# Patient Record
Sex: Male | Born: 1980 | Race: White | Hispanic: No
Health system: Midwestern US, Community
[De-identification: ages and names within clinical notes are randomized; demographics above are authoritative.]

## PROBLEM LIST (undated history)

## (undated) DIAGNOSIS — D6859 Other primary thrombophilia: Secondary | ICD-10-CM

## (undated) DIAGNOSIS — R45851 Suicidal ideations: Secondary | ICD-10-CM

## (undated) DIAGNOSIS — R569 Unspecified convulsions: Secondary | ICD-10-CM

## (undated) HISTORY — PX: INSERTION OF VENA CAVA FILTER: SHX5871

---

## 2005-08-20 ENCOUNTER — Inpatient Hospital Stay: Payer: Self-pay | Admitting: Internal Medicine

## 2005-08-20 ENCOUNTER — Other Ambulatory Visit: Payer: Self-pay

## 2005-08-28 ENCOUNTER — Inpatient Hospital Stay: Payer: Self-pay | Admitting: Psychiatry

## 2005-09-06 ENCOUNTER — Inpatient Hospital Stay: Payer: Self-pay | Admitting: Internal Medicine

## 2012-02-14 ENCOUNTER — Inpatient Hospital Stay: Payer: Self-pay | Admitting: Psychiatry

## 2012-02-15 ENCOUNTER — Inpatient Hospital Stay: Payer: Self-pay | Admitting: Internal Medicine

## 2012-02-15 LAB — COMPREHENSIVE METABOLIC PANEL
Alkaline Phosphatase: 107 U/L (ref 50–136)
BUN: 15 mg/dL (ref 7–18)
Bilirubin,Total: 0.2 mg/dL (ref 0.2–1.0)
Chloride: 109 mmol/L — ABNORMAL HIGH (ref 98–107)
Creatinine: 0.95 mg/dL (ref 0.60–1.30)
Glucose: 109 mg/dL — ABNORMAL HIGH (ref 65–99)
SGOT(AST): 29 U/L (ref 15–37)
SGPT (ALT): 25 U/L (ref 12–78)
Sodium: 142 mmol/L (ref 136–145)
Total Protein: 7.9 g/dL (ref 6.4–8.2)

## 2012-02-15 LAB — CBC WITH DIFFERENTIAL/PLATELET
Basophil #: 0 10*3/uL (ref 0.0–0.1)
Eosinophil #: 0.2 10*3/uL (ref 0.0–0.7)
HCT: 35.7 % — ABNORMAL LOW (ref 40.0–52.0)
Lymphocyte #: 1.3 10*3/uL (ref 1.0–3.6)
Lymphocyte %: 16.6 %
MCHC: 32 g/dL (ref 32.0–36.0)
Monocyte %: 10.5 %
Neutrophil #: 5.5 10*3/uL (ref 1.4–6.5)
Platelet: 284 10*3/uL (ref 150–440)
RDW: 16.5 % — ABNORMAL HIGH (ref 11.5–14.5)

## 2012-02-15 LAB — CK: CK, Total: 193 U/L (ref 35–232)

## 2012-02-15 LAB — TROPONIN I: Troponin-I: <0.02 ng/mL

## 2012-02-16 LAB — URINALYSIS, COMPLETE
Bacteria: NONE SEEN
Blood: NEGATIVE
Leukocyte Esterase: NEGATIVE
Nitrite: NEGATIVE
Ph: 8 (ref 4.5–8.0)
Protein: NEGATIVE
RBC,UR: 9 /HPF (ref 0–5)
Squamous Epithelial: NONE SEEN
WBC UR: 1 /HPF (ref 0–5)

## 2012-02-16 LAB — DRUG SCREEN, URINE
Amphetamines, Ur Screen: NEGATIVE (ref ?–1000)
Benzodiazepine, Ur Scrn: POSITIVE (ref ?–200)
Cocaine Metabolite,Ur ~~LOC~~: POSITIVE (ref ?–300)
MDMA (Ecstasy)Ur Screen: NEGATIVE (ref ?–500)
Tricyclic, Ur Screen: NEGATIVE (ref ?–1000)

## 2012-02-17 ENCOUNTER — Inpatient Hospital Stay: Payer: Self-pay | Admitting: Psychiatry

## 2012-03-07 ENCOUNTER — Inpatient Hospital Stay (HOSPITAL_COMMUNITY)
Admission: EM | Admit: 2012-03-07 | Discharge: 2012-03-18 | DRG: 101 | Disposition: A | Discharge status: Home / Self Care | Payer: MEDICAID | Attending: Family Medicine | Admitting: Family Medicine

## 2012-03-07 ENCOUNTER — Encounter (HOSPITAL_COMMUNITY): Payer: Self-pay | Admitting: *Deleted

## 2012-03-07 ENCOUNTER — Emergency Department (HOSPITAL_COMMUNITY): Payer: Self-pay

## 2012-03-07 DIAGNOSIS — Z765 Malingerer [conscious simulation]: Secondary | ICD-10-CM

## 2012-03-07 DIAGNOSIS — G40901 Epilepsy, unspecified, not intractable, with status epilepticus: Secondary | ICD-10-CM

## 2012-03-07 DIAGNOSIS — R569 Unspecified convulsions: Secondary | ICD-10-CM

## 2012-03-07 DIAGNOSIS — G930 Cerebral cysts: Secondary | ICD-10-CM | POA: Diagnosis present

## 2012-03-07 DIAGNOSIS — G40909 Epilepsy, unspecified, not intractable, without status epilepticus: Secondary | ICD-10-CM

## 2012-03-07 DIAGNOSIS — F1910 Other psychoactive substance abuse, uncomplicated: Secondary | ICD-10-CM | POA: Diagnosis present

## 2012-03-07 DIAGNOSIS — L0390 Cellulitis, unspecified: Secondary | ICD-10-CM

## 2012-03-07 DIAGNOSIS — Z91199 Patient's noncompliance with other medical treatment and regimen due to unspecified reason: Secondary | ICD-10-CM

## 2012-03-07 DIAGNOSIS — IMO0002 Reserved for concepts with insufficient information to code with codable children: Secondary | ICD-10-CM | POA: Diagnosis not present

## 2012-03-07 DIAGNOSIS — Z888 Allergy status to other drugs, medicaments and biological substances status: Secondary | ICD-10-CM

## 2012-03-07 DIAGNOSIS — F1020 Alcohol dependence, uncomplicated: Secondary | ICD-10-CM

## 2012-03-07 DIAGNOSIS — Z885 Allergy status to narcotic agent status: Secondary | ICD-10-CM

## 2012-03-07 DIAGNOSIS — G40401 Other generalized epilepsy and epileptic syndromes, not intractable, with status epilepticus: Principal | ICD-10-CM | POA: Diagnosis present

## 2012-03-07 DIAGNOSIS — D6859 Other primary thrombophilia: Secondary | ICD-10-CM | POA: Diagnosis present

## 2012-03-07 DIAGNOSIS — Z79899 Other long term (current) drug therapy: Secondary | ICD-10-CM

## 2012-03-07 DIAGNOSIS — G894 Chronic pain syndrome: Secondary | ICD-10-CM | POA: Diagnosis present

## 2012-03-07 DIAGNOSIS — I82629 Acute embolism and thrombosis of deep veins of unspecified upper extremity: Secondary | ICD-10-CM | POA: Diagnosis not present

## 2012-03-07 DIAGNOSIS — F17200 Nicotine dependence, unspecified, uncomplicated: Secondary | ICD-10-CM | POA: Diagnosis present

## 2012-03-07 DIAGNOSIS — R52 Pain, unspecified: Secondary | ICD-10-CM

## 2012-03-07 DIAGNOSIS — I82409 Acute embolism and thrombosis of unspecified deep veins of unspecified lower extremity: Secondary | ICD-10-CM

## 2012-03-07 DIAGNOSIS — Z9119 Patient's noncompliance with other medical treatment and regimen: Secondary | ICD-10-CM

## 2012-03-07 HISTORY — DX: Unspecified convulsions: R56.9

## 2012-03-07 HISTORY — DX: Other primary thrombophilia: D68.59

## 2012-03-07 LAB — CBC WITH DIFFERENTIAL/PLATELET
Eosinophils Absolute: 0.1 10*3/uL (ref 0.0–0.7)
Eosinophils Relative: 2 % (ref 0–5)
HCT: 34.6 % — ABNORMAL LOW (ref 39.0–52.0)
Hemoglobin: 10.9 g/dL — ABNORMAL LOW (ref 13.0–17.0)
Lymphs Abs: 0.9 10*3/uL (ref 0.7–4.0)
MCH: 25.2 pg — ABNORMAL LOW (ref 26.0–34.0)
MCV: 80.1 fL (ref 78.0–100.0)
Monocytes Relative: 12 % (ref 3–12)
RBC: 4.32 MIL/uL (ref 4.22–5.81)

## 2012-03-07 LAB — BASIC METABOLIC PANEL
BUN: 16 mg/dL (ref 6–23)
CO2: 23 mEq/L (ref 19–32)
Calcium: 9.1 mg/dL (ref 8.4–10.5)
GFR calc non Af Amer: >90 mL/min (ref >90)
Glucose, Bld: 139 mg/dL — ABNORMAL HIGH (ref 70–99)

## 2012-03-07 LAB — RAPID URINE DRUG SCREEN, HOSP PERFORMED
Amphetamines: NOT DETECTED
Barbiturates: NOT DETECTED
Cocaine: NOT DETECTED
Tetrahydrocannabinol: NOT DETECTED

## 2012-03-07 LAB — GLUCOSE, CAPILLARY: Glucose-Capillary: 144 mg/dL — ABNORMAL HIGH (ref 70–99)

## 2012-03-07 MED ORDER — ACETAMINOPHEN 500 MG PO TABS
1000.0000 mg | ORAL_TABLET | Freq: Once | ORAL | Status: DC
  Filled 2012-03-07: qty 2

## 2012-03-07 MED ORDER — ROCURONIUM BROMIDE 50 MG/5ML IV SOLN: 0.6000 mg/kg | Freq: Once | INTRAVENOUS | Status: DC

## 2012-03-07 MED ORDER — LORAZEPAM 2 MG/ML IJ SOLN
2.0000 mg | Freq: Once | INTRAMUSCULAR | Status: AC
  Administered 2012-03-07 (×2): 2 mg via INTRAVENOUS

## 2012-03-07 MED ORDER — ONDANSETRON HCL 4 MG/2ML IJ SOLN
4.0000 mg | Freq: Once | INTRAMUSCULAR | Status: AC
  Administered 2012-03-07: 4 mg via INTRAVENOUS
  Filled 2012-03-07: qty 2

## 2012-03-07 MED ORDER — LORAZEPAM 2 MG/ML IJ SOLN: 2.0000 mg | INTRAMUSCULAR | Status: DC | PRN

## 2012-03-07 MED ORDER — PHENYTOIN SODIUM EXTENDED 100 MG PO CAPS
200.0000 mg | ORAL_CAPSULE | Freq: Once | ORAL | Status: AC
  Administered 2012-03-07: 200 mg via ORAL
  Filled 2012-03-07: qty 2

## 2012-03-07 MED ORDER — LORAZEPAM 2 MG/ML IJ SOLN
INTRAMUSCULAR | Status: AC
  Administered 2012-03-07: 2 mg via INTRAVENOUS
  Filled 2012-03-07: qty 1

## 2012-03-07 MED ORDER — SODIUM CHLORIDE 0.9 % IV SOLN
1000.0000 mg | INTRAVENOUS | Status: AC
  Administered 2012-03-07: 1000 mg via INTRAVENOUS
  Filled 2012-03-07: qty 10

## 2012-03-07 MED ORDER — HYDROMORPHONE HCL PF 1 MG/ML IJ SOLN
0.5000 mg | Freq: Once | INTRAMUSCULAR | Status: AC
  Administered 2012-03-07: 0.5 mg via INTRAVENOUS
  Filled 2012-03-07: qty 1

## 2012-03-07 MED ORDER — HYDROMORPHONE HCL PF 1 MG/ML IJ SOLN
1.0000 mg | Freq: Once | INTRAMUSCULAR | Status: AC
  Administered 2012-03-07: 1 mg via INTRAVENOUS
  Filled 2012-03-07: qty 1

## 2012-03-07 MED ORDER — ETOMIDATE 2 MG/ML IV SOLN: 20.0000 mg | Freq: Once | INTRAVENOUS | Status: DC

## 2012-03-07 MED ORDER — LORAZEPAM 2 MG/ML IJ SOLN: 2.0000 mg | Freq: Once | INTRAMUSCULAR | Status: DC

## 2012-03-07 NOTE — ED Provider Notes (Signed)
Pt with multiple seizures, not responding to PTN/keppra/benzos He started to seizure again while in CT imaging Will likely need airway management due to status epilepticus   Joya Gaskins, MD 03/07/12 2008

## 2012-03-07 NOTE — ED Provider Notes (Signed)
History     CSN: [id number]  Arrival date & time 03/07/12  1752   First MD Initiated Contact with Patient 03/07/12 1754      Chief Complaint  Patient presents with  . Seizures    (Consider location/radiation/quality/duration/timing/severity/associated sxs/prior treatment) HPI Comments: Patient reported to have hx of etoh abuse,  Released from rehab 1 week ago for narc abuse.  He was out with friends today and reported to have witnessed seizure lasting 1 min.  Patient found to be post ictal.  Patient had seizure, full body clonic/tonic lasting 30 sec with ems. Patient had 3 rd seizure lasting 30 sec,  He received 2.5mg  versed iv.  Patient had 4th seizure, same description.  Patient received additional 2.5mg  versed.  Patient did have incont of urine.  Patient has not had any additional seizures.  He also received 1mg  narcan w/o response.  No s/sx of trauma.  Patient has known hx of seizures.  Patient states he thinks he took seizure med late today.  Cbg 124  Patient is a 31 y.o. male presenting with seizures. The history is provided by the patient and the EMS personnel. No language interpreter was used.  Seizures  This is a recurrent problem. The current episode started less than 1 hour ago. The problem has been resolved. There were 4 to 5 seizures. The most recent episode lasted 30 to 120 seconds. Associated symptoms include patient experiences confusion. Pertinent negatives include no speech difficulty, no visual disturbance, no sore throat, no chest pain, no cough, no nausea, no vomiting and no diarrhea. Characteristics include bladder incontinence, rhythmic jerking and loss of consciousness. The episode was witnessed. The seizures did not continue in the ED. The seizure(s) had no focality. Possible causes include med or dosage change (states he took his meds late). Possible causes do not include change in alcohol use (last drink last night). There has been no fever. Medications administered  prior to arrival include diazepam IV.    History reviewed. No pertinent past medical history.  No past surgical history on file.  No family history on file.  History  Substance Use Topics  . Smoking status: Not on file  . Smokeless tobacco: Not on file  . Alcohol Use: Not on file      Review of Systems  Constitutional: Negative for diaphoresis, activity change and appetite change.  HENT: Negative for sore throat and neck pain.   Eyes: Negative for discharge and visual disturbance.  Respiratory: Negative for cough, choking and shortness of breath.   Cardiovascular: Negative for chest pain and leg swelling.  Gastrointestinal: Negative for nausea, vomiting, abdominal pain, diarrhea and constipation.  Genitourinary: Positive for bladder incontinence. Negative for dysuria and difficulty urinating.  Musculoskeletal: Negative for back pain and arthralgias.  Skin: Negative for color change and pallor.  Neurological: Positive for seizures and loss of consciousness. Negative for dizziness, speech difficulty and light-headedness.  Psychiatric/Behavioral: Positive for confusion. Negative for behavioral problems and agitation.  All other systems reviewed and are negative.    Allergies  Review of patient's allergies indicates not on file.  Home Medications  No current outpatient prescriptions on file.  There were no vitals taken for this visit.  Physical Exam  Constitutional: He is oriented to person, place, and time. He appears well-developed. No distress.  HENT:  Head: Normocephalic and atraumatic.  Mouth/Throat: No oropharyngeal exudate.  Eyes: EOM are normal. Pupils are equal, round, and reactive to light. Right eye exhibits no discharge. Left eye exhibits  no discharge.  Neck: Normal range of motion. Neck supple. No JVD present.  Cardiovascular: Normal rate, regular rhythm and normal heart sounds.   Pulmonary/Chest: Effort normal and breath sounds normal. No stridor. No  respiratory distress. He has no wheezes. He has no rales. He exhibits no tenderness.  Abdominal: Soft. Bowel sounds are normal. There is no tenderness. There is no guarding.  Genitourinary: Penis normal.  Musculoskeletal: Normal range of motion. He exhibits no edema and no tenderness.  Neurological: He is alert and oriented to person, place, and time. No cranial nerve deficit. He exhibits normal muscle tone.       Initially drowsy  Skin: Skin is warm and dry. He is not diaphoretic. No erythema. No pallor.  Psychiatric: He has a normal mood and affect. His behavior is normal. Judgment and thought content normal.    ED Course  Procedures (including critical care time)  Labs Reviewed  CBC WITH DIFFERENTIAL - Abnormal; Notable for the following:    Hemoglobin 10.9 (*)     HCT 34.6 (*)     MCH 25.2 (*)     All other components within normal limits  BASIC METABOLIC PANEL - Abnormal; Notable for the following:    Glucose, Bld 139 (*)     All other components within normal limits  PHENYTOIN LEVEL, TOTAL - Abnormal; Notable for the following:    Phenytoin Lvl 8.4 (*)     All other components within normal limits  GLUCOSE, CAPILLARY - Abnormal; Notable for the following:    Glucose-Capillary 144 (*)     All other components within normal limits  URINE RAPID DRUG SCREEN (HOSP PERFORMED) - Abnormal; Notable for the following:    Opiates POSITIVE (*)     Benzodiazepines POSITIVE (*)     All other components within normal limits  ETHANOL  GLUCOSE, CAPILLARY  HEPATIC FUNCTION PANEL   Ct Head Wo Contrast  03/07/2012  *RADIOLOGY REPORT*  Clinical Data: Seizure  CT HEAD WITHOUT CONTRAST  Technique:  Contiguous axial images were obtained from the base of the skull through the vertex without contrast.  Comparison: None.  Findings:  Ventricles are normal in size and configuration. There is a 3.2 x 1.1 cm arachnoid cyst in the anterior left temporal region.  There is no other mass.  There is no   hemorrhage, extra- axial fluid collection, or midline shift. Gray-white compartments are normal.  Bony calvarium appears intact.  The mastoid air cells are clear.  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  Study otherwise unremarkable.   Original Report Authenticated By: Bretta Bang, M.D.      1. Seizure     Angiocath insertion Performed by: Warrick Parisian  Consent: Verbal consent obtained. Risks and benefits: risks, benefits and alternatives were discussed Time out: Immediately prior to procedure a "time out" was called to verify the correct patient, procedure, equipment, support staff and site/side marked as required.  Preparation: Patient was prepped and draped in the usual sterile fashion.  Vein Location: R AC  Ultrasound Guided  Gauge: 20  Normal blood return and flush without difficulty Patient tolerance: Patient tolerated the procedure well with no immediate complications.     MDM  6:16 PM pt just had another GTC seizure, gaze to R side.O2 100% on NRB. Responded to 2mg  ativan.  7:46 PM patient is alert, complains of severe headache and states that he wants to leave because we are not treating his headache. I  told him that we would treat his pain and the patient agreed to stay. Awaiting CT scan at this time. Phenytoin level low, states he is skipped his past 3 doses. Getting Keppra IV, will also load with oral Dilantin. Stable.  8:32 PM seized again. Will load w/ phenytoin.  Admitted to medicine step down in stable condition. Awaiting albumin to appropriately dose phenytoin load. No further seizures. CTH nml    Warrick Parisian, MD 03/07/12 [phone]

## 2012-03-07 NOTE — ED Notes (Signed)
Patient is alert, states he wants to go home.  Complains of headache.  ERMD aware and medication given per orders.  Keppra bolus has completed.  Patient to have CT scan due to headache

## 2012-03-07 NOTE — ED Notes (Signed)
Patient is upset regarding his headache.  Patient states he wants to sign out ama.  Advised that he should not leave due to unsafe with recent episodes of seizures.   Dr Bebe Shaggy has been informed

## 2012-03-07 NOTE — ED Notes (Addendum)
Patient reported to have hx of etoh abuse,  Released from rehab on yesterday.  He was out with friends today and reported to have witnessed seizure lasting 1 min.  Patient found to be post ictal.  Patient had seizure, full body clonic/tonic lasting 30 sec with ems.  Patient had 3 rd seizure lasting 30 sec,  He received 2.5mg  versed iv.  Patient had 4th seizure, same description.  Patient received additional 2.5mg  versed.  Patient did have incont of urine.  Patient has not had any additional seizures.  He also received 1mg  narcan.   No s/sx of trauma.  Patient has known hx of seizures.  Patient states he thinks he took seizure med late today.  Cbg 124

## 2012-03-07 NOTE — ED Provider Notes (Signed)
I have personally seen and examined the patient.  I have discussed the plan of care with the resident.  I have reviewed the documentation on PMH/FH/Soc. History.  I have reviewed the documentation of the resident and agree.    Date: 03/07/2012  Rate: 93  Rhythm: normal sinus rhythm  QRS Axis: normal  Intervals: normal  ST/T Wave abnormalities: nonspecific ST changes  Conduction Disutrbances:none   CRITICAL CARE Performed by: Joya Gaskins   Total critical care time: 40  Critical care time was exclusive of separately billable procedures and treating other patients.  Critical care was necessary to treat or prevent imminent or life-threatening deterioration.  Critical care was time spent personally by me on the following activities: development of treatment plan with patient and/or surrogate as well as nursing, discussions with consultants, evaluation of patient's response to treatment, examination of patient, obtaining history from patient or surrogate, ordering and performing treatments and interventions, ordering and review of laboratory studies, ordering and review of radiographic studies, pulse oximetry and re-evaluation of patient's condition.  Pt checked on frequently, multiple seizures and was nearly intubated but came back to baseline after multiple doses of ativan.  Given keppra and PTN Admit to stepdown   Joya Gaskins, MD 03/07/12 2339

## 2012-03-07 NOTE — H&P (Signed)
Chief Complaint:  seizure  HPI: 31 yo male with known h/o sz disorder since early childhood normally on dilantin and keppra comes in with mult seizures.  In ED had at least 6 witnessed seizures.  Pt states he missed one dose of his meds last night.  Says he has not had any seizures in over a year.  Denies any fevers/n/v/d.  No abd pain no rashes.  Drinks etoh regularly claims about a 6 pack a day at least 3 times a week.  He has been loaded with keppra and several doses of ativan in ED and has not had a seizure in about an hour.  Can talk in full sentences right now and answering questions appropriately but throughout interview is mainly concerned about getting more dilaudid iv.  He bite the inside of his cheek.  Review of Systems:  O/w neg  Past Medical History: Past Medical History  Diagnosis Date  . Seizure   . Protein C deficiency    Past Surgical History  Procedure Date  . Insertion of vena cava filter     Medications: Prior to Admission medications   Medication Sig Start Date End Date Taking? Authorizing Provider  levETIRAcetam (KEPPRA) 1000 MG tablet Take 1,000 mg by mouth 2 (two) times daily.   Yes Historical Provider, MD  phenytoin (DILANTIN) 100 MG ER capsule Take 200 mg by mouth 2 (two) times daily.   Yes Historical Provider, MD    Allergies:   Allergies  Allergen Reactions  . Depakote (Divalproex Sodium)   . Fish Allergy   . Morphine And Related     Social History:  reports that he has been smoking.  He does not have any smokeless tobacco history on file. He reports that he drinks alcohol. He reports that he uses illicit drugs (Marijuana).  Family History: negavite  Physical Exam: Filed Vitals:   03/07/12 1806 03/07/12 1807 03/07/12 2012 03/07/12 2240  BP:  147/87 118/60 131/81  Pulse:   86 54  Temp:  99.5 F (37.5 C) 98.5 F (36.9 C) 98.1 F (36.7 C)  TempSrc:  Oral Oral Oral  Resp:  16 18 18   Height: 5\' 8"  (1.727 m)     Weight: 72.576 kg (160 lb)      SpO2:  99% 100% 95%   General appearance: alert, cooperative and no distress Lungs: clear to auscultation bilaterally Heart: regular rate and rhythm, S1, S2 normal, no murmur, click, rub or gallop Abdomen: soft, non-tender; bowel sounds normal; no masses,  no organomegaly Extremities: extremities normal, atraumatic, no cyanosis or edema Pulses: 2+ and symmetric Skin: Skin color, texture, turgor normal. No rashes or lesions Neurologic: Grossly normal    Labs on Admission:   [facility] 03/07/12 1809  NA 139  K 3.8  CL 105  CO2 23  GLUCOSE 139*  BUN 16  CREATININE 0.87  CALCIUM 9.1  MG --  PHOS --    Basename 03/07/12 1809  WBC 6.9  NEUTROABS 5.0  HGB 10.9*  HCT 34.6*  MCV 80.1  PLT 250   Radiological Exams on Admission: Ct Head Wo Contrast  03/07/2012  *RADIOLOGY REPORT*  Clinical Data: Seizure  CT HEAD WITHOUT CONTRAST  Technique:  Contiguous axial images were obtained from the base of the skull through the vertex without contrast.  Comparison: None.  Findings:  Ventricles are normal in size and configuration. There is a 3.2 x 1.1 cm arachnoid cyst in the anterior left temporal region.  There is no other mass.  There is no  hemorrhage, extra- axial fluid collection, or midline shift. Gray-white compartments are normal.  Bony calvarium appears intact.  The mastoid air cells are clear.  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  Study otherwise unremarkable.   Original Report Authenticated By: Bretta Bang, M.D.     Assessment/Plan Present on Admission:  31 yo male with uncontrolled seizure likely due to noncompliance and ?etoh use .Status epilepticus, generalized convulsive .Seizure disorder .EtOH dependence .Drug-seeking behavior  Place in stepdown overnight.  Ativan prn.  Neuro called for consultation already.  keppra iv and dilantin po.  Ct head with cyst ?benign await neuro recs.  Limit controlled substances  as to not confuse clinical picture.  ??monitor for any withdrawal from etoh.  Full code.  Currently stable. Evonne Rinks A 03/07/2012, 11:39 PM\

## 2012-03-08 ENCOUNTER — Encounter (HOSPITAL_COMMUNITY): Payer: Self-pay | Admitting: *Deleted

## 2012-03-08 ENCOUNTER — Inpatient Hospital Stay (HOSPITAL_COMMUNITY): Payer: Self-pay

## 2012-03-08 DIAGNOSIS — F1910 Other psychoactive substance abuse, uncomplicated: Secondary | ICD-10-CM

## 2012-03-08 DIAGNOSIS — I6350 Cerebral infarction due to unspecified occlusion or stenosis of unspecified cerebral artery: Secondary | ICD-10-CM

## 2012-03-08 DIAGNOSIS — R569 Unspecified convulsions: Secondary | ICD-10-CM

## 2012-03-08 DIAGNOSIS — F1020 Alcohol dependence, uncomplicated: Secondary | ICD-10-CM

## 2012-03-08 DIAGNOSIS — G40401 Other generalized epilepsy and epileptic syndromes, not intractable, with status epilepticus: Principal | ICD-10-CM

## 2012-03-08 DIAGNOSIS — G40909 Epilepsy, unspecified, not intractable, without status epilepticus: Secondary | ICD-10-CM

## 2012-03-08 LAB — COMPREHENSIVE METABOLIC PANEL
AST: 14 U/L (ref 0–37)
Albumin: 3.3 g/dL — ABNORMAL LOW (ref 3.5–5.2)
BUN: 14 mg/dL (ref 6–23)
Calcium: 8.4 mg/dL (ref 8.4–10.5)
Chloride: 105 mEq/L (ref 96–112)
Creatinine, Ser: 0.82 mg/dL (ref 0.50–1.35)
Total Bilirubin: 0.1 mg/dL — ABNORMAL LOW (ref 0.3–1.2)
Total Protein: 6.2 g/dL (ref 6.0–8.3)

## 2012-03-08 LAB — PROTIME-INR
INR: 1.32 (ref 0.00–1.49)
Prothrombin Time: 16.1 seconds — ABNORMAL HIGH (ref 11.6–15.2)

## 2012-03-08 LAB — HEPATIC FUNCTION PANEL
AST: 20 U/L (ref 0–37)
Albumin: 4 g/dL (ref 3.5–5.2)
Alkaline Phosphatase: 90 U/L (ref 39–117)
Total Bilirubin: 0.2 mg/dL — ABNORMAL LOW (ref 0.3–1.2)
Total Protein: 7.2 g/dL (ref 6.0–8.3)

## 2012-03-08 LAB — GLUCOSE, CAPILLARY: Glucose-Capillary: 122 mg/dL — ABNORMAL HIGH (ref 70–99)

## 2012-03-08 LAB — CBC WITH DIFFERENTIAL/PLATELET
Basophils Absolute: 0 10*3/uL (ref 0.0–0.1)
Basophils Relative: 1 % (ref 0–1)
Eosinophils Absolute: 0.2 10*3/uL (ref 0.0–0.7)
HCT: 34 % — ABNORMAL LOW (ref 39.0–52.0)
MCH: 25.4 pg — ABNORMAL LOW (ref 26.0–34.0)
MCHC: 31.2 g/dL (ref 30.0–36.0)
Monocytes Absolute: 0.8 10*3/uL (ref 0.1–1.0)
Neutro Abs: 3.4 10*3/uL (ref 1.7–7.7)
RDW: 15.4 % (ref 11.5–15.5)

## 2012-03-08 LAB — MRSA PCR SCREENING
MRSA by PCR: NEGATIVE
MRSA by PCR: NEGATIVE

## 2012-03-08 LAB — APTT: aPTT: 30 seconds (ref 24–37)

## 2012-03-08 LAB — LACTIC ACID, PLASMA: Lactic Acid, Venous: 1 mmol/L (ref 0.5–2.2)

## 2012-03-08 MED ORDER — SODIUM CHLORIDE 0.9 % IV SOLN
1000.0000 mg | Freq: Two times a day (BID) | INTRAVENOUS | Status: DC
  Administered 2012-03-08: 1000 mg via INTRAVENOUS
  Filled 2012-03-08 (×3): qty 10

## 2012-03-08 MED ORDER — TRAMADOL HCL 50 MG PO TABS
50.0000 mg | ORAL_TABLET | Freq: Four times a day (QID) | ORAL | Status: DC | PRN
  Administered 2012-03-08 – 2012-03-14 (×4): 50 mg via ORAL
  Filled 2012-03-08 (×5): qty 1

## 2012-03-08 MED ORDER — ONDANSETRON HCL 4 MG/2ML IJ SOLN: 4.0000 mg | Freq: Four times a day (QID) | INTRAMUSCULAR | Status: DC | PRN

## 2012-03-08 MED ORDER — HYDROMORPHONE HCL PF 1 MG/ML IJ SOLN
1.0000 mg | INTRAMUSCULAR | Status: DC | PRN
  Administered 2012-03-08 (×2): 1 mg via INTRAVENOUS
  Filled 2012-03-08 (×2): qty 1

## 2012-03-08 MED ORDER — SODIUM CHLORIDE 0.9 % IV SOLN
INTRAVENOUS | Status: DC
  Administered 2012-03-08 (×2): via INTRAVENOUS

## 2012-03-08 MED ORDER — FOLIC ACID 1 MG PO TABS
1.0000 mg | ORAL_TABLET | Freq: Every day | ORAL | Status: DC
  Administered 2012-03-08 – 2012-03-17 (×9): 1 mg via ORAL
  Filled 2012-03-08 (×11): qty 1

## 2012-03-08 MED ORDER — ACETAMINOPHEN 325 MG PO TABS: 650.0000 mg | ORAL_TABLET | Freq: Four times a day (QID) | ORAL | Status: DC | PRN

## 2012-03-08 MED ORDER — THIAMINE HCL 100 MG/ML IJ SOLN
100.0000 mg | Freq: Every day | INTRAMUSCULAR | Status: DC
  Filled 2012-03-08 (×3): qty 1

## 2012-03-08 MED ORDER — THIAMINE HCL 100 MG/ML IJ SOLN
Freq: Once | INTRAVENOUS | Status: AC
  Administered 2012-03-08: 17:00:00 via INTRAVENOUS
  Filled 2012-03-08: qty 1000

## 2012-03-08 MED ORDER — FENTANYL CITRATE 0.05 MG/ML IJ SOLN
25.0000 ug | INTRAMUSCULAR | Status: DC | PRN
  Administered 2012-03-08 – 2012-03-09 (×14): 25 ug via INTRAVENOUS
  Filled 2012-03-08 (×11): qty 2
  Filled 2012-03-08: qty 4
  Filled 2012-03-08 (×2): qty 2

## 2012-03-08 MED ORDER — ALUM & MAG HYDROXIDE-SIMETH 200-200-20 MG/5ML PO SUSP: 30.0000 mL | Freq: Four times a day (QID) | ORAL | Status: DC | PRN

## 2012-03-08 MED ORDER — PANTOPRAZOLE SODIUM 40 MG IV SOLR
40.0000 mg | INTRAVENOUS | Status: DC
  Filled 2012-03-08 (×2): qty 40

## 2012-03-08 MED ORDER — FENTANYL CITRATE 0.05 MG/ML IJ SOLN
25.0000 ug | Freq: Once | INTRAMUSCULAR | Status: AC
  Administered 2012-03-08: 25 ug via INTRAVENOUS

## 2012-03-08 MED ORDER — SODIUM CHLORIDE 0.9 % IV SOLN: 15.0000 mg/kg | INTRAVENOUS | Status: DC

## 2012-03-08 MED ORDER — ACETAMINOPHEN 650 MG RE SUPP: 650.0000 mg | Freq: Four times a day (QID) | RECTAL | Status: DC | PRN

## 2012-03-08 MED ORDER — PHENYTOIN SODIUM EXTENDED 100 MG PO CAPS
200.0000 mg | ORAL_CAPSULE | Freq: Two times a day (BID) | ORAL | Status: DC
  Administered 2012-03-08 – 2012-03-17 (×20): 200 mg via ORAL
  Filled 2012-03-08 (×22): qty 2

## 2012-03-08 MED ORDER — LORAZEPAM 2 MG/ML IJ SOLN
1.0000 mg | Freq: Four times a day (QID) | INTRAMUSCULAR | Status: DC | PRN
  Administered 2012-03-08 (×2): 2 mg via INTRAVENOUS
  Administered 2012-03-08 (×2): 1 mg via INTRAVENOUS
  Filled 2012-03-08 (×3): qty 1

## 2012-03-08 MED ORDER — DEXTROSE-NACL 5-0.9 % IV SOLN
INTRAVENOUS | Status: DC
  Administered 2012-03-08: 125 mL/h via INTRAVENOUS

## 2012-03-08 MED ORDER — SODIUM CHLORIDE 0.9 % IV SOLN
700.0000 mg | INTRAVENOUS | Status: AC
  Administered 2012-03-08: 700 mg via INTRAVENOUS
  Filled 2012-03-08: qty 14

## 2012-03-08 MED ORDER — HEPARIN SODIUM (PORCINE) 5000 UNIT/ML IJ SOLN
5000.0000 [IU] | Freq: Three times a day (TID) | INTRAMUSCULAR | Status: DC
  Filled 2012-03-08 (×12): qty 1

## 2012-03-08 MED ORDER — LORAZEPAM 1 MG PO TABS: 1.0000 mg | ORAL_TABLET | Freq: Four times a day (QID) | ORAL | Status: DC | PRN

## 2012-03-08 MED ORDER — HYDROMORPHONE HCL PF 1 MG/ML IJ SOLN
0.5000 mg | INTRAMUSCULAR | Status: DC | PRN
  Administered 2012-03-08: 0.5 mg via INTRAVENOUS
  Filled 2012-03-08: qty 1

## 2012-03-08 MED ORDER — LIDOCAINE VISCOUS 2 % MT SOLN
20.0000 mL | Freq: Four times a day (QID) | OROMUCOSAL | Status: DC | PRN
  Filled 2012-03-08 (×2): qty 20

## 2012-03-08 MED ORDER — VITAMIN B-1 100 MG PO TABS
100.0000 mg | ORAL_TABLET | Freq: Every day | ORAL | Status: DC
  Administered 2012-03-08 – 2012-03-17 (×10): 100 mg via ORAL
  Filled 2012-03-08 (×11): qty 1

## 2012-03-08 MED ORDER — INSULIN ASPART 100 UNIT/ML ~~LOC~~ SOLN: 0.0000 [IU] | SUBCUTANEOUS | Status: DC

## 2012-03-08 MED ORDER — ONDANSETRON HCL 4 MG PO TABS: 4.0000 mg | ORAL_TABLET | Freq: Four times a day (QID) | ORAL | Status: DC | PRN

## 2012-03-08 MED ORDER — SODIUM CHLORIDE 0.9 % IV SOLN
1500.0000 mg | Freq: Two times a day (BID) | INTRAVENOUS | Status: DC
  Administered 2012-03-08 – 2012-03-10 (×5): 1500 mg via INTRAVENOUS
  Filled 2012-03-08 (×9): qty 15

## 2012-03-08 MED ORDER — LORAZEPAM 2 MG/ML IJ SOLN
1.0000 mg | Freq: Two times a day (BID) | INTRAMUSCULAR | Status: DC
  Administered 2012-03-08: 1 mg via INTRAVENOUS
  Filled 2012-03-08: qty 1
  Filled 2012-03-08: qty 2
  Filled 2012-03-08: qty 1

## 2012-03-08 MED ORDER — SODIUM CHLORIDE 0.9 % IV SOLN: INTRAVENOUS | Status: DC

## 2012-03-08 MED ORDER — FENTANYL CITRATE 0.05 MG/ML IJ SOLN
INTRAMUSCULAR | Status: AC
  Administered 2012-03-08: 25 ug via INTRAVENOUS
  Filled 2012-03-08: qty 2

## 2012-03-08 MED ORDER — ADULT MULTIVITAMIN W/MINERALS CH
1.0000 | ORAL_TABLET | Freq: Every day | ORAL | Status: DC
  Administered 2012-03-08: 1 via ORAL
  Filled 2012-03-08 (×2): qty 1

## 2012-03-08 NOTE — Progress Notes (Signed)
Called by nurse to report 2 seizures by patient.  Will give ativan.  Paged Neuro, appreciate their assistance.  Marlin Canary DO

## 2012-03-08 NOTE — Progress Notes (Signed)
VASCULAR LAB PRELIMINARY  PRELIMINARY  PRELIMINARY  PRELIMINARY  Bilateral lower extremity venous Dopplers completed.    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  No acute DVT or SVT noted.    [name] [name], 03/08/2012, 5:05 PM

## 2012-03-08 NOTE — Consult Note (Addendum)
Reason for Consult: Status epilepticus Referring Physician: Marlin Canary  CC: Seizures  History is obtained from: Nursing staff, treating physician, later the patient  HPI: [name] [name] is a 31 y.o. male was admitted with breakthrough seizures yesterday and was restarted on his regular medications. On admission his Dilantin level was 8.4, and it since received 2 doses of Dilantin. He typically takes 200 mg twice a day. He skiped several doses of his medication on days prior to admission. Today, he was transferred from the step down unit to the floor bed. He was seen to have seizure activity, and that point I was consulted.   On arrival, the patient had been having intermittent seizure activity for the past 15 minutes. He would have 20-30 seconds of seizure activity, followed by 20-30 seconds of unresponsiveness without active shaking. He had been given 1 mg of Ativan, I gave him an additional 2 mg and he continued to have seizure, so a further 2 mg are given with cessation of seizure activity.   He susbsequently recovered to the point of being able to converse.    ROS: Able to obtain due to 2 seizure  Past Medical History  Diagnosis Date  . Seizure   . Protein C deficiency   . Protein C deficiency     Family History: unAble to obtain due to seizure  Social History: Tob: Unable to obtain due to seizure EtOH was discussed. He reports only a single night of drinkign in the past month(last friday) when he had 4 shots and 6 beers.   Exam: Current vital signs: BP 113/72  Pulse 75  Temp 97.8 F (36.6 C) (Oral)  Resp 15  Ht 5\' 8"  (1.727 m)  Wt 71.3 kg (157 lb 3 oz)  BMI 23.90 kg/m2  SpO2 97% Vital signs in last 24 hours: Temp:  [97.5 F (36.4 C)-99.5 F (37.5 C)] 97.8 F (36.6 C) (11/03 1255) Pulse Rate:  [43-86] 75  (11/03 1700) Resp:  [13-20] 15  (11/03 1700) BP: (106-147)/(56-87) 113/72 mmHg (11/03 1700) SpO2:  [95 %-100 %] 97 % (11/03 1700) Weight:  [71.3 kg (157 lb 3  oz)-72.576 kg (160 lb)] 71.3 kg (157 lb 3 oz) (11/03 0030)  General: NAD Initiallly pateitne was having generalized tonic-clonic activity when I entered the room. This activty ceased for 20 -30 seconds, following which he began once again seizing. He had right eye deviation just prior to this.   Once the seizure was aborted, the patient slowly returned to baseline, initally unable to respond, but subsequently following commands and finally able to converse. At that point, he had no field cut or focal weakness. He endorses symmetric sensation to light touch. Toes are downgoing.   I have reviewed labs in epic and the results pertinent to this consultation are: CMP unremarkable. CBC mild anemia  I have reviewed the images obtained:CT head 11/02 negative.   Impression: 31 yo M with seizure disorder who was non-compliant with his medications and has had subsequent breakthrough seizures. If he is being truthful re: his drinking, then he is not at high risk for withdrawel, but given a history of heavy drinking, continuing CIWA is reasonable.   Recommendations: 1) Load with dilantin 10 PE/kg 2) Increase keppra to 1500mg  BId  This patient was critically ill and at significant risk of neurological worsening, death and care requires constant monitoring of vital signs, hemodynamics,respiratory and cardiac monitoring, neurological assessment, discussion with family, other specialists and medical decision making of high complexity. I  spent 35 minutes of neurocritical care time  in the care of  this patient.  Ritta Slot, MD [facility] [phone]  If 7pm- 7am, please page neurology on call at [phone].  03/08/2012  5:51 PM

## 2012-03-08 NOTE — Consult Note (Signed)
Name: [name] [name] MRN: [id number] DOB: 1981/03/22    LOS: 1  REFERRING PRIVIDER:  Traid CHIEF COMPLAINT:  Seizures, airway protection   Brief patient description: 30 yr old ETOH, seizure d/o, presents seizures on floor concern airway protectyion.  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 In ED had at least 6 witnessed seizures. Pt states he missed one dose of his meds last night. Says he has not had any seizures in over a year. Denies any fevers/n/v/d.Drinks etoh regularly. Loaded keppra, to floor. Continued seizures multiple requiring Ativan 5 mg. Became post ictal and unresponsive, called to asst,    PAST MEDICAL HISTORY :  Past Medical History  Diagnosis Date  . Seizure   . Protein C deficiency   . Protein C deficiency    Past Surgical History  Procedure Date  . Insertion of vena cava filter    Prior to Admission medications   Medication Sig Start Date End Date Taking? Authorizing Provider  levETIRAcetam (KEPPRA) 1000 MG tablet Take 1,000 mg by mouth 2 (two) times daily.   Yes Historical Provider, MD  phenytoin (DILANTIN) 100 MG ER capsule Take 200 mg by mouth 2 (two) times daily.   Yes Historical Provider, MD   Allergies  Allergen Reactions  . Depakote (Divalproex Sodium)   . Fish Allergy   . Morphine And Related     FAMILY HISTORY:  History reviewed. No pertinent family history. SOCIAL HISTORY:  reports that he has been smoking Cigarettes.  He has been smoking about .25 packs per day. He does not have any smokeless tobacco history on file. He reports that he drinks about 3.6 ounces of alcohol per  week. He reports that he uses illicit drugs (Marijuana).  REVIEW OF SYSTEMS:  Unabe to obtain as unresponsive   Interval/ subjective:   Vital Signs: Temp:  [97.5 F (36.4 C)-99.5 F (37.5 C)] 97.8 F (36.6 C) (11/03 1255) Pulse Rate:  [43-86] 66  (11/03 1255) Resp:  [13-20] 17  (11/03 1255) BP: (106-147)/(56-87) 132/71 mmHg (11/03 1255) SpO2:  [95 %-100 %] 99 % (11/03 1255) Weight:  [71.3 kg (157 lb 3 oz)-72.576 kg (160 lb)] 71.3 kg (157 lb 3 oz) (11/03 0030)  Physical Examination: General:  Lethargic, unresponsive Neuro:  Not following commands, moves upper ext to pinch HEENT:  tatoo Cardiovascular:  s1 s2 RRT Lungs:  ronchi mild Abdomen:  Soft, BS wnl no r Musculoskeletal:  tatoo Skin:  tatoo multiple  DIAGNOSES: Principal Problem:  *Status epilepticus, generalized convulsive Active Problems:  Seizure disorder  EtOH dependence  Drug-seeking behavior   ASSESSMENT / PLAN: PULMONARY No results found for this basename: PHART:3,PCO2ART:3,PO2ART:3,HCO3:3,O2SAT:3 in the last 168 hours  Ventilator Settings:   CXR:  pending   A:  R/o type 4 resp failure P:   STAT pcxr abg assessment Frequent evaluation for need ETT Does not require as of now Suction if needed Hope treatment seizure easier as appears sub there   CARDIOVASCULAR No results found for this basename: TROPONIN:3,LATICACIDVEN:3,O2SATVEN:3,PROBNP:3 in the last 168 hours ECG:    A: Tachy P:  Treat seizures Tele Fluid to continue  RENAL  Lab 03/07/12 1809  NA 139  K 3.8  CL 105  CO2 23  BUN 16  CREATININE 0.87  CALCIUM 9.1  MG --  PHOS --   Intake/Output      11/02 0701 - 11/03 0700 11/03 0701 - 11/04 0700   I.V. (mL/kg) 500 (7) 488.3 (6.8)   IV Piggyback 110    Total Intake(mL/kg) 610 (8.6) 488.3 (6.8)   Urine (mL/kg/hr) 300 (0.2) 525 (0.9)   Total Output 300 525   Net +310 -36.7          A:  No recent labs P:   Assess lactic, bicarb STAT NACL 100 cc/hr bannana  bag  GASTROINTESTINAL  Lab 03/07/12 2349  AST 20  ALT 11  ALKPHOS 90  PROT 7.2  ALBUMIN 4.0    A:  At risk ulcers / stress  P:   Npo lft ppi  HEMATOLOGIC  Lab 03/07/12 1809  HGB 10.9*  HCT 34.6*  PLT 250  INR --  APTT --    A:  H/o PROT c def, has a filter however?, accuracy of dx in ? P:  At risk cva, low threshold repeat head CT I am not convinced of the dx prot c Repeat prot c without clot burden or hep now Pt, ptt now Add sub q hep ( he stated got filter as bleed on coumadin) Doppler legs Cbc now  INFECTIOUS  Lab 03/07/12 1809  PROCALCITON --  WBC 6.9  LATICACIDVEN --    A:  No infection noted P:   monitor  ENDOCRINE CBG (last 3)   Lab 03/07/12 2009 03/07/12 1816  GLUCAP 92 144*    A:  normoglycemia P:   cbg now  NEUROLOGIC Ct head -Arachnoid cyst anterior left temporal region impressing on the left  temporal lobe.    A:  Seizures in setting, subther dil, arachnoid cyst, ETOH P:   Dil load PER NEURO Neuro on board Neuro checks, seizure precaution keppra At risk WD thiamine to iv, folic acid, mvi Dc ciwa Consider ativan q12h, MD directed tox screen on order Fluids d5 to fluids, has already got thiamaine \\fent  pain  Summary statement: Load sub there dil, no ett yet needed, hope to avoid.   I have personally obtained a history, examined the patient, evaluated laboratory and imaging results, formulated the assessment and plan and placed orders. CRITICAL CARE: The patient is critically ill with multiple organ systems failure and requires high complexity decision making for assessment and support, frequent evaluation and titration of therapies, application of advanced monitoring technologies and extensive interpretation of multiple databases. Critical Care Time devoted to patient care services described in this note is 30  minutes.   Mcarthur Rossetti. Tyson Alias, MD, FACP Pgr: [phone] [facility]  Pulmonary and  Critical Care Medicine [facility] Pager: [phone]  03/08/2012, 3:01 PM

## 2012-03-08 NOTE — Progress Notes (Signed)
[facility] PROGRESS NOTE  [name] [name] UJW:[id number] DOB: 01/28/1981 DOA: 03/07/2012 PCP: Sheila Oats, MD  Assessment/Plan: 1. Status epileticus/seizures- treated in ER, keppra and dilantin, await neuro recommnedations 2. Alcohol abuse- CIWA protocol 3. Noncompliance- encourage patient to take medications as prescribed  Code Status: full Family Communication: patient at bedside Disposition Plan: home soon  ?transfer out SDU after eval by neuro  Consultants:  nuerology   HPI/Subjective: Patient asking for food and wanting to go home Recently in rehab for alcohol but drinking since release  Objective: Filed Vitals:   03/08/12 0200 03/08/12 0300 03/08/12 0440 03/08/12 0738  BP: 114/71  110/56 106/63  Pulse: 64 57 43 62  Temp:   98.1 F (36.7 C) 98.4 F (36.9 C)  TempSrc:   Oral Oral  Resp:  13 20 14   Height:      Weight:      SpO2:  98% 100% 99%    Intake/Output Summary (Last 24 hours) at 03/08/12 1007 Last data filed at 03/08/12 0700  Gross per 24 hour  Intake    610 ml  Output    300 ml  Net    310 ml   Filed Weights   03/07/12 1806 03/08/12 0030  Weight: 72.576 kg (160 lb) 71.3 kg (157 lb 3 oz)    Exam:   General:  A+Ox3, NAD  Cardiovascular: rrr  Respiratory: clear anterior  Abdomen: +BS, soft, NT.ND  Data Reviewed: Basic Metabolic Panel:  Lab 03/07/12 5621  NA 139  K 3.8  CL 105  CO2 23  GLUCOSE 139*  BUN 16  CREATININE 0.87  CALCIUM 9.1  MG --  PHOS --   Liver Function Tests:  Lab 03/07/12 2349  AST 20  ALT 11  ALKPHOS 90  BILITOT 0.2*  PROT 7.2  ALBUMIN 4.0   No results found for this basename: LIPASE:5,AMYLASE:5 in the last 168 hours No results found for this basename: AMMONIA:5 in the last 168 hours CBC:  Lab 03/07/12 1809  WBC 6.9  NEUTROABS 5.0  HGB 10.9*  HCT 34.6*  MCV 80.1  PLT 250   Cardiac Enzymes: No results found for this basename: CKTOTAL:5,CKMB:5,CKMBINDEX:5,TROPONINI:5 in the last 168  hours BNP (last 3 results) No results found for this basename: PROBNP:3 in the last 8760 hours CBG:  Lab 03/07/12 2009 03/07/12 1816  GLUCAP 92 144*    Recent Results (from the past 240 hour(s))  MRSA PCR SCREENING     Status: Normal   Collection Time   03/08/12  1:36 AM      Component Value Range Status Comment   MRSA by PCR NEGATIVE  NEGATIVE Final      Studies: Ct Head Wo Contrast  03/07/2012  *RADIOLOGY REPORT*  Clinical Data: Seizure  CT HEAD WITHOUT CONTRAST  Technique:  Contiguous axial images were obtained from the base of the skull through the vertex without contrast.  Comparison: None.  Findings:  Ventricles are normal in size and configuration. There is a 3.2 x 1.1 cm arachnoid cyst in the anterior left temporal region.  There is no other mass.  There is no  hemorrhage, extra- axial fluid collection, or midline shift. Gray-white compartments are normal.  Bony calvarium appears intact.  The mastoid air cells are clear.  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  Study otherwise unremarkable.   Original Report Authenticated By: Bretta Bang, M.D.     Scheduled Meds:   . folic acid  1 mg  Oral Daily  . [COMPLETED]  HYDROmorphone (DILAUDID) injection  0.5 mg Intravenous Once  . [COMPLETED]  HYDROmorphone (DILAUDID) injection  1 mg Intravenous Once  . [COMPLETED]  HYDROmorphone (DILAUDID) injection  1 mg Intravenous Once  . [COMPLETED] levetiracetam  1,000 mg Intravenous To ER  . levetiracetam  1,000 mg Intravenous Q12H  . [COMPLETED] LORazepam  2 mg Intravenous Once  . multivitamin with minerals  1 tablet Oral Daily  . [COMPLETED] ondansetron (ZOFRAN) IV  4 mg Intravenous Once  . [COMPLETED] phenytoin  200 mg Oral Once  . phenytoin  200 mg Oral BID  . thiamine  100 mg Oral Daily   Or  . thiamine  100 mg Intravenous Daily  . [DISCONTINUED] sodium chloride   Intravenous STAT  . [DISCONTINUED] acetaminophen  1,000 mg  Oral Once  . [DISCONTINUED] etomidate  20 mg Intravenous Once  . [DISCONTINUED] LORazepam  2 mg Intravenous Once  . [DISCONTINUED] rocuronium  0.6 mg/kg Intravenous Once   Continuous Infusions:   . sodium chloride 100 mL/hr at 03/08/12 0200    Principal Problem:  *Status epilepticus, generalized convulsive Active Problems:  Seizure disorder  EtOH dependence  Drug-seeking behavior    Time spent: 35 min    VANN, JESSICA  [facility] Pager [phone]. If 8PM-8AM, please contact night-coverage at [email or web address], password [facility] 03/08/2012, 10:07 AM  LOS: 1 day

## 2012-03-08 NOTE — Progress Notes (Signed)
Pt shared w/me that his 29 yr old daughter and daughter's mother were killed in a car accident in July. He also shared that his mother committed suicide (I believe less than 5 years ago). He complained of his head hurting and I asked several times if we could talk later. He insisted that he wanted to talk at the time. He said he was very upset and angry about the recent deaths. After listening and acknowledging how he felt, he thanked me and asked me to pray for him. I provided spiritual and emotional support to this pt and recommended grief counseling through hospice.  Marjory Lies Chaplain

## 2012-03-08 NOTE — Progress Notes (Signed)
Called by primary RN  Due to patient having multiple seizures.  Upon arrival to patient's room, Rn at bedside.  Patient is post-itical and had been given 1 mg IV ativan prior to my arrival.  Dr. Benjamine Mola was paged.  Patient had 3 more seizures in my presence generalized full body seizures lasting about 30 secs apiece and 2-3 minutes apart.  Dr. Amada Jupiter at bedside, patient received an additional 4 mg IV ativan.  Dr. Tyson Alias at bedside.  3100 bed requested, patient transported to 3101 via bed with monitor on and oxygen via nasal cannula.  MD's present during transport.  Report given to RN.  Rn to call if assistance needed

## 2012-03-09 ENCOUNTER — Inpatient Hospital Stay (HOSPITAL_COMMUNITY): Payer: Self-pay

## 2012-03-09 DIAGNOSIS — R569 Unspecified convulsions: Secondary | ICD-10-CM

## 2012-03-09 LAB — BASIC METABOLIC PANEL
CO2: 26 mEq/L (ref 19–32)
Chloride: 104 mEq/L (ref 96–112)
Creatinine, Ser: 0.86 mg/dL (ref 0.50–1.35)
GFR calc Af Amer: >90 mL/min (ref >90)
Sodium: 139 mEq/L (ref 135–145)

## 2012-03-09 LAB — CK: Total CK: 73 U/L (ref 7–232)

## 2012-03-09 LAB — POCT I-STAT 3, ART BLOOD GAS (G3+)
Bicarbonate: 26.9 mEq/L — ABNORMAL HIGH (ref 20.0–24.0)
TCO2: 28 mmol/L (ref 0–100)
pCO2 arterial: 32.2 mmHg — ABNORMAL LOW (ref 35.0–45.0)
pH, Arterial: 7.531 — ABNORMAL HIGH (ref 7.350–7.450)
pO2, Arterial: 179 mmHg — ABNORMAL HIGH (ref 80.0–100.0)

## 2012-03-09 LAB — PHENYTOIN LEVEL, TOTAL: Phenytoin Lvl: 18.8 ug/mL (ref 10.0–20.0)

## 2012-03-09 LAB — PROTEIN C ACTIVITY: Protein C Activity: 98 % (ref 75–133)

## 2012-03-09 MED ORDER — PREGABALIN 50 MG PO CAPS
100.0000 mg | ORAL_CAPSULE | Freq: Two times a day (BID) | ORAL | Status: DC
  Administered 2012-03-09: 100 mg via ORAL
  Filled 2012-03-09: qty 1

## 2012-03-09 MED ORDER — PREGABALIN 50 MG PO CAPS
100.0000 mg | ORAL_CAPSULE | Freq: Three times a day (TID) | ORAL | Status: DC
  Administered 2012-03-09 (×2): 100 mg via ORAL
  Filled 2012-03-09 (×2): qty 2

## 2012-03-09 MED ORDER — FENTANYL CITRATE 0.05 MG/ML IJ SOLN
25.0000 ug | Freq: Once | INTRAMUSCULAR | Status: AC
  Administered 2012-03-09: 25 ug via INTRAVENOUS

## 2012-03-09 MED ORDER — LORAZEPAM 2 MG/ML IJ SOLN
2.0000 mg | Freq: Once | INTRAMUSCULAR | Status: AC
  Administered 2012-03-09: 2 mg via INTRAVENOUS

## 2012-03-09 MED ORDER — FENTANYL CITRATE 0.05 MG/ML IJ SOLN
25.0000 ug | INTRAMUSCULAR | Status: DC | PRN
  Administered 2012-03-09 – 2012-03-10 (×13): 75 ug via INTRAVENOUS
  Administered 2012-03-10: 50 ug via INTRAVENOUS
  Administered 2012-03-10 (×5): 75 ug via INTRAVENOUS
  Administered 2012-03-10: 50 ug via INTRAVENOUS
  Administered 2012-03-10: 75 ug via INTRAVENOUS
  Administered 2012-03-10: 50 ug via INTRAVENOUS
  Administered 2012-03-10 (×2): 75 ug via INTRAVENOUS
  Administered 2012-03-10: 50 ug via INTRAVENOUS
  Administered 2012-03-10: 75 ug via INTRAVENOUS
  Administered 2012-03-10 – 2012-03-12 (×19): 50 ug via INTRAVENOUS
  Filled 2012-03-09 (×38): qty 2

## 2012-03-09 MED ORDER — IBUPROFEN 400 MG PO TABS
400.0000 mg | ORAL_TABLET | Freq: Four times a day (QID) | ORAL | Status: DC | PRN
  Administered 2012-03-11 (×2): 400 mg via ORAL
  Filled 2012-03-09 (×3): qty 1

## 2012-03-09 MED ORDER — KCL IN DEXTROSE-NACL 20-5-0.45 MEQ/L-%-% IV SOLN
INTRAVENOUS | Status: DC
  Filled 2012-03-09 (×8): qty 1000

## 2012-03-09 MED ORDER — MIDAZOLAM HCL 2 MG/2ML IJ SOLN
INTRAMUSCULAR | Status: AC
  Filled 2012-03-09: qty 4

## 2012-03-09 MED FILL — Hydromorphone HCl Inj 1 MG/ML: INTRAMUSCULAR | Qty: 1 | Status: AC

## 2012-03-09 MED FILL — Lorazepam Inj 2 MG/ML: INTRAMUSCULAR | Qty: 1 | Status: AC

## 2012-03-09 NOTE — Progress Notes (Signed)
Name: [name] [name] MRN: [id number] DOB: 06/04/1980    LOS: 2  REFERRING PRIVIDER:  Traid CHIEF COMPLAINT:  Seizures, airway protection   Brief patient description: 31 yr old ETOH, seizure d/o adm by [facility] 11/02 with multiple seizures. Transferred to ICU 11/03 with prolonged seizure and concern re: airway protection.  Significant studies procedures and events:  11/3 - seizure floor 11/04 AM:  Prolonged seizure. Abated with 4 mg IV lorazepam  Lines/tubes: None  Cultures: MRSA PCR 11/03 >> NEG  Antibiotics: None  Level of Care:  ICU Primary Service:  PCCM Consultants:  neuro Code Status:  Full Diet:  Reg DVT Px:  scd GI Px:  ppi    Interval/ subjective:  Walked in on patient this AM in midst of generalized tonic-clonic seizure. Required 4 mg IV lorazepam. Neurology evaluating and adjusting AEDs. Asking for opioids   Vital Signs: Temp:  [97.2 F (36.2 C)-97.8 F (36.6 C)] 97.6 F (36.4 C) (11/04 0700) Pulse Rate:  [56-129] 84  (11/04 1100) Resp:  [12-37] 15  (11/04 1100) BP: (96-132)/(48-79) 96/55 mmHg (11/04 1100) SpO2:  [91 %-100 %] 96 % (11/04 1100) Weight:  [75.9 kg (167 lb 5.3 oz)] 75.9 kg (167 lb 5.3 oz) (11/04 0500)   Intake/Output Summary (Last 24 hours) at 03/09/12 1134 Last data filed at 03/09/12 1100  Gross per 24 hour  Intake 3136.08 ml  Output   3045 ml  Net  91.08 ml    Physical Examination: General:  Lethargic, postictal Neuro:  MAEs, + F/C HEENT: WNL Cardiovascular:  RRR s M Lungs:  Clear Abdomen:  Soft, NABS, NT Ext: no edema Skin:  tatoo multiple   BMET    Component Value Date/Time   NA 139 03/09/2012 0930   K 4.0 03/09/2012 0930   CL 104 03/09/2012 0930   CO2 26 03/09/2012 0930   GLUCOSE 113* 03/09/2012 0930   BUN 11 03/09/2012 0930   CREATININE 0.86 03/09/2012 0930   CALCIUM 8.7 03/09/2012 0930   GFRNONAA >90 03/09/2012 0930   GFRAA >90 03/09/2012 0930    CBC    Component Value Date/Time   WBC 5.6 03/08/2012 1641   RBC 4.17* 03/08/2012 1641   HGB 10.6* 03/08/2012 1641   HCT 34.0* 03/08/2012 1641   PLT 226 03/08/2012 1641   MCV 81.5 03/08/2012 1641   MCH 25.4* 03/08/2012 1641   MCHC 31.2 03/08/2012 1641   RDW 15.4 03/08/2012 1641   LYMPHSABS 1.2 03/08/2012 1641   MONOABS 0.8 03/08/2012 1641   EOSABS 0.2 03/08/2012 1641   BASOSABS 0.0 03/08/2012 1641    CXR: NACPD   DIAGNOSES: Principal Problem:  *Status epilepticus, generalized convulsive Active Problems:  Seizure disorder  EtOH dependence  Drug-seeking behavior   ASSESSMENT / PLAN:  PULMONARY  A:  Resp status stable but at risk for intubation due to uncontrolled seizures P:   Monitor in ICU    RENAL  A:  No issues IVFs ordered Monitor chemistries intermittently   GASTROINTESTINAL  A:  No issues  P:    Reg diet  HEMATOLOGIC  Lab 03/08/12 1641 03/07/12 1809  HGB 10.6* 10.9*  HCT 34.0* 34.6*  PLT 226 250  INR 1.32 --  APTT 30 --    A:  H/o Prot C def, reportedly has a filter P:  DVT prophylaxis ordered    NEUROLOGIC Ct head -Arachnoid cyst anterior left temporal region impressing on the left temporal lobe.    A:  Refractory seizures  despite 2 AEDs P:   Neurology to add 3rd agent Discussed possible MRI with Dr Amada Jupiter    Watch in ICU until seizures controlled   35 mins CCM time Discussed with Dr Josie Dixon, MD ; [facility] service Mobile [phone].  After 5:30 PM or weekends, call [phone]

## 2012-03-09 NOTE — Progress Notes (Signed)
EEG completed at bedside as ordered °

## 2012-03-09 NOTE — Progress Notes (Signed)
Subjective: Patient had seizure again this morning. Broke with 4mg  Ativan.  Exam: Filed Vitals:   03/09/12 0900  BP: 122/74  Pulse:   Temp:   Resp:    Gen: Post-ictal  MS: Awakens following cessation of seizure activity.  CN: PERRL, EOMI,  Motor: Withdraws all ext to pain Sensory: as above DTR: 2+ and symmetric   Impression: 31 yo M with seizure disorder and multiple breakthrough seizures. He reported some breakthrough seizures even on his regular preadmission dose of 200mg  BID dilantin and 1000mg  BID of Keppra. He had two doses of increased dose keppra and had another breakthrough today.   1) Start lyrica as third AED 2) EEG today, if generalized epilepsy is found, would change dilantin to depakoet rather than adding third agent.  3) MRI brain to further cahracterize left temporal lesion.  4) Continue keppra 1500mg  BID 5) Continue dilantin 200mg  BID 6) dilantin level  Ritta Slot, MD [facility] [phone]  If 7pm- 7am, please page neurology on call at [phone].

## 2012-03-09 NOTE — Progress Notes (Signed)
Pt. Upset, alternating between crying and cursing/threatening staff. States pain is 10 out of 10 and is unhappy with medications. Pt. States he wants dilaudid 2 mg IV every 2 hours for pain.ccm notified and no new pain medication orders. Pt. States he wants to leave [facility]a and ccm notified.

## 2012-03-09 NOTE — Clinical Social Work Psychosocial (Addendum)
Clinical Social Work Department BRIEF PSYCHOSOCIAL ASSESSMENT 03/09/2012  Patient:  [name] [name],[name]     Account Number:  [id number]     Admit date:  03/07/2012  Clinical Social Worker:  Pearson Forster  Date/Time:  03/09/2012 11:30 AM  Referred by:  RN  Date Referred:  03/09/2012 Referred for  Substance Abuse  Transportation assistance   Other Referral:   Patient is from [place] and would like to return at discharge   Interview type:  Patient Other interview type:    PSYCHOSOCIAL DATA Living Status:  FRIEND(S) Admitted from facility:   Level of care:   Primary support name:  Refused emergency contact information to be given Primary support relationship to patient:   Degree of support available:   Limited    CURRENT CONCERNS Current Concerns  Substance Abuse  Other - See comment   Other Concerns:   Transportation back to [place]    SOCIAL WORK ASSESSMENT / PLAN Clinical Social Worker met with patient at bedside to offer support, discuss patient needs at discharge, and discuss current substance use.  Patient states that he has had a rough life and "I don't need this on my plate too." Patient expressed his current loss of his 29 year old daughter and "baby mama" who were killed by a drunk driver in July of this year.  Patient mother committed suicide in 2001.  Patient states that he has been drinking ever since.    Patient states that he has always been a drinker but since he has had so much loss in his life it has only gotten worse.  Patient now drinks up to a case of a beer day, starting first thing in the morning.  Patient states he plans to stop drinking after this hospitalization.  CSW expressed concerns regarding the risks associated with stopping abrutly from heavy drinking without medical attention patient seemed to understand.  Patient states that he has been to meetings before and will just return if he has to.  Patient refused all other resources for drugs  and alcohol in his hometown area when offered.  SBIRT complete.    Patient shared that earlier this year he took 63 xanax and put a gun to his head but never pulled the trigger. Patient was admitted to [facility] for overdose with suicidal intent per patient report.  Patient states he was transferred to [facility] and then directly home.  Patient with no mental health treatment in the past and not willing to committ at this time either.  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 Patient would like to return to work as soon as possible.    Clinical Social Worker will remain available for support as needed for resources and transportation needs at discharge.   Assessment/plan status:  Psychosocial Support/Ongoing Assessment of Needs Other assessment/ plan:   Information/referral to community resources:   Visual merchandiser offered patient resources in [place] where he lives for current substance use and grief counseling.  Patient states that he is not interested at this time.    PATIENT'S/FAMILY'S RESPONSE TO PLAN OF CARE: Patient alert and oriented x3, however kept falling asleep mid conversation due to medication.  Patient states that he is currently living in [place] near his sister and plans to return at discharge.  Patient expressed concerns regarding transportation home - patient will check with sister about possible arrangements to come pick him up at discharge.  Per RN, patient manipulative with concerns  of aggression to medical staff.  Patient seems to respond favorably depending on approach and interation from staff. Patient expressed his appreciation for CSW concern.

## 2012-03-09 NOTE — Procedures (Signed)
History: 31 yo M with seizure disorder  Background: There is a well defined posterior dominant rhythm of 10 Hz that attenuates with eye opening. There was some mild slow activity associated with drowsiness.   EEG Diagnosis: 1) Normal EEG  Clinical Interpretation: This normal EEG is recorded in the waking and drowsy state. There was no seizure or seizure predisposition recorded on this study.   Ritta Slot, MD [facility] [phone]  If 7pm- 7am, please page neurology on call at [phone].

## 2012-03-10 ENCOUNTER — Inpatient Hospital Stay (HOSPITAL_COMMUNITY): Payer: Self-pay

## 2012-03-10 DIAGNOSIS — R52 Pain, unspecified: Secondary | ICD-10-CM | POA: Diagnosis present

## 2012-03-10 MED ORDER — METHOCARBAMOL 100 MG/ML IJ SOLN
500.0000 mg | Freq: Once | INTRAVENOUS | Status: AC
  Administered 2012-03-10: 500 mg via INTRAVENOUS
  Filled 2012-03-10: qty 5

## 2012-03-10 MED ORDER — LORAZEPAM 2 MG/ML IJ SOLN
INTRAMUSCULAR | Status: AC
  Administered 2012-03-10: 4 mg
  Filled 2012-03-10: qty 2

## 2012-03-10 MED ORDER — HYDROMORPHONE HCL 2 MG PO TABS
2.0000 mg | ORAL_TABLET | ORAL | Status: DC | PRN
  Administered 2012-03-10 – 2012-03-18 (×19): 2 mg via ORAL
  Filled 2012-03-10 (×19): qty 1

## 2012-03-10 MED ORDER — MAGIC MOUTHWASH
5.0000 mL | ORAL | Status: DC | PRN
  Filled 2012-03-10: qty 5

## 2012-03-10 MED ORDER — NICOTINE POLACRILEX 2 MG MT GUM
2.0000 mg | CHEWING_GUM | OROMUCOSAL | Status: DC | PRN
  Administered 2012-03-10 – 2012-03-17 (×44): 2 mg via ORAL
  Filled 2012-03-10 (×32): qty 1

## 2012-03-10 MED ORDER — GADOBENATE DIMEGLUMINE 529 MG/ML IV SOLN
15.0000 mL | Freq: Once | INTRAVENOUS | Status: AC | PRN
  Administered 2012-03-10: 15 mL via INTRAVENOUS

## 2012-03-10 MED ORDER — HYDROMORPHONE HCL 2 MG PO TABS: 2.0000 mg | ORAL_TABLET | ORAL | Status: DC | PRN

## 2012-03-10 MED ORDER — HYDROMORPHONE HCL 1 MG/ML PO LIQD: 2.0000 mg | ORAL | Status: DC | PRN

## 2012-03-10 MED ORDER — HYDROMORPHONE HCL PF 1 MG/ML IJ SOLN
INTRAMUSCULAR | Status: AC
  Administered 2012-03-10: 2 mg
  Filled 2012-03-10: qty 2

## 2012-03-10 MED ORDER — SODIUM CHLORIDE 0.9 % IV SOLN
200.0000 mg | Freq: Once | INTRAVENOUS | Status: AC
  Administered 2012-03-10: 200 mg via INTRAVENOUS
  Filled 2012-03-10: qty 20

## 2012-03-10 MED ORDER — SODIUM CHLORIDE 0.9 % IV SOLN
100.0000 mg | Freq: Two times a day (BID) | INTRAVENOUS | Status: DC
  Administered 2012-03-10 (×2): 100 mg via INTRAVENOUS
  Filled 2012-03-10 (×5): qty 10

## 2012-03-10 MED ORDER — LORAZEPAM 2 MG/ML IJ SOLN
INTRAMUSCULAR | Status: AC
  Filled 2012-03-10: qty 1

## 2012-03-10 NOTE — Progress Notes (Addendum)
eLink Physician-Brief Progress Note Patient Name: [name] [name] DOB: 1980-06-08 MRN: [id number]  Date of Service  03/10/2012   HPI/Events of Note  GTC x 5 minutes. 2mg  IV ativan ordered over phone by Dr Roseanne Reno being given right now 3:31 AM   eICU Interventions  Monitor closely If refractory, intubate   Re-exam via camera 3:42 AM  - patient by now got 4mg  total ativan. At this point mental status is completely normal. He is writing in pain and asking for pain meds. ? Has pseudo seizure   Intervention Category Intermediate Interventions: Other:  Mikeila Burgen 03/10/2012, 3:31 AM

## 2012-03-10 NOTE — Progress Notes (Signed)
       Name: [name] [name] MRN: [id number] DOB: August 10, 1980    LOS: 3  REFERRING PRIVIDER:  Traid CHIEF COMPLAINT:  Seizures, airway protection   Brief patient description: 31 yr old ETOH, seizure d/o adm by [facility] 11/02 with multiple seizures. Transferred to ICU 11/03 with prolonged seizure and concern re: airway protection.  Significant studies procedures and events:  11/02 CT head: Arachnoid cyst anterior left temporal region impressing on the left temporal lobe. Study otherwise unremarkable 11/03 Seizure. Transferred to ICU 11/04 AM:  Prolonged seizure. Abated with 4 mg IV lorazepam 11/05 AM: recurrent seizure 11/05 MRI brain - No acute or focal intracranial abnormality. There is no visible arachnoid cyst on today's study; prior CT appearance was related to partial volume averaging of sylvian fissure   Lines/tubes: None  Cultures: MRSA PCR 11/03 >> NEG  Antibiotics: None  Level of Care:  ICU Primary Service:  PCCM Consultants:  neuro Code Status:  Full Diet:  Reg DVT Px:  scd GI Px:  ppi    Interval/ subjective:  Recurrent seizure this early AM. Presently c/o mental fog and diffuse pain. No distress   Vital Signs: Temp:  [97.7 F (36.5 C)-98.1 F (36.7 C)] 98.1 F (36.7 C) (11/05 0700) Pulse Rate:  [67-100] 79  (11/05 1000) Resp:  [12-20] 17  (11/05 0900) BP: (88-143)/(55-117) 143/96 mmHg (11/05 1000) SpO2:  [93 %-100 %] 97 % (11/05 1000)   Intake/Output Summary (Last 24 hours) at 03/10/12 1235 Last data filed at 03/10/12 1100  Gross per 24 hour  Intake   1700 ml  Output   2775 ml  Net  -1075 ml    Physical Examination: General:  RASS 0. NAD Neuro:  MAEs, + F/C HEENT: WNL Cardiovascular:  RRR s M Lungs:  Clear Abdomen:  Soft, NABS, NT Ext: no edema Skin:  tatoo multiple   BMET    Component Value Date/Time   NA 139 03/09/2012 0930   K 4.0 03/09/2012 0930   CL 104 03/09/2012 0930   CO2 26 03/09/2012 0930   GLUCOSE 113* 03/09/2012 0930   BUN  11 03/09/2012 0930   CREATININE 0.86 03/09/2012 0930   CALCIUM 8.7 03/09/2012 0930   GFRNONAA >90 03/09/2012 0930   GFRAA >90 03/09/2012 0930    CBC    Component Value Date/Time   WBC 5.6 03/08/2012 1641   RBC 4.17* 03/08/2012 1641   HGB 10.6* 03/08/2012 1641   HCT 34.0* 03/08/2012 1641   PLT 226 03/08/2012 1641   MCV 81.5 03/08/2012 1641   MCH 25.4* 03/08/2012 1641   MCHC 31.2 03/08/2012 1641   RDW 15.4 03/08/2012 1641   LYMPHSABS 1.2 03/08/2012 1641   MONOABS 0.8 03/08/2012 1641   EOSABS 0.2 03/08/2012 1641   BASOSABS 0.0 03/08/2012 1641    CXR: NACPD   DIAGNOSES: Principal Problem:  *Status epilepticus, generalized convulsive Active Problems:  Seizure disorder  EtOH dependence  Drug-seeking behavior Pain syndrome - related to repeated seizures  Better controlled with dilaudid  PLAN: AEDs per Neurology Cont current analgesia regimen Cont to watch in ICU until seizures adequately controlled Counseled re: needed lifestyle changes   Billy Fischer, MD ; [facility] service Mobile [phone].  After 5:30 PM or weekends, call [phone]

## 2012-03-10 NOTE — Progress Notes (Signed)
Subjective: Recurrent seizure this morning. Refused MRI brain yesterday.   Exam: Filed Vitals:   03/10/12 0700  BP: 134/91  Pulse: 75  Temp: 98.1 F (36.7 C)  Resp: 16   Gen: In bed, NAD MS: Awake, Alert, oriented to person, place time and situation [id number], EOMI Motor: 5/5 throughout Sensory:symmetric to light touch DTR:2+ and symmetric in patellae and biceps.  Cerebellar: Normal FNF  Impression: 31 yo M with repeated breakthrough seizures. He was loaded with vimpat this morning. I would prefer he not remain on a four drug regimen and therefore will discontinue lyrica today, continue keppra and vimpat. I suspect he may have a viral syndrome causing his symptoms and lowering seizure threshold, however the fact that his seizures consistently have not stopped without medications is very concerning and I suspect that he remains at high risk for recurrent status epilepticus.   I do NOT have any suspicion at the current time for non-epileptic spells. The episode I witnessed two days ago was unequivocally epileptic.   Recommendations: 1) MRI brain to look for seizure focus.  2) Continue keppra, dilantin and vimpat 3) D/C lyrica. 4) Magic mouthwash.     Ritta Slot, MD [facility] [phone]  If 7pm- 7am, please page neurology on call at [phone].

## 2012-03-10 NOTE — Progress Notes (Signed)
Patient experienced a 10 minute seizure at 0345, 4MG  of Ativan given per Dr.Steward. CCM also consulted at this time. Will continue to monitor patient.

## 2012-03-11 MED ORDER — LACOSAMIDE 50 MG PO TABS
100.0000 mg | ORAL_TABLET | Freq: Two times a day (BID) | ORAL | Status: DC
  Administered 2012-03-11 – 2012-03-17 (×14): 100 mg via ORAL
  Filled 2012-03-11 (×12): qty 2
  Filled 2012-03-11: qty 1
  Filled 2012-03-11: qty 2
  Filled 2012-03-11: qty 1

## 2012-03-11 MED ORDER — ACETAMINOPHEN 325 MG PO TABS
650.0000 mg | ORAL_TABLET | Freq: Once | ORAL | Status: AC
  Administered 2012-03-11: 650 mg via ORAL
  Filled 2012-03-11: qty 2

## 2012-03-11 MED ORDER — LEVETIRACETAM 750 MG PO TABS
1500.0000 mg | ORAL_TABLET | Freq: Two times a day (BID) | ORAL | Status: DC
  Administered 2012-03-11 – 2012-03-18 (×14): 1500 mg via ORAL
  Filled 2012-03-11 (×17): qty 2

## 2012-03-11 NOTE — Progress Notes (Signed)
eLink Physician-Brief Progress Note Patient Name: [name] [name] DOB: June 29, 1980 MRN: [id number]  Date of Service  03/11/2012   HPI/Events of Note   Patient feeling warm per RN  eICU Interventions  Tylenol x 1   Intervention Category Intermediate Interventions: Other:  Kazmir Oki 03/11/2012, 4:41 AM

## 2012-03-11 NOTE — Progress Notes (Signed)
       Name: [name] [name] MRN: [id number] DOB: 11-18-1980    LOS: 4  REFERRING PRIVIDER:  Traid CHIEF COMPLAINT:  Seizures, airway protection   Brief patient description: 31 yr old ETOH, seizure d/o adm by [facility] 11/02 with multiple seizures. Transferred to ICU 11/03 with prolonged seizure and concern re: airway protection.  Significant studies procedures and events:  11/02 CT head: Arachnoid cyst anterior left temporal region impressing on the left temporal lobe. Study otherwise unremarkable 11/03 Seizure. Transferred to ICU 11/04 AM:  Prolonged seizure. Abated with 4 mg IV lorazepam 11/05 AM: recurrent seizure 11/05 MRI brain - No acute or focal intracranial abnormality. There is no visible arachnoid cyst on today's study; prior CT appearance was related to partial volume averaging of sylvian fissure   Lines/tubes: None  Cultures: MRSA PCR 11/03 >> NEG  Antibiotics: None  Level of Care:  ICU Primary Service:  PCCM Consultants:  neuro Code Status:  Full Diet:  Reg DVT Px:  scd GI Px:  ppi    Interval/ subjective:  Seizure free > 24 hrs   Vital Signs: Temp:  [98.5 F (36.9 C)-100.3 F (37.9 C)] 98.5 F (36.9 C) (11/06 0700) Pulse Rate:  [85-96] 96  (11/05 2000) Resp:  [12-25] 12  (11/06 1400) BP: (111-140)/(63-90) 112/63 mmHg (11/06 1200) SpO2:  [95 %-100 %] 100 % (11/06 1200)   Intake/Output Summary (Last 24 hours) at 03/11/12 1436 Last data filed at 03/11/12 1400  Gross per 24 hour  Intake    640 ml  Output   2905 ml  Net  -2265 ml    Physical Examination: General:  RASS 0. NAD Neuro:  MAEs, + F/C HEENT: WNL Cardiovascular:  RRR s M Lungs:  Clear Abdomen:  Soft, NABS, NT Ext: no edema Skin:  tatoo multiple   BMET    Component Value Date/Time   NA 139 03/09/2012 0930   K 4.0 03/09/2012 0930   CL 104 03/09/2012 0930   CO2 26 03/09/2012 0930   GLUCOSE 113* 03/09/2012 0930   BUN 11 03/09/2012 0930   CREATININE 0.86 03/09/2012 0930   CALCIUM  8.7 03/09/2012 0930   GFRNONAA >90 03/09/2012 0930   GFRAA >90 03/09/2012 0930    CBC    Component Value Date/Time   WBC 5.6 03/08/2012 1641   RBC 4.17* 03/08/2012 1641   HGB 10.6* 03/08/2012 1641   HCT 34.0* 03/08/2012 1641   PLT 226 03/08/2012 1641   MCV 81.5 03/08/2012 1641   MCH 25.4* 03/08/2012 1641   MCHC 31.2 03/08/2012 1641   RDW 15.4 03/08/2012 1641   LYMPHSABS 1.2 03/08/2012 1641   MONOABS 0.8 03/08/2012 1641   EOSABS 0.2 03/08/2012 1641   BASOSABS 0.0 03/08/2012 1641    CXR: NACPD   DIAGNOSES: Principal Problem:  *Status epilepticus, generalized convulsive Active Problems:  Seizure disorder  EtOH dependence  Drug-seeking behavior  Pain Pain syndrome - related to repeated seizures  Better controlled with dilaudid  PLAN: AEDs per Neurology Cont current analgesia regimen Transfer to 4N Discussed with Dr Sharon Seller - [facility] to assume care in AM 11/07    Billy Fischer, MD ; [facility] service Mobile [phone].  After 5:30 PM or weekends, call [phone]

## 2012-03-11 NOTE — Clinical Social Work Note (Signed)
Clinical Social Worker continuing to follow patient for support and transportation needs at discharge.  Patient states that his sister will be coming to visit and will be able to provide transportation back to [place], [place] however patient will not provide sister's phone number to confirm plans.  CSW to follow up with patient prior to discharge to discuss transportation needs if necessary.  Macario Golds, [place] [phone]

## 2012-03-11 NOTE — Progress Notes (Signed)
Subjective: Patient resting but easily awakened.  Oriented.  No seizure activity since 11/5 at [facility].  Patient reports feeling sore and tired, headaches.    Objective: Current vital signs: BP 111/72  Pulse 96  Temp 100.3 F (37.9 C) (Oral)  Resp 14  Ht 5\' 8"  (1.727 m)  Wt 75.9 kg (167 lb 5.3 oz)  BMI 25.44 kg/m2  SpO2 99% Vital signs in last 24 hours: Temp:  [98.5 F (36.9 C)-100.3 F (37.9 C)] 100.3 F (37.9 C) (11/06 0400) Pulse Rate:  [75-96] 96  (11/05 2000) Resp:  [12-25] 14  (11/06 0600) BP: (111-146)/(68-96) 111/72 mmHg (11/06 0600) SpO2:  [95 %-100 %] 99 % (11/05 2200)  Intake/Output from previous day: 11/05 0701 - 11/06 0700 In: 652 [I.V.:392; IV Piggyback:260] Out: 1825 [Urine:1825] Intake/Output this shift:   Nutritional status: General  Neurologic Exam: Mental Status: Alert, oriented, thought content appropriate.  Speech fluent without evidence of aphasia.  Able to follow 3 step commands without difficulty. Cranial Nerves: II: Discs flat bilaterally; Visual fields grossly normal, pupils equal, round, reactive to light and accommodation III,IV, VI: ptosis not present, extra-ocular motions intact bilaterally V,VII: smile symmetric, facial light touch sensation normal bilaterally VIII: hearing normal bilaterally IX,X: gag reflex present XI: bilateral shoulder shrug XII: midline tongue extension Motor: Right : Upper extremity   5/5    Left:     Upper extremity   5/5  Lower extremity   5/5     Lower extremity   5/5 Tone and bulk:normal tone throughout; no atrophy noted Sensory: Pinprick and light touch intact throughout, bilaterally Deep Tendon Reflexes: 2+ and symmetric throughout Plantars: Right: downgoing   Left: downgoing Cerebellar: normal finger-to-nose and normal heel-to-shin test CV: pulses palpable throughout     Lab Results: Basic Metabolic Panel:  Lab 03/09/12 1610 03/08/12 1641 03/07/12 1809  NA 139 136 139  K 4.0 4.2 3.8  CL 104 105 105   CO2 26 25 23   GLUCOSE 113* 91 139*  BUN 11 14 16   CREATININE 0.86 0.82 0.87  CALCIUM 8.7 8.4 9.1  MG -- -- --  PHOS -- -- --    Liver Function Tests:  Lab 03/08/12 1641 03/07/12 2349  AST 14 20  ALT 9 11  ALKPHOS 86 90  BILITOT 0.1* 0.2*  PROT 6.2 7.2  ALBUMIN 3.3* 4.0   No results found for this basename: LIPASE:5,AMYLASE:5 in the last 168 hours No results found for this basename: AMMONIA:3 in the last 168 hours  CBC:  Lab 03/08/12 1641 03/07/12 1809  WBC 5.6 6.9  NEUTROABS 3.4 5.0  HGB 10.6* 10.9*  HCT 34.0* 34.6*  MCV 81.5 80.1  PLT 226 250    Cardiac Enzymes:  Lab 03/09/12 0930  CKTOTAL 73  CKMB --  CKMBINDEX --  TROPONINI --    Lipid Panel: No results found for this basename: CHOL:5,TRIG:5,HDL:5,CHOLHDL:5,VLDL:5,LDLCALC:5 in the last 168 hours  CBG:  Lab 03/08/12 2316 03/08/12 2015 03/07/12 2009 03/07/12 1816  GLUCAP 122* 131* 92 144*    Microbiology: Results for orders placed during the hospital encounter of 03/07/12  MRSA PCR SCREENING     Status: Normal   Collection Time   03/08/12  1:36 AM      Component Value Range Status Comment   MRSA by PCR NEGATIVE  NEGATIVE Final   MRSA PCR SCREENING     Status: Normal   Collection Time   03/08/12  3:24 PM      Component Value Range Status  Comment   MRSA by PCR NEGATIVE  NEGATIVE Final     Coagulation Studies:  Basename 03/08/12 1641  LABPROT 16.1*  INR 1.32    Imaging: Mr [name] QI Contrast  03/10/2012  *RADIOLOGY REPORT*  Clinical Data: Baseline seizure disorder with repeated breakthrough seizures.  Possible superimposed viral syndrome.  EtOH dependence with drug seeking behavior.  MRI HEAD WITHOUT AND WITH CONTRAST  Technique:  Multiplanar, multiecho pulse sequences of the brain and surrounding structures were obtained according to standard protocol without and with intravenous contrast  Contrast: 15mL MULTIHANCE GADOBENATE DIMEGLUMINE 529 MG/ML IV SOLN  Comparison: CT head 03/15/2012   Findings: The patient had difficulty remaining motionless for the study.  Images are suboptimal.  Small or subtle lesions could be overlooked.  There is no evidence for acute infarction, intracranial hemorrhage, mass lesion, hydrocephalus, or extra-axial fluid. There is no atrophy or white matter disease.  There are no foci of chronic hemorrhage.  Following administration of contrast, no abnormal enhancement of the brain or meninges.  Normal pituitary and cerebellar tonsils. Negative skull base and calvarium.  Clear sinuses and mastoids.  Thin section coronal images through the temporal lobe are degraded by motion.  There is no visible temporal lobe mass or inflammatory process.  There is apparent discrepancy between the CT images and MR.  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  I believe this CT appearance is a fortuitous  confluence of partial volume averaging of the left sylvian fissure simulating an extra-axial CSF collection.   Multiple series in all three orthogonal planes fail to demonstrate an arachnoid cyst on MR.  IMPRESSION: Motion degraded examination as described.  No acute or focal intracranial abnormality.  There is no visible arachnoid cyst on today's study; prior CT appearance was related to partial volume averaging of sylvian fissure.   Original Report Authenticated By: Davonna Belling, M.D.     Medications:  I have reviewed the patient's current medications. Scheduled:   . [COMPLETED] acetaminophen  650 mg Oral Once  . folic acid  1 mg Oral Daily  . heparin subcutaneous  5,000 Units Subcutaneous Q8H  . lacosamide (VIMPAT) IV  100 mg Intravenous Q12H  . levetiracetam  1,500 mg Intravenous Q12H  . phenytoin  200 mg Oral BID  . thiamine  100 mg Oral Daily  . [DISCONTINUED] pregabalin  100 mg Oral TID  . [DISCONTINUED] thiamine  100 mg Intravenous Daily    Assessment/Plan:  Patient Active Hospital Problem List: Status epilepticus, generalized convulsive (03/07/2012)    Assessment: Patient now seizure free for over 24 hours.  On IV Keppra and Vimpat.  Dilantin is po.  Last level 11/4 and 18.8.   Plan: 1.  Change Vimpat and Keppra to PO.  2.  If remains seizure free may be reasonable for discharge tomorrow.    LOS: 4 days   Thana Farr, MD [facility] [phone] 03/11/2012  7:33 AM

## 2012-03-12 DIAGNOSIS — M79609 Pain in unspecified limb: Secondary | ICD-10-CM

## 2012-03-12 DIAGNOSIS — R52 Pain, unspecified: Secondary | ICD-10-CM

## 2012-03-12 MED ORDER — HYDROMORPHONE HCL PF 1 MG/ML IJ SOLN
2.0000 mg | INTRAMUSCULAR | Status: DC | PRN
  Administered 2012-03-12 – 2012-03-17 (×31): 2 mg via INTRAMUSCULAR
  Filled 2012-03-12 (×6): qty 2
  Filled 2012-03-12: qty 1
  Filled 2012-03-12 (×18): qty 2
  Filled 2012-03-12: qty 1
  Filled 2012-03-12 (×7): qty 2

## 2012-03-12 MED ORDER — VANCOMYCIN HCL 1000 MG IV SOLR
1250.0000 mg | Freq: Two times a day (BID) | INTRAVENOUS | Status: DC
  Filled 2012-03-12 (×2): qty 1250

## 2012-03-12 MED ORDER — MINOCYCLINE HCL 100 MG PO CAPS
100.0000 mg | ORAL_CAPSULE | Freq: Two times a day (BID) | ORAL | Status: DC
  Administered 2012-03-12 – 2012-03-13 (×2): 100 mg via ORAL
  Filled 2012-03-12 (×4): qty 1

## 2012-03-12 MED ORDER — ENOXAPARIN SODIUM 120 MG/0.8ML ~~LOC~~ SOLN
1.5000 mg/kg | SUBCUTANEOUS | Status: DC
  Administered 2012-03-12: 110 mg via SUBCUTANEOUS
  Filled 2012-03-12 (×4): qty 0.8

## 2012-03-12 NOTE — Progress Notes (Addendum)
Subjective: Patient seen and examined, complained of left arm redness and pain this morning. Upper extremity duplex ordered which showed right brachial vein DVT. Patient has protein C deficiency.  Objective: Vital signs in last 24 hours: Temp:  [98.8 F (37.1 C)-100.9 F (38.3 C)] 98.8 F (37.1 C) (11/07 0600) Pulse Rate:  [86-97] 93  (11/07 0600) Resp:  [17-19] 18  (11/07 0600) BP: (105-136)/(54-76) 136/76 mmHg (11/07 0600) SpO2:  [98 %-100 %] 98 % (11/07 0600) Weight:  [71.9 kg (158 lb 8.2 oz)] 71.9 kg (158 lb 8.2 oz) (11/06 2000) Weight change:  Last BM Date: 03/10/12  Consults: Neurology Critical care  Procedures: EEG  Intake/Output from previous day: 11/06 0701 - 11/07 0700 In: 600 [P.O.:480; I.V.:120] Out: 1705 [Urine:1705]     Physical Exam: Head: Normocephalic, atraumatic.  Eyes: No signs of jaundice, EOMI Nose: Mucous membranes dry.  Neck: supple,No deformities, masses, or tenderness noted. Lungs: Normal respiratory effort. B/L Clear to auscultation, no crackles or wheezes.  Heart: Regular RR. S1 and S2 normal  Abdomen: BS normoactive. Soft, Nondistended, non-tender.  Extremities: erythema and induration in the medial side of right arm   No results found for this basename: LABPROT:2,INR:2 in the last 72 hours Urine Drug Screen: Drugs of Abuse     Component Value Date/Time   LABOPIA POSITIVE* 03/07/2012 2252   COCAINSCRNUR NONE DETECTED 03/07/2012 2252   LABBENZ POSITIVE* 03/07/2012 2252   AMPHETMU NONE DETECTED 03/07/2012 2252   THCU NONE DETECTED 03/07/2012 2252   LABBARB NONE DETECTED 03/07/2012 2252    Alcohol Level: No results found for this basename: ETH:2 in the last 72 hours Urinalysis: No results found for this basename: COLORURINE:2,APPERANCEUR:2,LABSPEC:2,PHURINE:2,GLUCOSEU:2,HGBUR:2,BILIRUBINUR:2,KETONESUR:2,PROTEINUR:2,UROBILINOGEN:2,NITRITE:2,LEUKOCYTESUR:2 in the last 72 hours Misc. Labs:  Recent Results (from the past 240 hour(s))  MRSA  PCR SCREENING     Status: Normal   Collection Time   03/08/12  1:36 AM      Component Value Range Status Comment   MRSA by PCR NEGATIVE  NEGATIVE Final   MRSA PCR SCREENING     Status: Normal   Collection Time   03/08/12  3:24 PM      Component Value Range Status Comment   MRSA by PCR NEGATIVE  NEGATIVE Final     Studies/Results: No results found.  Medications: Scheduled Meds:   . enoxaparin (LOVENOX) injection  1.5 mg/kg Subcutaneous Q24H  . folic acid  1 mg Oral Daily  . lacosamide  100 mg Oral BID  . levETIRAcetam  1,500 mg Oral BID  . phenytoin  200 mg Oral BID  . thiamine  100 mg Oral Daily  . vancomycin  1,250 mg Intravenous Q12H  . [DISCONTINUED] heparin subcutaneous  5,000 Units Subcutaneous Q8H   Continuous Infusions:   . dextrose 5 % and 0.45 % NaCl with KCl 20 mEq/L 10 mL/hr at 03/11/12 1900   PRN Meds:.HYDROmorphone (DILAUDID) injection, HYDROmorphone, ibuprofen, magic mouthwash, nicotine polacrilex, traMADol, [DISCONTINUED] fentaNYL    Principal Problem:  *Status epilepticus, generalized convulsive Active Problems:  Seizure disorder  EtOH dependence  Drug-seeking behavior  Pain    LOS: 5 days  Assessment/Plan:  RUE DVT Will start Lovenox.  Patient says he had bleeding with coumadin and lovenox in past, but he does not have insurance to cover for newer anticoagulants.  Seizure disorder Continue with Keppra, Vimpat, Dilantin.  Chronic pain Continue dilaudid prn  Cellulitis Will start vancomycin per pharmacy consult  Protein C deficiency Will need to be on Lovenox and coumadin.  [name] [name]  S [facility] Pager: [phone] 03/12/2012, 3:55 PM

## 2012-03-12 NOTE — Progress Notes (Signed)
Late entry . Patient refused RN to assess to restart IV. Patient reports he is a "hard stick" and will require a central line . Dr. Sharl Ma notified . IV RN venous access RN attempted to start IV and patient refused . Next shift RN aware. Continue with plan of care.        [name] [name]

## 2012-03-12 NOTE — Progress Notes (Signed)
ANTIBIOTIC CONSULT NOTE - INITIAL  Pharmacy Consult for vanc/lovenox Indication: Cellulitis/DVT  Allergies  Allergen Reactions  . Depakote (Divalproex Sodium)   . Fish Allergy   . Morphine And Related     Patient Measurements: Height: 5\' 8"  (172.7 cm) Weight: 158 lb 8.2 oz (71.9 kg) IBW/kg (Calculated) : 68.4   Vital Signs: Temp: 98.8 F (37.1 C) (11/07 0600) Temp src: Oral (11/07 0600) BP: 136/76 mmHg (11/07 0600) Pulse Rate: 93  (11/07 0600) Intake/Output from previous day: 11/06 0701 - 11/07 0700 In: 600 [P.O.:480; I.V.:120] Out: 1705 [Urine:1705] Intake/Output from this shift:    Labs: No results found for this basename: WBC:3,HGB:3,PLT:3,LABCREA:3,CREATININE:3 in the last 72 hours Estimated Creatinine Clearance: 122.6 ml/min (by C-G formula based on Cr of 0.86). No results found for this basename: VANCOTROUGH:2,VANCOPEAK:2,VANCORANDOM:2,GENTTROUGH:2,GENTPEAK:2,GENTRANDOM:2,TOBRATROUGH:2,TOBRAPEAK:2,TOBRARND:2,AMIKACINPEAK:2,AMIKACINTROU:2,AMIKACIN:2, in the last 72 hours   Microbiology: Recent Results (from the past 720 hour(s))  MRSA PCR SCREENING     Status: Normal   Collection Time   03/08/12  1:36 AM      Component Value Range Status Comment   MRSA by PCR NEGATIVE  NEGATIVE Final   MRSA PCR SCREENING     Status: Normal   Collection Time   03/08/12  3:24 PM      Component Value Range Status Comment   MRSA by PCR NEGATIVE  NEGATIVE Final     Medical History: Past Medical History  Diagnosis Date  . Seizure   . Protein C deficiency   . Protein C deficiency     Medications:  Scheduled:    . folic acid  1 mg Oral Daily  . lacosamide  100 mg Oral BID  . levETIRAcetam  1,500 mg Oral BID  . phenytoin  200 mg Oral BID  . thiamine  100 mg Oral Daily  . [DISCONTINUED] heparin subcutaneous  5,000 Units Subcutaneous Q8H   Assessment: 31 yo pt who was admitted for recurrent seizures. He is now suspected to have arm cellulitis as well as a brachial vein DVT.  Empiric vanc will be started for the cellulitis and lovenox for DVT.   Goal of Therapy:  Vancomycin trough level 10-15 mcg/ml Anti-Xa = 1-2  Plan:  Vanc 1.25g IV q12 Lovenox 110mg  SQ q24 F/u with trough if needed, CBC  Ulyses Southward [place] 03/12/2012,3:41 PM

## 2012-03-12 NOTE — Progress Notes (Signed)
VASCULAR LAB PRELIMINARY  PRELIMINARY  PRELIMINARY  PRELIMINARY  Right upper extremity venous duplex completed.    Preliminary report:  Right:  DVT noted in the brachial vein.  All other veins patent however walls appear thickened.  Interstitial fluid noted.  Mariachristina Holle, RVT 03/12/2012, 3:04 PM

## 2012-03-12 NOTE — Progress Notes (Signed)
LB PCCM  I was called to the bedside to consider a CVL for Mr. [name] [name] because he has cellulitis in the setting of an infiltrated peripheral IV with an underlying DVT.  He has seizure disorder and protein C def.   He is currently hemodynamically stable, on full dose anticoagulation, and refuses central line placement because "only radiology can get them in".    No indication for central access now.  Would treat cellulitis tonight with an oral antibiotic that covers MRSA: consider Linezolid, Doxycycline, Septra, Minocycline, or clindamycin.  Would discuss with interaction with pharmacy before starting.  Skin and Soft-Tissue Infections Caused by Methicillin-Resistant Staphylococcus aureus  [place]. Terri Skains, M.D., C.M. [place] Mis J Med 2007; 357:380-390July 26, 2007DOI: 10.1056/[id number]  If access needed in AM, then would consult IR for midline placement.  Discussed with [facility] cross cover.  Yolonda Kida PCCM Pager: [phone] Cell: [phone] If no response, call [phone]

## 2012-03-12 NOTE — Progress Notes (Signed)
Called to room patient complaining of severe pain in right arm . Edema noted with increased redness above IV site at antecubital space dated 11-2 -13 . Previous RN reported IV site out dated but patient difficult stick and IV fluids infusion at 10 cc /hour D5 1/2 ns . Right arm edema this am no redness noted  Positive pulse.brachial . MD notified.Alona Bene, Wynema Birch

## 2012-03-12 NOTE — Progress Notes (Signed)
Shift event:  PCCM was asked by daytime doc to put a central line in the pt given the fact that the pt has a DVT/cellulitis in the right arm, where his IV was. He has no other IV access and had refused for the IV team to try to stick him.  This NP spoke to Dr. Kendrick Fries who said the pt refused central line. Therefore, will start po Abx tonight for the cellulitis and attempt IV access (? Midline catheter) in the a.m., so that Vanc can be started. Pharm called to help with abx selection given pt's seizure meds. Jenetta Loges, said Minocycline was best choice. 100mg  po bid started until Vanc can be started IV. Maren Reamer, NP [facility]

## 2012-03-12 NOTE — Progress Notes (Addendum)
Subjective: Patient reports that the he has remained seizure free. Does have a headache that he feels is worse than usual but feels this is related to the number of seizures that he experienced.  Has been given pain medication this morning.    Objective: Current vital signs: BP 136/76  Pulse 93  Temp 98.8 F (37.1 C) (Oral)  Resp 18  Ht 5\' 8"  (1.727 m)  Wt 71.9 kg (158 lb 8.2 oz)  BMI 24.10 kg/m2  SpO2 98% Vital signs in last 24 hours: Temp:  [98.8 F (37.1 C)-100.9 F (38.3 C)] 98.8 F (37.1 C) (11/07 0600) Pulse Rate:  [86-97] 93  (11/07 0600) Resp:  [12-19] 18  (11/07 0600) BP: (105-136)/(54-76) 136/76 mmHg (11/07 0600) SpO2:  [97 %-100 %] 98 % (11/07 0600) Weight:  [71.9 kg (158 lb 8.2 oz)] 71.9 kg (158 lb 8.2 oz) (11/06 2000)  Intake/Output from previous day: 11/06 0701 - 11/07 0700 In: 600 [P.O.:480; I.V.:120] Out: 1705 [Urine:1705] Intake/Output this shift:   Nutritional status: General  Neurologic Exam: Mental Status:  Alert, oriented, thought content appropriate. Speech fluent without evidence of aphasia. Able to follow 3 step commands without difficulty.  Cranial Nerves:  II: Discs flat bilaterally; Visual fields grossly normal, pupils equal, round, reactive to light and accommodation  III,IV, VI: ptosis not present, extra-ocular motions intact bilaterally  V,VII: smile symmetric, facial light touch sensation normal bilaterally  VIII: hearing normal bilaterally  IX,X: gag reflex present  XI: bilateral shoulder shrug  XII: midline tongue extension  Motor:  Right : Upper extremity 5/5 Left: Upper extremity 5/5  Lower extremity 5/5 Lower extremity 5/5  Tone and bulk:normal tone throughout; no atrophy noted  Sensory: Pinprick and light touch intact throughout, bilaterally  Deep Tendon Reflexes: 2+ and symmetric throughout  Plantars:  Right: downgoing Left: downgoing  Cerebellar:  normal finger-to-nose and normal heel-to-shin test  CV: pulses palpable  throughout    Lab Results: Basic Metabolic Panel:  Lab 03/09/12 1610 03/08/12 1641 03/07/12 1809  NA 139 136 139  K 4.0 4.2 3.8  CL 104 105 105  CO2 26 25 23   GLUCOSE 113* 91 139*  BUN 11 14 16   CREATININE 0.86 0.82 0.87  CALCIUM 8.7 8.4 9.1  MG -- -- --  PHOS -- -- --    Liver Function Tests:  Lab 03/08/12 1641 03/07/12 2349  AST 14 20  ALT 9 11  ALKPHOS 86 90  BILITOT 0.1* 0.2*  PROT 6.2 7.2  ALBUMIN 3.3* 4.0   No results found for this basename: LIPASE:5,AMYLASE:5 in the last 168 hours No results found for this basename: AMMONIA:3 in the last 168 hours  CBC:  Lab 03/08/12 1641 03/07/12 1809  WBC 5.6 6.9  NEUTROABS 3.4 5.0  HGB 10.6* 10.9*  HCT 34.0* 34.6*  MCV 81.5 80.1  PLT 226 250    Cardiac Enzymes:  Lab 03/09/12 0930  CKTOTAL 73  CKMB --  CKMBINDEX --  TROPONINI --    Lipid Panel: No results found for this basename: CHOL:5,TRIG:5,HDL:5,CHOLHDL:5,VLDL:5,LDLCALC:5 in the last 168 hours  CBG:  Lab 03/08/12 2316 03/08/12 2015 03/07/12 2009 03/07/12 1816  GLUCAP 122* 131* 92 144*    Microbiology: Results for orders placed during the hospital encounter of 03/07/12  MRSA PCR SCREENING     Status: Normal   Collection Time   03/08/12  1:36 AM      Component Value Range Status Comment   MRSA by PCR NEGATIVE  NEGATIVE Final  MRSA PCR SCREENING     Status: Normal   Collection Time   03/08/12  3:24 PM      Component Value Range Status Comment   MRSA by PCR NEGATIVE  NEGATIVE Final     Coagulation Studies: No results found for this basename: LABPROT:5,INR:5 in the last 72 hours  Imaging: Mr [name] Wo Contrast  03/10/2012  *RADIOLOGY REPORT*  Clinical Data: Baseline seizure disorder with repeated breakthrough seizures.  Possible superimposed viral syndrome.  EtOH dependence with drug seeking behavior.  MRI HEAD WITHOUT AND WITH CONTRAST  Technique:  Multiplanar, multiecho pulse sequences of the brain and surrounding structures were obtained  according to standard protocol without and with intravenous contrast  Contrast: 15mL MULTIHANCE GADOBENATE DIMEGLUMINE 529 MG/ML IV SOLN  Comparison: CT head 03/15/2012  Findings: The patient had difficulty remaining motionless for the study.  Images are suboptimal.  Small or subtle lesions could be overlooked.  There is no evidence for acute infarction, intracranial hemorrhage, mass lesion, hydrocephalus, or extra-axial fluid. There is no atrophy or white matter disease.  There are no foci of chronic hemorrhage.  Following administration of contrast, no abnormal enhancement of the brain or meninges.  Normal pituitary and cerebellar tonsils. Negative skull base and calvarium.  Clear sinuses and mastoids.  Thin section coronal images through the temporal lobe are degraded by motion.  There is no visible temporal lobe mass or inflammatory process.  There is apparent discrepancy between the CT images and MR.  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  I believe this CT appearance is a fortuitous  confluence of partial volume averaging of the left sylvian fissure simulating an extra-axial CSF collection.   Multiple series in all three orthogonal planes fail to demonstrate an arachnoid cyst on MR.  IMPRESSION: Motion degraded examination as described.  No acute or focal intracranial abnormality.  There is no visible arachnoid cyst on today's study; prior CT appearance was related to partial volume averaging of sylvian fissure.   Original Report Authenticated By: Davonna Belling, M.D.     Medications:  I have reviewed the patient's current medications. Scheduled:   . folic acid  1 mg Oral Daily  . lacosamide  100 mg Oral BID  . levETIRAcetam  1,500 mg Oral BID  . phenytoin  200 mg Oral BID  . thiamine  100 mg Oral Daily  . [DISCONTINUED] heparin subcutaneous  5,000 Units Subcutaneous Q8H    Assessment/Plan:  Patient Active Hospital Problem List:  Seizure disorder (03/07/2012)   Assessment:  Patient has remained seizure free now on Keppra, Vimpat and Dilantin by mouth.  Tolerating well.     Plan: 1. Continue current medications at current doses.  To follow up with a neurologist as an outpatient.  No further recommendations at this time.    2. May use Ultram for headache prn as an outpatient     LOS: 5 days   Thana Farr, MD [facility] [phone] 03/12/2012  9:41 AM

## 2012-03-13 DIAGNOSIS — L0291 Cutaneous abscess, unspecified: Secondary | ICD-10-CM

## 2012-03-13 DIAGNOSIS — I82409 Acute embolism and thrombosis of unspecified deep veins of unspecified lower extremity: Secondary | ICD-10-CM

## 2012-03-13 DIAGNOSIS — L0390 Cellulitis, unspecified: Secondary | ICD-10-CM

## 2012-03-13 MED ORDER — VANCOMYCIN HCL 1000 MG IV SOLR
1250.0000 mg | Freq: Two times a day (BID) | INTRAVENOUS | Status: DC
  Filled 2012-03-13 (×2): qty 1250

## 2012-03-13 MED ORDER — HYDROMORPHONE HCL PF 1 MG/ML IJ SOLN
0.5000 mg | Freq: Once | INTRAMUSCULAR | Status: AC
  Administered 2012-03-13: 0.5 mg via INTRAMUSCULAR
  Filled 2012-03-13: qty 1

## 2012-03-13 NOTE — Consult Note (Signed)
Reason for Consult:right forearm swelling Referring Physician: [name] is an 31 y.o. male.  HPI: s/p IV acsess around 5 days ago now with brachial vein blockage and swelling and pain right forearm  Past Medical History  Diagnosis Date  . Seizure   . Protein C deficiency   . Protein C deficiency     Past Surgical History  Procedure Date  . Insertion of vena cava filter     History reviewed. No pertinent family history.  Social History:  reports that he has been smoking Cigarettes.  He has been smoking about .25 packs per day. He does not have any smokeless tobacco history on file. He reports that he drinks about 3.6 ounces of alcohol per week. He reports that he uses illicit drugs (Marijuana).  Allergies:  Allergies  Allergen Reactions  . Depakote (Divalproex Sodium)   . Fish Allergy   . Morphine And Related     Medications:  Prior to Admission:  Prescriptions prior to admission  Medication Sig Dispense Refill  . levETIRAcetam (KEPPRA) 1000 MG tablet Take 1,000 mg by mouth 2 (two) times daily.      . phenytoin (DILANTIN) 100 MG ER capsule Take 200 mg by mouth 2 (two) times daily.        No results found for this or any previous visit (from the past 48 hour(s)).  No results found.  Review of Systems  All other systems reviewed and are negative.   Blood pressure 131/77, pulse 85, temperature 99.1 F (37.3 C), temperature source Oral, resp. rate 18, height 5\' 8"  (1.727 m), weight 71.9 kg (158 lb 8.2 oz), SpO2 98.00%. Physical Exam  Constitutional: He is oriented to person, place, and time. He appears well-developed and well-nourished.  HENT:  Head: Normocephalic and atraumatic.  Cardiovascular: Normal rate.   Respiratory: Effort normal.  Musculoskeletal:       Right elbow: He exhibits swelling. tenderness found.       Arms: Neurological: He is alert and oriented to person, place, and time.  Psychiatric: He has a normal mood and affect. His behavior is  normal. Judgment and thought content normal.    Assessment/Plan: As above  No signs of acute infection or compartment syndrome at this point  Would consider vascular consult  [name] [name] A 03/13/2012, 12:54 PM

## 2012-03-13 NOTE — Plan of Care (Signed)
Problem: Phase II Progression Outcomes Goal: Progress activity as tolerated unless otherwise ordered Outcome: Not Applicable Date Met:  03/13/12 Pt refusing to ambulate secondary to severe pain in right arm.

## 2012-03-13 NOTE — Progress Notes (Signed)
ANTIBIOTIC CONSULT NOTE - INITIAL  Pharmacy Consult for vanc Indication: Cellulitis  Allergies  Allergen Reactions  . Depakote (Divalproex Sodium)   . Fish Allergy   . Morphine And Related     Patient Measurements: Height: 5\' 8"  (172.7 cm) Weight: 158 lb 8.2 oz (71.9 kg) IBW/kg (Calculated) : 68.4   Vital Signs: Temp: 100.8 F (38.2 C) (11/08 1355) Temp src: Oral (11/08 1355) BP: 133/82 mmHg (11/08 1355) Pulse Rate: 91  (11/08 1355) Intake/Output from previous day: 11/07 0701 - 11/08 0700 In: -  Out: 750 [Urine:750] Intake/Output from this shift: Total I/O In: 360 [P.O.:360] Out: 950 [Urine:950]  Labs: No results found for this basename: WBC:3,HGB:3,PLT:3,LABCREA:3,CREATININE:3 in the last 72 hours Estimated Creatinine Clearance: 122.6 ml/min (by C-G formula based on Cr of 0.86). No results found for this basename: VANCOTROUGH:2,VANCOPEAK:2,VANCORANDOM:2,GENTTROUGH:2,GENTPEAK:2,GENTRANDOM:2,TOBRATROUGH:2,TOBRAPEAK:2,TOBRARND:2,AMIKACINPEAK:2,AMIKACINTROU:2,AMIKACIN:2, in the last 72 hours   Microbiology: Recent Results (from the past 720 hour(s))  MRSA PCR SCREENING     Status: Normal   Collection Time   03/08/12  1:36 AM      Component Value Range Status Comment   MRSA by PCR NEGATIVE  NEGATIVE Final   MRSA PCR SCREENING     Status: Normal   Collection Time   03/08/12  3:24 PM      Component Value Range Status Comment   MRSA by PCR NEGATIVE  NEGATIVE Final     Medical History: Past Medical History  Diagnosis Date  . Seizure   . Protein C deficiency   . Protein C deficiency     Medications:  Scheduled:     . enoxaparin (LOVENOX) injection  1.5 mg/kg Subcutaneous Q24H  . folic acid  1 mg Oral Daily  . lacosamide  100 mg Oral BID  . levETIRAcetam  1,500 mg Oral BID  . minocycline  100 mg Oral BID  . phenytoin  200 mg Oral BID  . thiamine  100 mg Oral Daily  . [DISCONTINUED] vancomycin  1,250 mg Intravenous Q12H   Assessment: 31 yo pt who was  admitted for recurrent seizures. He is now suspected to have arm cellulitis. Empiric vanc will be started for the cellulitis.  Goal of Therapy:  Vancomycin trough level 10-15 mcg/ml  Plan:  Vanc 1.25g IV q12

## 2012-03-13 NOTE — Progress Notes (Signed)
Subjective: Patient  complained of left arm redness and pain this morning. Upper extremity duplex ordered which showed right brachial vein DVT. Patient has protein C deficiency.  Today patient was seen and examined, IV access could not be placed last night as patient has been constantly refusing to be stuck for peripheral IVs. He was started on by mouth antibiotics for right arm cellulitis.  Objective: Vital signs in last 24 hours: Temp:  [98.9 F (37.2 C)-100.8 F (38.2 C)] 100.8 F (38.2 C) (11/08 1355) Pulse Rate:  [85-104] 91  (11/08 1355) Resp:  [17-18] 18  (11/08 1355) BP: (131-137)/(77-82) 133/82 mmHg (11/08 1355) SpO2:  [97 %-98 %] 97 % (11/08 1355) Weight change:  Last BM Date: 03/10/12  Consults: Neurology Critical care  Procedures: EEG  Intake/Output from previous day: 11/07 0701 - 11/08 0700 In: -  Out: 750 [Urine:750] Total I/O In: 360 [P.O.:360] Out: 950 [Urine:950]   Physical Exam: Head: Normocephalic, atraumatic.  Eyes: No signs of jaundice, EOMI Nose: Mucous membranes dry.  Neck: supple,No deformities, masses, or tenderness noted. Lungs: Normal respiratory effort. B/L Clear to auscultation, no crackles or wheezes.  Heart: Regular RR. S1 and S2 normal  Abdomen: BS normoactive. Soft, Nondistended, non-tender.  Extremities: erythema and induration in the medial side of right arm   No results found for this basename: LABPROT:2,INR:2 in the last 72 hours Urine Drug Screen: Drugs of Abuse     Component Value Date/Time   LABOPIA POSITIVE* 03/07/2012 2252   COCAINSCRNUR NONE DETECTED 03/07/2012 2252   LABBENZ POSITIVE* 03/07/2012 2252   AMPHETMU NONE DETECTED 03/07/2012 2252   THCU NONE DETECTED 03/07/2012 2252   LABBARB NONE DETECTED 03/07/2012 2252    Alcohol Level: No results found for this basename: ETH:2 in the last 72 hours Urinalysis: No results found for this basename:  COLORURINE:2,APPERANCEUR:2,LABSPEC:2,PHURINE:2,GLUCOSEU:2,HGBUR:2,BILIRUBINUR:2,KETONESUR:2,PROTEINUR:2,UROBILINOGEN:2,NITRITE:2,LEUKOCYTESUR:2 in the last 72 hours Misc. Labs:  Recent Results (from the past 240 hour(s))  MRSA PCR SCREENING     Status: Normal   Collection Time   03/08/12  1:36 AM      Component Value Range Status Comment   MRSA by PCR NEGATIVE  NEGATIVE Final   MRSA PCR SCREENING     Status: Normal   Collection Time   03/08/12  3:24 PM      Component Value Range Status Comment   MRSA by PCR NEGATIVE  NEGATIVE Final     Studies/Results: No results found.  Medications: Scheduled Meds:    . enoxaparin (LOVENOX) injection  1.5 mg/kg Subcutaneous Q24H  . folic acid  1 mg Oral Daily  . lacosamide  100 mg Oral BID  . levETIRAcetam  1,500 mg Oral BID  . phenytoin  200 mg Oral BID  . thiamine  100 mg Oral Daily  . vancomycin  1,250 mg Intravenous Q12H  . [DISCONTINUED] minocycline  100 mg Oral BID  . [DISCONTINUED] vancomycin  1,250 mg Intravenous Q12H   Continuous Infusions:    . dextrose 5 % and 0.45 % NaCl with KCl 20 mEq/L 10 mL/hr at 03/11/12 1900   PRN Meds:.HYDROmorphone (DILAUDID) injection, HYDROmorphone, ibuprofen, magic mouthwash, nicotine polacrilex, traMADol    Principal Problem:  *Status epilepticus, generalized convulsive Active Problems:  Seizure disorder  EtOH dependence  Drug-seeking behavior  Pain    LOS: 6 days  Assessment/Plan:  RUE DVT Will start Lovenox.  Patient says he had bleeding with coumadin and lovenox in past, but he does not have insurance to cover for newer anticoagulants. We'll have to  start him on Coumadin along with Lovenox. 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  Seizure disorder Continue with Keppra, Vimpat, Dilantin.  Chronic pain Continue dilaudid prn  Cellulitis Orthopedic surgery was consulted, and they did not see any signs of compartment syndrome or infection at this time.  But patient continues to have fever and unfortunately he's refusing to get lab draws so we are unable to check his WBC count. He will be started on IV vancomycin tonight  IV access I've called and spoken to critical care team, and they will help including IV central line tonight so we can start the patient on antibiotics  Protein C deficiency Will need to be on Lovenox and coumadin.  [facility] Pager: [phone] 03/13/2012, 5:21 PM

## 2012-03-13 NOTE — Progress Notes (Signed)
   CARE MANAGEMENT NOTE 03/13/2012  Patient:  Vensel,Kodee   Account Number:  [id number]  Date Initiated:  03/13/2012  Documentation initiated by:  [facility]  Subjective/Objective Assessment:   seizures     Action/Plan:   lives at home alone   Anticipated DC Date:  03/14/2012   Anticipated DC Plan:  HOME/SELF CARE      DC Planning Services  CM consult  Medication Assistance      Choice offered to / List presented to:             Status of service:  Completed, signed off Medicare Important Message given?   (If response is "NO", the following Medicare IM given date fields will be blank) Date Medicare IM given:   Date Additional Medicare IM given:    Discharge Disposition:  HOME/SELF CARE  Per UR Regulation:    If discussed at Long Length of Stay Meetings, dates discussed:    Comments:  03/13/2012 1045 NCM spoke to pt and states he gets his medications from [facility]. His copay is $25.00. Did not report any difficulty with getting medications. Provided pt with Manufacturing engineer that had information on DSS, [employer] and [employer], and other resources. Provided pt with community Rx discount card that may assist with meds he pay for out of pocket. Contacted main pharmacy, pt is eligible for [facility] med assistance. NCM will determine if need for [employer] meds fund at d/c. Isidoro Donning RN CCM Case Mgmt phone [phone]

## 2012-03-13 NOTE — Progress Notes (Signed)
Noted that patient was ordered to receive IV vanc this AM, but patient without venous access and refused new IV. Maren Reamer, NP notified, Vancomycin d/c for time being.

## 2012-03-14 ENCOUNTER — Inpatient Hospital Stay (HOSPITAL_COMMUNITY): Payer: MEDICAID

## 2012-03-14 MED ORDER — WARFARIN VIDEO: Freq: Once | Status: DC

## 2012-03-14 MED ORDER — WARFARIN SODIUM 7.5 MG PO TABS
7.5000 mg | ORAL_TABLET | Freq: Once | ORAL | Status: DC
  Filled 2012-03-14: qty 1

## 2012-03-14 MED ORDER — SULFAMETHOXAZOLE-TMP DS 800-160 MG PO TABS
1.0000 | ORAL_TABLET | Freq: Two times a day (BID) | ORAL | Status: DC
  Administered 2012-03-14 – 2012-03-17 (×8): 1 via ORAL
  Filled 2012-03-14 (×10): qty 1

## 2012-03-14 MED ORDER — PATIENT'S GUIDE TO USING COUMADIN BOOK
Freq: Once | Status: DC
  Filled 2012-03-14: qty 1

## 2012-03-14 MED ORDER — WARFARIN - PHARMACIST DOSING INPATIENT: Freq: Every day | Status: DC

## 2012-03-14 MED ORDER — RIVAROXABAN 15 MG PO TABS
15.0000 mg | ORAL_TABLET | Freq: Two times a day (BID) | ORAL | Status: DC
  Administered 2012-03-14 – 2012-03-17 (×7): 15 mg via ORAL
  Filled 2012-03-14 (×12): qty 1

## 2012-03-14 NOTE — Progress Notes (Signed)
Patient refused Central Line placement at bedside. Patient wants Central line to be placed by IR. MD on-call (NP Claiborne Billings) aware. Will continue to monitor pt.

## 2012-03-14 NOTE — Procedures (Signed)
Attempted venous access under ultrasound guidance.  Both jugular veins chronically occluded.  Could not access a collateral vein in right neck.  Attempt made to access left brachial vein above elbow.  After 2 tries, patient refused any further attempts today.  Unable to obtain any venous access today.  Given picture, and right arm thrombophlebitis, may have to use femoral vein in future for access.

## 2012-03-14 NOTE — Progress Notes (Addendum)
After patient refused lab draw for PT-INR, RN called laboratory, IV team, and respiratory therapy for ideas/options to establish access for lab draws. IV therapy recommended either foot sticks, or for respiratory to draw arterial sticks for access. Respiratory was called and said they would be willing to try arterial sticks for lab draws. I went in and spoke to patient about possibilities. He said that foot sticks, nor arterial sticks, nor regular peripheral lab draws work on him. That he "just ends up a pincushion" and that "nothing ever works anyway. He said the only thing that works for him for constant lab draws is a quick capillary finger stick. Lab was then called and asked if our lab at the hospital can do PT-INR blood levels through a capillary finger stick, and lab said "no." Patient states he is loosing patience and does not know what is to be done about blood levels to check his PT-INR.   Will cont cont to monitor  Minor, Yvette Rack

## 2012-03-14 NOTE — Progress Notes (Signed)
ANTICOAGULATION CONSULT NOTE - Follow Up Consult  Pharmacy Consult for lovenox>>warfarin Indication: DVT  Allergies  Allergen Reactions  . Depakote (Divalproex Sodium)   . Fish Allergy   . Morphine And Related     Patient Measurements: Height: 5\' 8"  (172.7 cm) Weight: 158 lb 8.2 oz (71.9 kg) IBW/kg (Calculated) : 68.4   Vital Signs: Temp: 98.5 F (36.9 C) (11/09 0640) Temp src: Oral (11/09 0640) BP: 118/75 mmHg (11/09 0640) Pulse Rate: 85  (11/09 0640)  Labs: No results found for this basename: HGB:2,HCT:3,PLT:3,APTT:3,LABPROT:3,INR:3,HEPARINUNFRC:3,CREATININE:3,CKTOTAL:3,CKMB:3,TROPONINI:3 in the last 72 hours  Estimated Creatinine Clearance: 122.6 ml/min (by C-G formula based on Cr of 0.86).   Medications:  Lovenox 110mg  q 24 hours  Assessment: 31 year old male with right brachial DVT with protein C deficiency. He was started on sq lovenox 11/7 will start coumadin tonight. Baseline INR from 11/3 was 1.3 will recheck today.  Goal of Therapy:  INR 2-3 Anti-Xa level 0.6-1.2 units/ml 4hrs after LMWH dose given Monitor platelets by anticoagulation protocol: Yes   Plan:  Continue sq lovenox 110mg  q24h Start 7.5mg  of coumadin INR daily  Severiano Gilbert 03/14/2012,2:35 PM

## 2012-03-14 NOTE — Progress Notes (Addendum)
Subjective: Patient is a 31 year old male who was initially admitted on 03/07/2012 with seizure due to noncompliance with his medication,  complained of right arm arm redness and pain this morning. Upper extremity duplex ordered which showed right brachial vein DVT. Patient has protein C deficiency. Patient also was seen by orthopedics for possible abscess in the right arm. But also did not think that patient had abscess or compartment syndrome. Patient developed low-grade fever and IV antibiotics were ordered for possible cellulitis, but patient has a poor IV access and was not cooperative to be stuck again again for the IV access. CCM was consulted to place a central line and patient refused twice, so today IR was consulted  place a central line, and they also were unable to put a central line. Therefore the patient to put IV access in the left arm but patient again refused. Patient has been very noncompliant and also refusing labwork to check a CBC and also PT/INR.  Patient will be started on by mouth Bactrim, he is currently on therapeutic Lovenox for the right brachial vein DVT.   Objective: Vital signs in last 24 hours: Temp:  [98.5 F (36.9 C)-99.7 F (37.6 C)] 98.5 F (36.9 C) (11/09 1506) Pulse Rate:  [75-91] 75  (11/09 1506) Resp:  [15-18] 15  (11/09 1506) BP: (118-143)/(71-96) 143/96 mmHg (11/09 1506) SpO2:  [96 %-99 %] 99 % (11/09 1506) Weight change:  Last BM Date: 03/10/12  Consults: Neurology Critical care  Procedures: EEG  Intake/Output from previous day: 11/08 0701 - 11/09 0700 In: 360 [P.O.:360] Out: 950 [Urine:950]     Physical Exam: Head: Normocephalic, atraumatic.  Eyes: No signs of jaundice, EOMI Nose: Mucous membranes dry.  Neck: supple,No deformities, masses, or tenderness noted. Lungs: Normal respiratory effort. B/L Clear to auscultation, no crackles or wheezes.  Heart: Regular RR. S1 and S2 normal  Abdomen: BS normoactive. Soft, Nondistended,  non-tender.  Extremities: erythema and induration in the medial side of right arm   No results found for this basename: LABPROT:2,INR:2 in the last 72 hours Urine Drug Screen: Drugs of Abuse     Component Value Date/Time   LABOPIA POSITIVE* 03/07/2012 2252   COCAINSCRNUR NONE DETECTED 03/07/2012 2252   LABBENZ POSITIVE* 03/07/2012 2252   AMPHETMU NONE DETECTED 03/07/2012 2252   THCU NONE DETECTED 03/07/2012 2252   LABBARB NONE DETECTED 03/07/2012 2252    Alcohol Level: No results found for this basename: ETH:2 in the last 72 hours Urinalysis: No results found for this basename: COLORURINE:2,APPERANCEUR:2,LABSPEC:2,PHURINE:2,GLUCOSEU:2,HGBUR:2,BILIRUBINUR:2,KETONESUR:2,PROTEINUR:2,UROBILINOGEN:2,NITRITE:2,LEUKOCYTESUR:2 in the last 72 hours Misc. Labs:  Recent Results (from the past 240 hour(s))  MRSA PCR SCREENING     Status: Normal   Collection Time   03/08/12  1:36 AM      Component Value Range Status Comment   MRSA by PCR NEGATIVE  NEGATIVE Final   MRSA PCR SCREENING     Status: Normal   Collection Time   03/08/12  3:24 PM      Component Value Range Status Comment   MRSA by PCR NEGATIVE  NEGATIVE Final     Studies/Results: No results found.  Medications: Scheduled Meds:    . enoxaparin (LOVENOX) injection  1.5 mg/kg Subcutaneous Q24H  . folic acid  1 mg Oral Daily  . [COMPLETED]  HYDROmorphone (DILAUDID) injection  0.5 mg Intramuscular Once  . lacosamide  100 mg Oral BID  . levETIRAcetam  1,500 mg Oral BID  . patient's guide to using coumadin book   Does not apply  Once  . phenytoin  200 mg Oral BID  . sulfamethoxazole-trimethoprim  1 tablet Oral Q12H  . thiamine  100 mg Oral Daily  . warfarin  7.5 mg Oral ONCE-1800  . warfarin   Does not apply Once  . Warfarin - Pharmacist Dosing Inpatient   Does not apply q1800  . [DISCONTINUED] minocycline  100 mg Oral BID  . [DISCONTINUED] vancomycin  1,250 mg Intravenous Q12H   Continuous Infusions:    . dextrose 5 % and  0.45 % NaCl with KCl 20 mEq/L 10 mL/hr at 03/11/12 1900   PRN Meds:.HYDROmorphone (DILAUDID) injection, HYDROmorphone, ibuprofen, magic mouthwash, nicotine polacrilex, traMADol    Principal Problem:  *Status epilepticus, generalized convulsive Active Problems:  Seizure disorder  EtOH dependence  Drug-seeking behavior  Pain  Cellulitis  DVT (deep venous thrombosis)    LOS: 7 days  Assessment/Plan:  RUE DVT Will start Lovenox.  Patient says he had bleeding with coumadin and lovenox in past, but he does not have insurance to cover for newer anticoagulants. We'll have to start him on Coumadin along with Lovenox. 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  Seizure disorder Continue with Keppra, Vimpat, Dilantin.  Chronic pain Continue dilaudid prn  Cellulitis Orthopedic surgery was consulted, and they did not see any signs of compartment syndrome or infection at this time. But patient continues to have fever and unfortunately he's refusing to get lab draws so we are unable to check his WBC count. Patient will be started on by mouth Bactrim.  IV access CCM and I are unable to place IV access, and patient being noncompliant we are unable to place IV access at this time. In case of emergency we can use femoral line for the IV access.  Protein C deficiency Will need to be on Lovenox and coumadin.  [facility] Pager: [phone] 03/14/2012, 3:21 PM  Will start the patient on xarelto, will be able to get one year supply as outpatient. Will d/c Lovenox and coumadin.

## 2012-03-14 NOTE — Progress Notes (Signed)
Told by night RN that patient refused lovenox last night  Minor, [name] [name]

## 2012-03-14 NOTE — Progress Notes (Signed)
Per phlebotomy technician, patient refused PT-INR lab draw   Minor, [name] [name]

## 2012-03-15 NOTE — Progress Notes (Signed)
Subjective: Patient is a 31 year old male who was initially admitted on 03/07/2012 with seizure due to noncompliance with his medication,  complained of right arm arm redness and pain this morning. Upper extremity duplex ordered which showed right brachial vein DVT. Patient has protein C deficiency. Patient also was seen by orthopedics for possible abscess in the right arm. But also did not think that patient had abscess or compartment syndrome. Patient developed low-grade fever and IV antibiotics were ordered for possible cellulitis, but patient has a poor IV access and was not cooperative to be stuck again again for the IV access. CCM was consulted to place a central line and patient refused twice, so today IR was consulted  place a central line, and they also were unable to put a central line. Therefore the patient to put IV access in the left arm but patient again refused. Patient has been very noncompliant and also refusing labwork to check a CBC and also PT/INR.  Patient will be started on by mouth Bactrim, patient has been started on xarelto for the DVT and we have discontinued the Lovenox and Coumadin. Patient has been afebrile. Pain is fairly well-controlled   Objective: Vital signs in last 24 hours: Temp:  [98.5 F (36.9 C)-98.8 F (37.1 C)] 98.5 F (36.9 C) (11/10 0545) Pulse Rate:  [75-86] 76  (11/10 0545) Resp:  [15-18] 18  (11/10 0545) BP: (137-145)/(80-96) 137/80 mmHg (11/10 0545) SpO2:  [94 %-99 %] 94 % (11/10 0545) Weight change:  Last BM Date: 03/13/12  Consults: Neurology Critical care  Procedures: EEG  Intake/Output from previous day: 11/09 0701 - 11/10 0700 In: -  Out: 550 [Urine:550]     Physical Exam: Head: Normocephalic, atraumatic.  Eyes: No signs of jaundice, EOMI Nose: Mucous membranes dry.  Neck: supple,No deformities, masses, or tenderness noted. Lungs: Normal respiratory effort. B/L Clear to auscultation, no crackles or wheezes.  Heart: Regular  RR. S1 and S2 normal  Abdomen: BS normoactive. Soft, Nondistended, non-tender.  Extremities: reduced erythema and induration in the medial side of right arm   No results found for this basename: LABPROT:2,INR:2 in the last 72 hours Urine Drug Screen: Drugs of Abuse     Component Value Date/Time   LABOPIA POSITIVE* 03/07/2012 2252   COCAINSCRNUR NONE DETECTED 03/07/2012 2252   LABBENZ POSITIVE* 03/07/2012 2252   AMPHETMU NONE DETECTED 03/07/2012 2252   THCU NONE DETECTED 03/07/2012 2252   LABBARB NONE DETECTED 03/07/2012 2252    Alcohol Level: No results found for this basename: ETH:2 in the last 72 hours Urinalysis: No results found for this basename: COLORURINE:2,APPERANCEUR:2,LABSPEC:2,PHURINE:2,GLUCOSEU:2,HGBUR:2,BILIRUBINUR:2,KETONESUR:2,PROTEINUR:2,UROBILINOGEN:2,NITRITE:2,LEUKOCYTESUR:2 in the last 72 hours Misc. Labs:  Recent Results (from the past 240 hour(s))  MRSA PCR SCREENING     Status: Normal   Collection Time   03/08/12  1:36 AM      Component Value Range Status Comment   MRSA by PCR NEGATIVE  NEGATIVE Final   MRSA PCR SCREENING     Status: Normal   Collection Time   03/08/12  3:24 PM      Component Value Range Status Comment   MRSA by PCR NEGATIVE  NEGATIVE Final     Studies/Results: Ir Fluoro Rm 30-60 Min  03/14/2012  *RADIOLOGY REPORT*  Clinical Data: Need for IV access.  History of multiple central lines and currently with right upper extremity brachial vein DVT.  IR FLOURO RM 0-60 MIN  The patient was brought down to establish venous access. Initially, ultrasound was performed of both right  and left neck. The right neck was prepped with chlorhexidine.  Local anesthesia was provided with 1% lidocaine.  Under ultrasound guidance, attempt was made to cannulate a right neck collateral vein.  A guidewire was advanced through the needle.  The left upper arm was then prepped with chlorhexidine.  Ultrasound guided needle access was attempted of the left brachial vein above  the elbow.  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  Collateral veins are present.  Despite being able to puncture a collateral vein in the right neck, a guide wire would not advance sufficiently to allow central venous catheter placement.  A patent brachial vein was visualized in the left upper arm. Initial needle passage did irritate the brachial nerve, resulting in radicular pain extending into the hand.  When the vein was reattempted to be accessed from a slightly different approach, the patient refused further attempt at venous access and the procedure was aborted.  IMPRESSION: Inability to establish central or peripheral venous access as above.   Original Report Authenticated By: Irish Lack, M.D.     Medications: Scheduled Meds:    . folic acid  1 mg Oral Daily  . lacosamide  100 mg Oral BID  . levETIRAcetam  1,500 mg Oral BID  . phenytoin  200 mg Oral BID  . rivaroxaban  15 mg Oral BID  . sulfamethoxazole-trimethoprim  1 tablet Oral Q12H  . thiamine  100 mg Oral Daily  . [DISCONTINUED] enoxaparin (LOVENOX) injection  1.5 mg/kg Subcutaneous Q24H  . [DISCONTINUED] patient's guide to using coumadin book   Does not apply Once  . [DISCONTINUED] warfarin  7.5 mg Oral ONCE-1800  . [DISCONTINUED] warfarin   Does not apply Once  . [DISCONTINUED] Warfarin - Pharmacist Dosing Inpatient   Does not apply q1800   Continuous Infusions:    . dextrose 5 % and 0.45 % NaCl with KCl 20 mEq/L 10 mL/hr at 03/11/12 1900   PRN Meds:.HYDROmorphone (DILAUDID) injection, HYDROmorphone, ibuprofen, magic mouthwash, nicotine polacrilex, traMADol    Principal Problem:  *Status epilepticus, generalized convulsive Active Problems:  Seizure disorder  EtOH dependence  Drug-seeking behavior  Pain  Cellulitis  DVT (deep venous thrombosis)    LOS: 8 days  Assessment/Plan:  RUE DVT Patient was started on Lovenox and Coumadin, because of the hard to stick  we were  unable to draw the labs on the patient also he was very uncooperative for the lab work, at this time we have discontinued both Lovenox and Coumadin and started the patient on xarelto 15 mg by mouth twice a day.  Seizure disorder Continue with Keppra, Vimpat, Dilantin.  Chronic pain Continue dilaudid prn  Cellulitis Orthopedic surgery was consulted, and they did not see any signs of compartment syndrome or infection at this time. But patient continues to have fever and unfortunately he's refusing to get lab draws so we are unable to check his WBC count. Patient will be started on by mouth Bactrim.  IV access CCM and I are unable to place IV access, and patient being noncompliant we are unable to place IV access at this time. In case of emergency we can use femoral line for the IV access.  Protein C deficiency Continue xarelto  Disposition: Once we set up the necessary paperwork for the patient to get anticoagulation as outpatient he will be discharged in the morning on by mouth Bactrim for the right arm cellulitis.  [facility] Pager: [phone] 03/15/2012, 9:48 AM  Will  start the patient on xarelto, will be able to get one year supply as outpatient. Will d/c Lovenox and coumadin.

## 2012-03-15 NOTE — Progress Notes (Signed)
RN turned patient's bed alarm back on when leaving the room after giving pain medicine, patient said "please dont turn that on, well never-mind if you do, I know how to turn it off." Told patient that because he is here for seizures it is especially important that he does not turn the bed alarm off when he gets up. Patient alert and oriented, and said he wouldn't turn it off.  [name] [name], [name] [name]

## 2012-03-16 MED ORDER — FOLIC ACID 1 MG PO TABS: 1.0000 mg | ORAL_TABLET | Freq: Every day | ORAL | Status: DC

## 2012-03-16 MED ORDER — THIAMINE HCL 100 MG PO TABS: 100.0000 mg | ORAL_TABLET | Freq: Every day | ORAL | Status: DC

## 2012-03-16 MED ORDER — LEVETIRACETAM 750 MG PO TABS: 1500.0000 mg | ORAL_TABLET | Freq: Two times a day (BID) | ORAL | Status: DC

## 2012-03-16 MED ORDER — LACOSAMIDE 100 MG PO TABS: 100.0000 mg | ORAL_TABLET | Freq: Two times a day (BID) | ORAL | Status: DC

## 2012-03-16 MED ORDER — PHENYTOIN SODIUM EXTENDED 100 MG PO CAPS: 200.0000 mg | ORAL_CAPSULE | Freq: Two times a day (BID) | ORAL | Status: DC

## 2012-03-16 MED ORDER — TRAMADOL HCL 50 MG PO TABS: 50.0000 mg | ORAL_TABLET | Freq: Four times a day (QID) | ORAL | Status: DC | PRN

## 2012-03-16 MED ORDER — RIVAROXABAN 15 MG PO TABS: 15.0000 mg | ORAL_TABLET | Freq: Two times a day (BID) | ORAL | Status: DC

## 2012-03-16 MED ORDER — SULFAMETHOXAZOLE-TMP DS 800-160 MG PO TABS: 1.0000 | ORAL_TABLET | Freq: Two times a day (BID) | ORAL | Status: DC

## 2012-03-16 NOTE — Discharge Summary (Signed)
Physician Discharge Summary  [name] [name] RUE:[id number] DOB: [birth year] DOA: 03/07/2012  PCP: Sheila Oats, MD  Admit date: 03/07/2012 Discharge date: 03/16/2012  Time spent:  45  minutes  Recommendations for Outpatient Follow-up:  1. Primary care physician at [facility]  Discharge Diagnoses:  Principal Problem:  *Status epilepticus, generalized convulsive Active Problems:  Seizure disorder  EtOH dependence  Drug-seeking behavior  Pain  Cellulitis  DVT (deep venous thrombosis)   Discharge Condition:stable  Diet recommendation: regular  Filed Weights   03/08/12 0030 03/09/12 0500 03/11/12 2000  Weight: 71.3 kg (157 lb 3 oz) 75.9 kg (167 lb 5.3 oz) 71.9 kg (158 lb 8.2 oz)    History of present illness:  31 yo male with known h/o sz disorder since early childhood normally on dilantin and keppra comes in with mult seizures. In ED had at least 6 witnessed seizures. Pt states he missed one dose of his meds last night. Says he has not had any seizures in over a year. Denies any fevers/n/v/d. No abd pain no rashes. Drinks etoh regularly claims about a 6 pack a day at least 3 times a week. He has been loaded with keppra and several doses of ativan in ED and has not had a seizure in about an hour. Can talk in full sentences right now and answering questions appropriately but throughout interview is mainly concerned about getting more dilaudid iv. He bite the inside of his cheek.   Hospital Course:  Seizures Patient came with seizures and had multiple seizures in the ED, he was transferred to ICU for status epilepticus. He was loaded with Keppra,vimpat  IV and later these medications were changed to by mouth.patient remained seizure-free after he was transferred to medical floor.at this time neurology recommends continuing the patient on vimpat, Keppra and Dilantin.  Right upper extremity DVT Patient has protein C deficiency, and has collapsible veins. He only had one IV access in the  right upper extremity when he was transferred to floor, patient developed redness in the medial side of the right upper extremity. Doppler ultrasound was done which revealed right brachial vein DVT. Patient initially was started on Lovenox and Coumadin. Patient refused blood draws as he was hard to stick due to inability to find good veins for blood draws. Later patient was started on xarelto, and he will be discharged on the same.patient will be given xarelto card, with which he can get this medicine for $10 per month.  Cellulitis Patient also developed fever in the hospital, orthopedics was consulted for possible abscess in the right upper extremity. Orthopedics evaluated the patient and said  there is no abscess or compartment syndrome.patient did not have IV access, so he was switched to by mouth Bactrim. At this time he is afebrile, right arm is improving and he'll be discharged on by mouth Bactrim for 7 more days.  IV access Patient could not get IV access in the hospital despite trying central line by both critical care as well as interventional radiology, patient refused a PICC line. Despite multiple attempts by the nursing staff and IV team no IV could be established.  Alcohol abuse Patient has been counseled, and also was sent on thiamine and folic acid as outpatient.  Procedures:  Right upper extremity duplex  Consultations:  Critical care medicine  Neurology  Orthopedics  Discharge Exam: Filed Vitals:   03/14/12 2222 03/15/12 0545 03/15/12 1400 03/15/12 2213  BP: 145/95 137/80 113/64 125/64  Pulse: 86 76 69 77  Temp: 98.8 F (  37.1 C) 98.5 F (36.9 C) 97.5 F (36.4 C) 98 F (36.7 C)  TempSrc: Oral Oral Oral Oral  Resp: 16 18 18 20   Height:      Weight:      SpO2: 94% 94% 99% 98%    General: appears in no acute distress Cardiovascular: S1-S2 regular Respiratory: clear to auscultation bilaterally Abdomen: Soft nontender no organomegaly Extremity: Mild erythema,  decreasing edema in the right upper extremity  Discharge Instructions  Discharge Orders    Future Orders Please Complete By Expires   Diet - low sodium heart healthy      Increase activity slowly      Discharge instructions      Comments:   Continue to take xarelto for Right arm DVT. Follow with your pcp for more refills.       Medication List     As of 03/16/2012  1:38 PM    TAKE these medications         folic acid 1 MG tablet   Commonly known as: FOLVITE   Take 1 tablet (1 mg total) by mouth daily.      Lacosamide 100 MG Tabs   Take 1 tablet (100 mg total) by mouth 2 (two) times daily.      levETIRAcetam 750 MG tablet   Commonly known as: KEPPRA   Take 2 tablets (1,500 mg total) by mouth 2 (two) times daily.      phenytoin 100 MG ER capsule   Commonly known as: DILANTIN   Take 2 capsules (200 mg total) by mouth 2 (two) times daily.      Rivaroxaban 15 MG Tabs tablet   Commonly known as: XARELTO   Take 1 tablet (15 mg total) by mouth 2 (two) times daily. Take 15 mg po daily for 20 days then   Continue to take 20 mg po daily      sulfamethoxazole-trimethoprim 800-160 MG per tablet   Commonly known as: BACTRIM DS   Take 1 tablet by mouth every 12 (twelve) hours.      thiamine 100 MG tablet   Take 1 tablet (100 mg total) by mouth daily.      traMADol 50 MG tablet   Commonly known as: ULTRAM   Take 1 tablet (50 mg total) by mouth every 6 (six) hours as needed.          The results of significant diagnostics from this hospitalization (including imaging, microbiology, ancillary and laboratory) are listed below for reference.    Significant Diagnostic Studies: Ct Head Wo Contrast  03/07/2012  *RADIOLOGY REPORT*  Clinical Data: Seizure  CT HEAD WITHOUT CONTRAST  Technique:  Contiguous axial images were obtained from the base of the skull through the vertex without contrast.  Comparison: None.  Findings:  Ventricles are normal in size and configuration. There is  a 3.2 x 1.1 cm arachnoid cyst in the anterior left temporal region.  There is no other mass.  There is no  hemorrhage, extra- axial fluid collection, or midline shift. Gray-white compartments are normal.  Bony calvarium appears intact.  The mastoid air cells are clear.   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  Study otherwise unremarkable.   Original Report Authenticated By: Bretta Bang, M.D.    Mr [name] Wo Contrast  03/10/2012  *RADIOLOGY REPORT*  Clinical Data: Baseline seizure disorder with repeated breakthrough seizures.  Possible superimposed viral syndrome.  EtOH dependence with drug seeking behavior.  MRI HEAD WITHOUT AND WITH CONTRAST  Technique:  Multiplanar, multiecho pulse sequences of the brain and surrounding structures were obtained according to standard protocol without and with intravenous contrast  Contrast: 15mL MULTIHANCE GADOBENATE DIMEGLUMINE 529 MG/ML IV SOLN  Comparison: CT head 03/15/2012  Findings: The patient had difficulty remaining motionless for the study.  Images are suboptimal.  Small or subtle lesions could be overlooked.  There is no evidence for acute infarction, intracranial hemorrhage, mass lesion, hydrocephalus, or extra-axial fluid. There is no atrophy or white matter disease.  There are no foci of chronic hemorrhage.  Following administration of contrast, no abnormal enhancement of the brain or meninges.  Normal pituitary and cerebellar tonsils. Negative skull base and calvarium.  Clear sinuses and mastoids.  Thin section coronal images through the temporal lobe are degraded by motion.  There is no visible temporal lobe mass or inflammatory process.  There is apparent discrepancy between the CT images and MR.  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  I believe this CT appearance is a fortuitous  confluence of partial volume averaging of the left sylvian fissure simulating an extra-axial CSF  collection.   Multiple series in all three orthogonal planes fail to demonstrate an arachnoid cyst on MR.   IMPRESSION: Motion degraded examination as described.  No acute or focal intracranial abnormality.  There is no visible arachnoid cyst on today's study; prior CT appearance was related to partial volume averaging of sylvian fissure.   Original Report Authenticated By: Davonna Belling, M.D.    Ir Fluoro Rm 30-60 Min  03/14/2012  *RADIOLOGY REPORT*  Clinical Data: Need for IV access.  History of multiple central lines and currently with right upper extremity brachial vein DVT.  IR FLOURO RM 0-60 MIN  The patient was brought down to establish venous access. Initially, ultrasound was performed of both right and left neck. The right neck was prepped with chlorhexidine.  Local anesthesia was provided with 1% lidocaine.  Under ultrasound guidance, attempt was made to cannulate a right neck collateral vein.  A guidewire was advanced through the needle.  The left upper arm was then prepped with chlorhexidine.  Ultrasound guided needle access was attempted of the left brachial vein above the elbow.  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  Collateral veins are present.  Despite being able to puncture a collateral vein in the right neck, a guide wire would not advance sufficiently to allow central venous catheter placement.  A patent brachial vein was visualized in the left upper arm. Initial needle passage did irritate the brachial nerve, resulting in radicular pain extending into the hand.  When the vein was reattempted to be accessed from a slightly different approach, the patient refused further attempt at venous access and the procedure was aborted.  IMPRESSION: Inability to establish central or peripheral venous access as above.   Original Report Authenticated By: Irish Lack, M.D.    Dg Chest Port 1 View  03/09/2012  *RADIOLOGY REPORT*  Clinical Data: Atelectasis  PORTABLE  CHEST - 1 VIEW  Comparison: None.  Findings: Cardiomediastinal silhouette is unremarkable.  No acute infiltrate or pleural effusion.  No pulmonary edema.  The left costophrenic angle is not included on the film.  IMPRESSION: No active disease.   Original Report Authenticated By: Natasha Mead, M.D.     Microbiology: Recent Results (from the past 240 hour(s))  MRSA PCR SCREENING     Status: Normal   Collection Time  03/08/12  1:36 AM      Component Value Range Status Comment   MRSA by PCR NEGATIVE  NEGATIVE Final   MRSA PCR SCREENING     Status: Normal   Collection Time   03/08/12  3:24 PM      Component Value Range Status Comment   MRSA by PCR NEGATIVE  NEGATIVE Final      L    Signed:  Hendrix Yurkovich S  [facility] 03/16/2012, 1:38 PM

## 2012-03-16 NOTE — Progress Notes (Signed)
Discharged instructions given to patient and verbalized understanding. Patient waiting for sister to pick him up, no complaints and acute distress noted.

## 2012-03-16 NOTE — Progress Notes (Signed)
CARE MANAGEMENT NOTE 03/16/2012  Patient:  [name],[name]   Account Number:  [id number]  Date Initiated:  03/13/2012  Documentation initiated by:  [facility]  Subjective/Objective Assessment:   seizures     Action/Plan:   lives at home alone   Anticipated DC Date:  03/16/2012   Anticipated DC Plan:  HOME/SELF CARE      DC Planning Services  CM consult  Medication Assistance      Choice offered to / List presented to:             Status of service:  Completed, signed off Medicare Important Message given?   (If response is "NO", the following Medicare IM given date fields will be blank) Date Medicare IM given:   Date Additional Medicare IM given:    Discharge Disposition:  HOME/SELF CARE  Per UR Regulation:    If discussed at Long Length of Stay Meetings, dates discussed:    Comments:  03/16/12 12:00 Vance Peper, RN BSN Case Manager Provided patient with Xarelto discount card.     03/13/2012 1045 NCM spoke to pt and states he gets his [facility] His copay is $25.00. Did not report any difficulty with getting medications. Provided pt with Manufacturing engineer that had information on DSS, [employer] and [employer], and other resources. Provided pt with community Rx discount card that may assist with meds he pay for out of pocket. Contacted main pharmacy, pt is eligible for ZZ med assistance. NCM will determine if need for ZZ meds fund at d/c. Isidoro Donning RN CCM Case Mgmt phone [phone]

## 2012-03-17 DIAGNOSIS — G40401 Other generalized epilepsy and epileptic syndromes, not intractable, with status epilepticus: Secondary | ICD-10-CM

## 2012-03-17 DIAGNOSIS — G40909 Epilepsy, unspecified, not intractable, without status epilepticus: Secondary | ICD-10-CM

## 2012-03-17 DIAGNOSIS — F329 Major depressive disorder, single episode, unspecified: Principal | ICD-10-CM

## 2012-03-17 NOTE — Progress Notes (Signed)
Subjective: Patient is a 31 year old male who was initially admitted on 03/07/2012 with seizure due to noncompliance with his medication,  complained of right arm arm redness and pain this morning. Upper extremity duplex ordered which showed right brachial vein DVT. Patient has protein C deficiency. Patient also was seen by orthopedics for possible abscess in the right arm. But also did not think that patient had abscess or compartment syndrome. Patient developed low-grade fever and IV antibiotics were ordered for possible cellulitis, but patient has a poor IV access and was not cooperative to be stuck again again for the IV access. CCM was consulted to place a central line and patient refused twice, so today IR was consulted  place a central line, and they also were unable to put a central line. Therefore the patient to put IV access in the left arm but patient again refused. Patient has been very noncompliant and also refusing labwork to check a CBC and also PT/INR.  Patient will be started on by mouth Bactrim, patient has been started on xarelto for the DVT and we have discontinued the Lovenox and Coumadin. Patient has been afebrile. Pain is fairly well-controlled  Patient did not go home as he had no ride.His sister will come today to pick him up.  Objective: Vital signs in last 24 hours:   Weight change:  Last BM Date: 03/13/12  Consults: Neurology Critical care  Procedures: EEG  Intake/Output from previous day:     Physical Exam: Head: Normocephalic, atraumatic.  Eyes: No signs of jaundice, EOMI Nose: Mucous membranes dry.  Neck: supple,No deformities, masses, or tenderness noted. Lungs: Normal respiratory effort. B/L Clear to auscultation, no crackles or wheezes.  Heart: Regular RR. S1 and S2 normal  Abdomen: BS normoactive. Soft, Nondistended, non-tender.  Extremities: reduced erythema and induration in the medial side of right arm   No results found for this basename:  LABPROT:2,INR:2 in the last 72 hours Urine Drug Screen: Drugs of Abuse     Component Value Date/Time   LABOPIA POSITIVE* 03/07/2012 2252   COCAINSCRNUR NONE DETECTED 03/07/2012 2252   LABBENZ POSITIVE* 03/07/2012 2252   AMPHETMU NONE DETECTED 03/07/2012 2252   THCU NONE DETECTED 03/07/2012 2252   LABBARB NONE DETECTED 03/07/2012 2252    Alcohol Level: No results found for this basename: ETH:2 in the last 72 hours Urinalysis: No results found for this basename: COLORURINE:2,APPERANCEUR:2,LABSPEC:2,PHURINE:2,GLUCOSEU:2,HGBUR:2,BILIRUBINUR:2,KETONESUR:2,PROTEINUR:2,UROBILINOGEN:2,NITRITE:2,LEUKOCYTESUR:2 in the last 72 hours Misc. Labs:  Recent Results (from the past 240 hour(s))  MRSA PCR SCREENING     Status: Normal   Collection Time   03/08/12  1:36 AM      Component Value Range Status Comment   MRSA by PCR NEGATIVE  NEGATIVE Final   MRSA PCR SCREENING     Status: Normal   Collection Time   03/08/12  3:24 PM      Component Value Range Status Comment   MRSA by PCR NEGATIVE  NEGATIVE Final     Studies/Results: No results found.  Medications: Scheduled Meds:    . folic acid  1 mg Oral Daily  . lacosamide  100 mg Oral BID  . levETIRAcetam  1,500 mg Oral BID  . phenytoin  200 mg Oral BID  . rivaroxaban  15 mg Oral BID  . sulfamethoxazole-trimethoprim  1 tablet Oral Q12H  . thiamine  100 mg Oral Daily   Continuous Infusions:    . dextrose 5 % and 0.45 % NaCl with KCl 20 mEq/L 10 mL/hr at 03/11/12 1900  PRN Meds:.HYDROmorphone (DILAUDID) injection, HYDROmorphone, ibuprofen, magic mouthwash, nicotine polacrilex, traMADol    Principal Problem:  *Status epilepticus, generalized convulsive Active Problems:  Seizure disorder  EtOH dependence  Drug-seeking behavior  Pain  Cellulitis  DVT (deep venous thrombosis)    LOS: 10 days  Assessment/Plan:  RUE DVT Patient was started on Lovenox and Coumadin, because of the hard to stick  we were unable to draw the labs on the  patient also he was very uncooperative for the lab work, at this time we have discontinued both Lovenox and Coumadin and started the patient on xarelto 15 mg by mouth twice a day.  Seizure disorder Continue with Keppra, Vimpat, Dilantin.  Chronic pain Continue dilaudid prn  Cellulitis Orthopedic surgery was consulted, and they did not see any signs of compartment syndrome or infection at this time. But patient continues to have fever and unfortunately he's refusing to get lab draws so we are unable to check his WBC count. Patient will be started on by mouth Bactrim.  IV access CCM and I are unable to place IV access, and patient being noncompliant we are unable to place IV access at this time. In case of emergency we can use femoral line for the IV access.  Protein C deficiency Continue xarelto  Disposition: Patient will be discharged today.  [facility] Pager: [phone] 03/17/2012, 8:33 AM  Will start the patient on xarelto, will be able to get one year supply as outpatient. Will d/c Lovenox and coumadin.

## 2012-03-17 NOTE — Progress Notes (Signed)
   CARE MANAGEMENT NOTE 03/17/2012  Patient:  [name] [name],[name] [name]   Account Number:  [id number]  Date Initiated:  03/13/2012  Documentation initiated by:  [facility]  Subjective/Objective Assessment:   seizures     Action/Plan:   lives at home alone   Anticipated DC Date:  03/16/2012   Anticipated DC Plan:  HOME/SELF CARE      DC Planning Services  CM consult  Medication Assistance      Choice offered to / List presented to:             Status of service:  Completed, signed off Medicare Important Message given?   (If response is "NO", the following Medicare IM given date fields will be blank) Date Medicare IM given:   Date Additional Medicare IM given:    Discharge Disposition:  HOME/SELF CARE  Per UR Regulation:    If discussed at Long Length of Stay Meetings, dates discussed:    Comments:  03/17/2012 NCM spoke to pt and gave his Xarelto application for PAP program to assist with cost of medication. Provided pt with Xarelto support number and will provide pt with 3 day Rx from ZZ fund. Also Bactrim DS from ZZ fund. Isidoro Donning RN CCM Case Mgmt phone [phone]   03/16/12 12:00 Vance Peper, RN BSN Case Manager Provided patient with Xarelto discount card.  03/13/2012 1045 NCM spoke to pt and states [facility] His copay is $25.00. Did not report any difficulty with getting medications. Provided pt with Manufacturing engineer that had information on DSS, [employer], and other resources. Provided pt with community Rx discount card that may assist with meds he pay for out of pocket. Contacted main pharmacy, pt is eligible for ZZ med assistance. NCM will determine if need for ZZ meds fund at d/c. Isidoro Donning RN CCM Case Mgmt phone [phone]

## 2012-03-17 NOTE — Clinical Social Work Note (Signed)
Clinical Social Work  CSW met with pt to address transportation needs to [place], [place] at discharge. Pt has discharge orders. Pt shared the is sister will be picking him up after she gets off of work. Pt did not provide sister's phone number. CSW provided unit number for sister to contact unit and speak with charge RN. CSW offered a bus ticket, however that was declined. CSW will continue to follow to address transportation needs.   Dede Query, MSW, Theresia Majors [phone]

## 2012-03-17 NOTE — Progress Notes (Signed)
NEURO HOSPITALIST PROGRESS NOTE   SUBJECTIVE:                                                                                                                        Patient has no complaints.  He has had no further seizures.  He is having no adverse effects from the AED.   OBJECTIVE:                                                                                                                           Vital signs in last 24 hours:    Intake/Output from previous day:   Intake/Output this shift:   Nutritional status: General  Past Medical History  Diagnosis Date  . Seizure   . Protein C deficiency   . Protein C deficiency     Neurologic ROS negative with exception of above. Musculoskeletal ROS right arm discomfort secondary to DVT  Neurologic Exam:  Mental Status: Alert, oriented, thought content appropriate.  Speech fluent without evidence of aphasia.  Able to follow 3 step commands without difficulty. Cranial Nerves: II: Discs flat bilaterally; Visual fields grossly normal, pupils equal, round, reactive to light and accommodation III,IV, VI: ptosis not present, extra-ocular motions intact bilaterally V,VII: smile symmetric, facial light touch sensation normal bilaterally VIII: hearing normal bilaterally IX,X: gag reflex present XI: bilateral shoulder shrug XII: midline tongue extension Motor: Right : Upper extremity   4/5 Due to discomfort of right forearm (DVT)   Left:     Upper extremity   5/5  Lower extremity   5/5          Lower extremity   5/5 Tone and bulk:normal tone throughout; no atrophy noted Sensory: Pinprick and light touch intact throughout, bilaterally Deep Tendon Reflexes: 2+ and symmetric throughout Plantars: Right: downgoing   Left: downgoing Cerebellar: normal finger-to-nose,  normal heel-to-shin test CV: pulses palpable throughout     Lab Results: No results found for this basename: cbc, bmp, coags, chol,  tri, ldl, hga1c   Lipid Panel No results found for this basename: CHOL,TRIG,HDL,CHOLHDL,VLDL,LDLCALC in the last 72 hours  Studies/Results: No results found.  MEDICATIONS  Scheduled:   . folic acid  1 mg Oral Daily  . lacosamide  100 mg Oral BID  . levETIRAcetam  1,500 mg Oral BID  . phenytoin  200 mg Oral BID  . rivaroxaban  15 mg Oral BID  . sulfamethoxazole-trimethoprim  1 tablet Oral Q12H  . thiamine  100 mg Oral Daily    ASSESSMENT/PLAN:                                                                                                               Patient Active Hospital Problem List:  Seizure disorder (03/07/2012)   Assessment: Patient has had no further seizure activity while on Keppra 1500 mg BID, Vimpat 100 mg BID and Dilantin 200 mg BID. Most recent dilantin level 18.8 on 03/09/2012.   Plan: Continue current AED regime.  Have follow up with out patient neurologist in [place] in 2-3 weeks after discharge.  No further neurology recommendations at this time.     Felicie Morn PA-C [facility] [phone]  03/17/2012, 11:08 AM  Patient's clinical condition and management discussed.  Agree with above. Thana Farr, MD [facility] [phone]

## 2012-03-18 ENCOUNTER — Encounter (HOSPITAL_COMMUNITY): Payer: Self-pay | Admitting: Emergency Medicine

## 2012-03-18 ENCOUNTER — Emergency Department (HOSPITAL_COMMUNITY)
Admission: EM | Admit: 2012-03-18 | Discharge: 2012-03-19 | Disposition: A | Discharge status: Home / Self Care | Payer: Self-pay | Attending: Emergency Medicine | Admitting: Emergency Medicine

## 2012-03-18 DIAGNOSIS — G40909 Epilepsy, unspecified, not intractable, without status epilepticus: Secondary | ICD-10-CM | POA: Insufficient documentation

## 2012-03-18 DIAGNOSIS — Z862 Personal history of diseases of the blood and blood-forming organs and certain disorders involving the immune mechanism: Secondary | ICD-10-CM | POA: Insufficient documentation

## 2012-03-18 DIAGNOSIS — R45851 Suicidal ideations: Secondary | ICD-10-CM | POA: Insufficient documentation

## 2012-03-18 DIAGNOSIS — F39 Unspecified mood [affective] disorder: Secondary | ICD-10-CM | POA: Insufficient documentation

## 2012-03-18 DIAGNOSIS — F17200 Nicotine dependence, unspecified, uncomplicated: Secondary | ICD-10-CM | POA: Insufficient documentation

## 2012-03-18 DIAGNOSIS — Z79899 Other long term (current) drug therapy: Secondary | ICD-10-CM | POA: Insufficient documentation

## 2012-03-18 DIAGNOSIS — Z8639 Personal history of other endocrine, nutritional and metabolic disease: Secondary | ICD-10-CM | POA: Insufficient documentation

## 2012-03-18 LAB — CBC WITH DIFFERENTIAL/PLATELET
Basophils Absolute: 0 10*3/uL (ref 0.0–0.1)
Basophils Relative: 1 % (ref 0–1)
MCHC: 31.3 g/dL (ref 30.0–36.0)
Monocytes Absolute: 0.5 10*3/uL (ref 0.1–1.0)
Neutro Abs: 2.4 10*3/uL (ref 1.7–7.7)
Neutrophils Relative %: 58 % (ref 43–77)
RDW: 15.1 % (ref 11.5–15.5)

## 2012-03-18 LAB — COMPREHENSIVE METABOLIC PANEL
AST: 24 U/L (ref 0–37)
Albumin: 3.1 g/dL — ABNORMAL LOW (ref 3.5–5.2)
Chloride: 108 mEq/L (ref 96–112)
Creatinine, Ser: 0.87 mg/dL (ref 0.50–1.35)
Total Bilirubin: 0.1 mg/dL — ABNORMAL LOW (ref 0.3–1.2)
Total Protein: 6.9 g/dL (ref 6.0–8.3)

## 2012-03-18 LAB — RAPID URINE DRUG SCREEN, HOSP PERFORMED
Barbiturates: NOT DETECTED
Benzodiazepines: NOT DETECTED
Cocaine: NOT DETECTED
Tetrahydrocannabinol: NOT DETECTED

## 2012-03-18 MED ORDER — LEVETIRACETAM 750 MG PO TABS
1500.0000 mg | ORAL_TABLET | Freq: Two times a day (BID) | ORAL | Status: DC
  Administered 2012-03-18 – 2012-03-19 (×3): 1500 mg via ORAL
  Filled 2012-03-18 (×4): qty 2

## 2012-03-18 MED ORDER — SULFAMETHOXAZOLE-TMP DS 800-160 MG PO TABS
1.0000 | ORAL_TABLET | Freq: Two times a day (BID) | ORAL | Status: DC
  Administered 2012-03-18 – 2012-03-19 (×3): 1 via ORAL
  Filled 2012-03-18 (×3): qty 1

## 2012-03-18 MED ORDER — VITAMIN B-1 100 MG PO TABS
100.0000 mg | ORAL_TABLET | Freq: Every day | ORAL | Status: DC
  Administered 2012-03-18 – 2012-03-19 (×2): 100 mg via ORAL
  Filled 2012-03-18 (×2): qty 1

## 2012-03-18 MED ORDER — LACOSAMIDE 50 MG PO TABS
100.0000 mg | ORAL_TABLET | Freq: Two times a day (BID) | ORAL | Status: DC
  Administered 2012-03-18 – 2012-03-19 (×3): 100 mg via ORAL
  Filled 2012-03-18 (×2): qty 2
  Filled 2012-03-18: qty 1

## 2012-03-18 MED ORDER — PHENYTOIN SODIUM EXTENDED 100 MG PO CAPS
200.0000 mg | ORAL_CAPSULE | Freq: Two times a day (BID) | ORAL | Status: DC
  Administered 2012-03-18 – 2012-03-19 (×3): 200 mg via ORAL
  Filled 2012-03-18 (×3): qty 2

## 2012-03-18 MED ORDER — RIVAROXABAN 15 MG PO TABS
15.0000 mg | ORAL_TABLET | Freq: Two times a day (BID) | ORAL | Status: DC
  Administered 2012-03-18 – 2012-03-19 (×3): 15 mg via ORAL
  Filled 2012-03-18 (×4): qty 1

## 2012-03-18 NOTE — BH Assessment (Signed)
Assessment Note   [name] [name] is an 31 y.o. male. Patient presents depressed and suicidal with a plan to overdose on his medications. Patient lives alone and has only been in [place] since July of this year. Patient's 4yo daughter and child's mother were killed in a MVA on July 6th of this year. Since then patient states that he has increased feelings of hopelessness, helpless, insomnia, and decreased desire to live. 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 He states that his symptoms have increased over the past 2 weeks. He has not worked in the past 2 weeks, is only sleeping 2 hours per night, endorses decreased appetite and increasing thoughts of just wanting to "take a bunch of pills" and kill himself. Patient states that he does not feel safe being alone.  Patient's mother killed herself via overdose in 2001. Patient needs inpatient hospitalization for crisis stabilization.  Axis I: Major Depression, single episode Axis II: Deferred Axis III:  Past Medical History  Diagnosis Date  . Seizure   . Protein C deficiency   . Protein C deficiency    Axis IV: Grief Axis V: 30  Past Medical History:  Past Medical History  Diagnosis Date  . Seizure   . Protein C deficiency   . Protein C deficiency     Past Surgical History  Procedure Date  . Insertion of vena cava filter     Family History: No family history on file.  Social History:  reports that he has been smoking Cigarettes.  He has been smoking about .25 packs per day. He does not have any smokeless tobacco history on file. He reports that he drinks about 3.6 ounces of alcohol per week. He reports that he uses illicit drugs (Marijuana).  Additional Social History:  Alcohol / Drug Use History of alcohol / drug use?: No history of alcohol / drug abuse  CIWA: CIWA-Ar BP: 94/52 mmHg Pulse Rate: 61  COWS:    Allergies:  Allergies  Allergen  Reactions  . Fish Allergy Anaphylaxis and Rash  . Depakote (Divalproex Sodium) Swelling  . Morphine And Related Hives    Home Medications:  (Not in a hospital admission)  OB/GYN Status:  No LMP for male patient.  General Assessment Data Location of Assessment: [facility] ED ACT Assessment: Yes Living Arrangements: Alone Can pt return to current living arrangement?: Yes Admission Status: Voluntary Is patient capable of signing voluntary admission?: Yes Transfer from: Home Referral Source: MD  Education Status Is patient currently in school?: No Contact person:  Catering manager Lich/ [phone])  Risk to self Suicidal Ideation: Yes-Currently Present Suicidal Intent: Yes-Currently Present Is patient at risk for suicide?: Yes Suicidal Plan?: Yes-Currently Present Specify Current Suicidal Plan:  ("take a lot of pills") Access to Means: Yes Specify Access to Suicidal Means:  (medications) What has been your use of drugs/alcohol within the last 12 months?:  (Denies) Previous Attempts/Gestures: No How many times?:  (None reported) Other Self Harm Risks:  (None reported) Triggers for Past Attempts: None known Intentional Self Injurious Behavior: None Family Suicide History:  (Mother killed self via overdose in 2001) Recent stressful life event(s): Loss (Comment) (4yo daughter and baby's mother killed in MVA) Persecutory voices/beliefs?: No Depression: Yes Depression Symptoms: Despondent;Isolating;Fatigue;Guilt;Loss of interest in usual pleasures;Feeling worthless/self pity;Insomnia Substance abuse history and/or treatment for substance abuse?: No Suicide prevention information given to non-admitted patients: Not applicable  Risk to Others  Homicidal Ideation: No Thoughts of Harm to Others: No Current Homicidal Intent: No Current Homicidal Plan: No Access to Homicidal Means: No Identified Victim:  (Na) History of harm to others?: No Assessment of Violence: None Noted Violent  Behavior Description:  (None) Does patient have access to weapons?: No Criminal Charges Pending?: No Does patient have a court date: No  Psychosis Hallucinations: None noted Delusions: None noted  Mental Status Report Appear/Hygiene:  (WNL) Eye Contact: Fair Motor Activity: Freedom of movement;Unremarkable Speech: Logical/coherent;Soft Level of Consciousness: Quiet/awake;Alert Mood: Depressed;Helpless;Sad;Worthless, low self-esteem Affect: Appropriate to circumstance;Depressed;Sad Anxiety Level: None Thought Processes: Coherent;Relevant Judgement: Impaired Orientation: Person;Place;Situation;Time Obsessive Compulsive Thoughts/Behaviors: None  Cognitive Functioning Concentration: Decreased Memory: Recent Intact;Remote Intact IQ: Average Insight: Poor Impulse Control: Poor Appetite: Fair Weight Loss:  (None reported) Weight Gain:  (None reported) Sleep: Decreased Total Hours of Sleep:  (2 hours broken) Vegetative Symptoms: Decreased grooming;Staying in bed  ADLScreening [facility] Assessment Services) Patient's cognitive ability adequate to safely complete daily activities?: Yes Patient able to express need for assistance with ADLs?: Yes Independently performs ADLs?: Yes (appropriate for developmental age)  Abuse/Neglect [facility]) Physical Abuse: Denies Verbal Abuse: Denies Sexual Abuse: Denies  Prior Inpatient Therapy Prior Inpatient Therapy: No  Prior Outpatient Therapy Prior Outpatient Therapy: No  ADL Screening (condition at time of admission) Patient's cognitive ability adequate to safely complete daily activities?: Yes Patient able to express need for assistance with ADLs?: Yes Independently performs ADLs?: Yes (appropriate for developmental age)       Abuse/Neglect Assessment (Assessment to be complete while patient is alone) Physical Abuse: Denies Verbal Abuse: Denies Sexual Abuse: Denies Exploitation of patient/patient's resources: Denies Self-Neglect:  Denies     Merchant navy officer (For Healthcare) Advance Directive: Patient does not have advance directive;Patient would not like information    Additional Information 1:1 In Past 12 Months?: No CIRT Risk: No Elopement Risk: No Does patient have medical clearance?: Yes     Disposition:  Disposition Disposition of Patient: Inpatient treatment program Type of inpatient treatment program: Adult  On Site Evaluation by:   Reviewed with Physician:     Rudi Coco 03/18/2012 4:27 PM

## 2012-03-18 NOTE — ED Notes (Signed)
Pt stated he was not hungry- meal tray ordered in case pt changes his mind.

## 2012-03-18 NOTE — ED Notes (Signed)
Pt resting with sitter at bedside.  

## 2012-03-18 NOTE — ED Notes (Signed)
Urine sample requested.  Urinal supplied.  Patient states he is unable to void at this time.

## 2012-03-18 NOTE — ED Notes (Signed)
Report received from Berkley,RN 

## 2012-03-18 NOTE — Progress Notes (Signed)
Tried to get from patient contact information of sister but patient refused to give info.Patient left his room alone without her sister who is supposed to pick-up patient. All discharged instructions were given 03/16/12 and patient verbalized understanding.

## 2012-03-18 NOTE — ED Notes (Signed)
PT. REPORTS SUICIDAL IDEATION ONSET SEVERAL DAYS AGO , PT.STATED HE IS DEPRESSED AND PLANS TO OVERDOSE ON HIS MEDICATIONS.  CHARGE NURSE NOTIFIED FOR PT.'S SITTER.

## 2012-03-18 NOTE — ED Provider Notes (Signed)
History     CSN: [id number]  Arrival date & time 03/18/12  0705   First MD Initiated Contact with Patient 03/18/12 [phone]      Chief Complaint  Patient presents with  . Suicidal     Patient is a 31 y.o. male presenting with mental health disorder. The history is provided by the patient.  Mental Health Problem The primary symptoms include dysphoric mood. The current episode started today. This is a new problem.  The degree of incapacity that he is experiencing as a consequence of his illness is moderate. Additional symptoms of the illness do not include no abdominal pain or no seizures. He admits to suicidal ideas. He does have a plan to commit suicide.  pt reports feeling depressed and suicidal His course is worsening He reports he plans to take all of his anticoagulants to harm himself He denies cp/sob.  No Ha.  No seizures today and he reports med compliance No fever/vomiting is reported   Past Medical History  Diagnosis Date  . Seizure   . Protein C deficiency   . Protein C deficiency     Past Surgical History  Procedure Date  . Insertion of vena cava filter     No family history on file.  History  Substance Use Topics  . Smoking status: Current Every Day Smoker -- 0.2 packs/day    Types: Cigarettes  . Smokeless tobacco: Not on file  . Alcohol Use: 3.6 oz/week    6 Cans of beer per week      Review of Systems  Gastrointestinal: Negative for abdominal pain.  Neurological: Negative for seizures.  Psychiatric/Behavioral: Positive for dysphoric mood.  All other systems reviewed and are negative.    Allergies  Fish allergy; Depakote; and Morphine and related  Home Medications   Current Outpatient Rx  Name  Route  Sig  Dispense  Refill  . LACOSAMIDE 100 MG PO TABS   Oral   Take 1 tablet (100 mg total) by mouth 2 (two) times daily.   60 tablet   2   . LEVETIRACETAM 750 MG PO TABS   Oral   Take 2 tablets (1,500 mg total) by mouth 2 (two) times  daily.   120 tablet   3   . PHENYTOIN SODIUM EXTENDED 100 MG PO CAPS   Oral   Take 2 capsules (200 mg total) by mouth 2 (two) times daily.   60 capsule   2   . RIVAROXABAN 15 MG PO TABS   Oral   Take 1 tablet (15 mg total) by mouth 2 (two) times daily. Take 15 mg po daily for 20 days then  Continue to take 20 mg po daily   30 tablet   3   . SULFAMETHOXAZOLE-TMP DS 800-160 MG PO TABS   Oral   Take 1 tablet by mouth every 12 (twelve) hours.   14 tablet   0   . THIAMINE HCL 100 MG PO TABS   Oral   Take 1 tablet (100 mg total) by mouth daily.   30 tablet   0   . TRAMADOL HCL 50 MG PO TABS   Oral   Take 1 tablet (50 mg total) by mouth every 6 (six) hours as needed.   30 tablet   0     BP 145/93  Pulse 68  Temp 97.6 F (36.4 C) (Oral)  Resp 14  SpO2 97%  Physical Exam CONSTITUTIONAL: Well developed/well nourished HEAD AND FACE: Normocephalic/atraumatic EYES:  EOMI/PERRL ENMT: Mucous membranes moist NECK: supple no meningeal signs CV: S1/S2 noted, no murmurs/rubs/gallops noted LUNGS: Lungs are clear to auscultation bilaterally, no apparent distress ABDOMEN: soft, nontender, no rebound or guarding GU:no cva tenderness NEURO: Pt is awake/alert, moves all extremitiesx4 EXTREMITIES: pulses normal, full ROM SKIN: warm, color normal PSYCH:flat affect  ED Course  Procedures   Labs Reviewed  ETHANOL  URINE RAPID DRUG SCREEN (HOSP PERFORMED)  CBC WITH DIFFERENTIAL  COMPREHENSIVE METABOLIC PANEL  PHENYTOIN LEVEL, TOTAL  8:05 AM Pt here for SI.  He plans to take all of his meds including xarelto to harm himself He was just discharged from hospital for seizures.  He reports med compliance.  He also has h/o DVT in right UE and is on xarelto Will follow closely Sitter to bedside 10:45 AM D/w ACT, will see patient.  Recommend admission as pt is high risk to harm himself All of home meds restarted except for his tramadol due to risk for seizure    MDM  Nursing  notes including past medical history and social history reviewed and considered in documentation Previous records reviewed and considered Labs/vital reviewed and considered         Joya Gaskins, MD 03/18/12 1046

## 2012-03-18 NOTE — ED Notes (Addendum)
Pt changed into paper scrubs and security called to wand pt. Phlebotomy in room at this time. Pt states he is depressed and "having a hard time", reports his daughter died recently and he "can't handle it anymore". Pt d/c from hospital admission yesterday. Reports he took his keppra and dilantin this AM prior to ED arrival.

## 2012-03-18 NOTE — ED Notes (Signed)
Vitals not done due to pt sleeping; will try again within the hour;

## 2012-03-19 ENCOUNTER — Inpatient Hospital Stay (HOSPITAL_COMMUNITY)
Admission: EM | Admit: 2012-03-19 | Discharge: 2012-03-20 | DRG: 885 | Disposition: A | Discharge status: Home / Self Care | Payer: Federal, State, Local not specified - Other | Source: Ambulatory Visit | Attending: Emergency Medicine | Admitting: Emergency Medicine

## 2012-03-19 ENCOUNTER — Encounter (HOSPITAL_COMMUNITY): Payer: Self-pay

## 2012-03-19 DIAGNOSIS — Z765 Malingerer [conscious simulation]: Secondary | ICD-10-CM | POA: Diagnosis present

## 2012-03-19 DIAGNOSIS — F1910 Other psychoactive substance abuse, uncomplicated: Secondary | ICD-10-CM | POA: Diagnosis present

## 2012-03-19 DIAGNOSIS — G40909 Epilepsy, unspecified, not intractable, without status epilepticus: Secondary | ICD-10-CM | POA: Diagnosis present

## 2012-03-19 DIAGNOSIS — F329 Major depressive disorder, single episode, unspecified: Principal | ICD-10-CM | POA: Diagnosis present

## 2012-03-19 DIAGNOSIS — I82409 Acute embolism and thrombosis of unspecified deep veins of unspecified lower extremity: Secondary | ICD-10-CM | POA: Diagnosis present

## 2012-03-19 DIAGNOSIS — R569 Unspecified convulsions: Secondary | ICD-10-CM | POA: Diagnosis present

## 2012-03-19 DIAGNOSIS — G4700 Insomnia, unspecified: Secondary | ICD-10-CM | POA: Diagnosis present

## 2012-03-19 DIAGNOSIS — R52 Pain, unspecified: Secondary | ICD-10-CM | POA: Diagnosis present

## 2012-03-19 DIAGNOSIS — F1020 Alcohol dependence, uncomplicated: Secondary | ICD-10-CM | POA: Diagnosis present

## 2012-03-19 MED ORDER — NICOTINE POLACRILEX 2 MG MT GUM
2.0000 mg | CHEWING_GUM | OROMUCOSAL | Status: DC | PRN
Start: 2012-03-19 — End: 2012-03-20
  Administered 2012-03-19 – 2012-03-20 (×3): 2 mg via ORAL

## 2012-03-19 MED ORDER — TRAZODONE HCL 50 MG PO TABS
50.0000 mg | ORAL_TABLET | Freq: Every evening | ORAL | Status: DC | PRN
  Administered 2012-03-19: 50 mg via ORAL
  Filled 2012-03-19 (×4): qty 1

## 2012-03-19 MED ORDER — ALUM & MAG HYDROXIDE-SIMETH 200-200-20 MG/5ML PO SUSP: 30.0000 mL | ORAL | Status: DC | PRN

## 2012-03-19 MED ORDER — MAGNESIUM HYDROXIDE 400 MG/5ML PO SUSP: 30.0000 mL | Freq: Every day | ORAL | Status: DC | PRN

## 2012-03-19 MED ORDER — ACETAMINOPHEN 325 MG PO TABS: 650.0000 mg | ORAL_TABLET | Freq: Four times a day (QID) | ORAL | Status: DC | PRN
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
## 2012-03-19 NOTE — Progress Notes (Signed)
D: Patient in day room at the beginning of the shift. He reported getting adjusted to the unit; although heft titred. Mood and affect flat and depressed. Denied SI/Hi and denied halucinations. A: Encouraged and supported patient. R: Receptive to encouragement. Q 15 minute check continues to maintain safety.

## 2012-03-19 NOTE — ED Notes (Signed)
D: Pt resting, eyes closed, respirations even and unlabored. No distress noted. A: Continue 1:1 for pt safety. R: Pt remains safe on the unit.   

## 2012-03-19 NOTE — ED Provider Notes (Signed)
  Physical Exam  BP 96/58  Pulse 62  Temp 98.1 F (36.7 C) (Oral)  Resp 20  SpO2 97%  Physical Exam  ED Course  Procedures  MDM Patient was sleeping comfortably this morning. He's been accepted at [facility] pending a bed      [employer]. Rubin Payor, MD 03/19/12 1419

## 2012-03-19 NOTE — Tx Team (Signed)
Initial Interdisciplinary Treatment Plan  PATIENT STRENGTHS: (choose at least two) Ability for insight General fund of knowledge Supportive family/friends Work skills  PATIENT STRESSORS:    PROBLEM LIST: Problem List/Patient Goals Date to be addressed Date deferred Reason deferred Estimated date of resolution  Suicidal Ideation      Depression                                                 DISCHARGE CRITERIA:  Improved stabilization in mood, thinking, and/or behavior Motivation to continue treatment in a less acute level of care  PRELIMINARY DISCHARGE PLAN: Outpatient therapy Participate in family therapy  PATIENT/FAMIILY INVOLVEMENT: This treatment plan has been presented to and reviewed with the patient, [name] [name], and/or family member, .  The patient and family have been given the opportunity to ask questions and make suggestions.  [name] 03/19/2012, 4:42 PM

## 2012-03-19 NOTE — BH Assessment (Signed)
Assessment Note Update:  Received call from [facility] stating pt accepted by Dr. Dub Mikes to Dr. Daleen Bo to bed 505-1 and that pt could be transported to [facility].  Updated EDP Pickering and ED staff.  Updated assessment disposition, completed assessment notification and support paperwork, faxed to [facility] to log.  ED staff to arrange transprot to [facility] via security, as pt is voluntary.     Disposition:  Disposition Disposition of Patient: Inpatient treatment program Type of inpatient treatment program: Adult (Pt accepted [facility])  On Site Evaluation by:   Reviewed with Physician:  Thornton Papas, Rennis Harding 03/19/2012 3:06 PM

## 2012-03-20 ENCOUNTER — Encounter (HOSPITAL_COMMUNITY): Payer: Self-pay | Admitting: Internal Medicine

## 2012-03-20 ENCOUNTER — Encounter (HOSPITAL_COMMUNITY): Payer: Self-pay | Admitting: Emergency Medicine

## 2012-03-20 ENCOUNTER — Inpatient Hospital Stay (HOSPITAL_COMMUNITY)
Admission: AD | Admit: 2012-03-20 | Discharge: 2012-03-23 | DRG: 101 | Disposition: A | Discharge status: Other Acute Inpatient Hospital | Payer: MEDICAID | Source: Ambulatory Visit | Attending: Internal Medicine | Admitting: Internal Medicine

## 2012-03-20 DIAGNOSIS — D6859 Other primary thrombophilia: Secondary | ICD-10-CM | POA: Diagnosis present

## 2012-03-20 DIAGNOSIS — F1210 Cannabis abuse, uncomplicated: Secondary | ICD-10-CM | POA: Diagnosis present

## 2012-03-20 DIAGNOSIS — F329 Major depressive disorder, single episode, unspecified: Secondary | ICD-10-CM | POA: Diagnosis present

## 2012-03-20 DIAGNOSIS — Z79899 Other long term (current) drug therapy: Secondary | ICD-10-CM

## 2012-03-20 DIAGNOSIS — G40909 Epilepsy, unspecified, not intractable, without status epilepticus: Secondary | ICD-10-CM | POA: Diagnosis present

## 2012-03-20 DIAGNOSIS — F322 Major depressive disorder, single episode, severe without psychotic features: Secondary | ICD-10-CM

## 2012-03-20 DIAGNOSIS — F1020 Alcohol dependence, uncomplicated: Secondary | ICD-10-CM | POA: Diagnosis present

## 2012-03-20 DIAGNOSIS — R45851 Suicidal ideations: Secondary | ICD-10-CM

## 2012-03-20 DIAGNOSIS — I82409 Acute embolism and thrombosis of unspecified deep veins of unspecified lower extremity: Secondary | ICD-10-CM | POA: Diagnosis present

## 2012-03-20 DIAGNOSIS — G40401 Other generalized epilepsy and epileptic syndromes, not intractable, with status epilepticus: Principal | ICD-10-CM | POA: Diagnosis present

## 2012-03-20 DIAGNOSIS — R51 Headache: Secondary | ICD-10-CM | POA: Diagnosis present

## 2012-03-20 DIAGNOSIS — F17200 Nicotine dependence, unspecified, uncomplicated: Secondary | ICD-10-CM | POA: Diagnosis present

## 2012-03-20 DIAGNOSIS — R569 Unspecified convulsions: Secondary | ICD-10-CM

## 2012-03-20 DIAGNOSIS — F1910 Other psychoactive substance abuse, uncomplicated: Secondary | ICD-10-CM

## 2012-03-20 DIAGNOSIS — G40901 Epilepsy, unspecified, not intractable, with status epilepticus: Secondary | ICD-10-CM

## 2012-03-20 LAB — CBC WITH DIFFERENTIAL/PLATELET
Basophils Absolute: 0 10*3/uL (ref 0.0–0.1)
Basophils Relative: 1 % (ref 0–1)
Eosinophils Absolute: 0.1 10*3/uL (ref 0.0–0.7)
Hemoglobin: 9 g/dL — ABNORMAL LOW (ref 13.0–17.0)
MCHC: 27.1 g/dL — ABNORMAL LOW (ref 30.0–36.0)
Neutro Abs: 2.8 10*3/uL (ref 1.7–7.7)
Neutrophils Relative %: 65 % (ref 43–77)
Platelets: 269 10*3/uL (ref 150–400)
RDW: 15.5 % (ref 11.5–15.5)

## 2012-03-20 LAB — PHENYTOIN LEVEL, TOTAL: Phenytoin Lvl: 2.9 ug/mL — ABNORMAL LOW (ref 10.0–20.0)

## 2012-03-20 LAB — BASIC METABOLIC PANEL
Chloride: 108 mEq/L (ref 96–112)
GFR calc Af Amer: >90 mL/min (ref >90)
GFR calc non Af Amer: >90 mL/min (ref >90)
Potassium: 4.1 mEq/L (ref 3.5–5.1)
Sodium: 138 mEq/L (ref 135–145)

## 2012-03-20 MED ORDER — ENOXAPARIN SODIUM 80 MG/0.8ML ~~LOC~~ SOLN
70.0000 mg | Freq: Two times a day (BID) | SUBCUTANEOUS | Status: DC
  Filled 2012-03-20: qty 0.8

## 2012-03-20 MED ORDER — HYDROMORPHONE HCL PF 2 MG/ML IJ SOLN
2.0000 mg | Freq: Once | INTRAMUSCULAR | Status: AC
  Administered 2012-03-20: 2 mg via INTRAMUSCULAR
  Filled 2012-03-20: qty 1

## 2012-03-20 MED ORDER — LEVETIRACETAM 750 MG PO TABS
1500.0000 mg | ORAL_TABLET | Freq: Two times a day (BID) | ORAL | Status: DC
  Filled 2012-03-20 (×3): qty 2

## 2012-03-20 MED ORDER — ONDANSETRON HCL 4 MG/2ML IJ SOLN: 4.0000 mg | Freq: Four times a day (QID) | INTRAMUSCULAR | Status: DC | PRN

## 2012-03-20 MED ORDER — PHENYTOIN SODIUM EXTENDED 100 MG PO CAPS
200.0000 mg | ORAL_CAPSULE | Freq: Two times a day (BID) | ORAL | Status: DC
  Administered 2012-03-21 – 2012-03-23 (×5): 200 mg via ORAL
  Filled 2012-03-20 (×6): qty 2

## 2012-03-20 MED ORDER — LORAZEPAM 2 MG/ML IJ SOLN: 2.0000 mg | Freq: Once | INTRAMUSCULAR | Status: DC

## 2012-03-20 MED ORDER — ADULT MULTIVITAMIN W/MINERALS CH
1.0000 | ORAL_TABLET | Freq: Every day | ORAL | Status: DC
  Administered 2012-03-21 – 2012-03-23 (×3): 1 via ORAL
  Filled 2012-03-20 (×3): qty 1

## 2012-03-20 MED ORDER — ONDANSETRON HCL 4 MG PO TABS: 4.0000 mg | ORAL_TABLET | Freq: Four times a day (QID) | ORAL | Status: DC | PRN

## 2012-03-20 MED ORDER — PHENYTOIN SODIUM EXTENDED 100 MG PO CAPS: 200.0000 mg | ORAL_CAPSULE | Freq: Two times a day (BID) | ORAL | Status: DC

## 2012-03-20 MED ORDER — VITAMIN B-1 100 MG PO TABS
100.0000 mg | ORAL_TABLET | Freq: Every day | ORAL | Status: DC
  Administered 2012-03-21 – 2012-03-23 (×3): 100 mg via ORAL
  Filled 2012-03-20 (×3): qty 1

## 2012-03-20 MED ORDER — LEVETIRACETAM 750 MG PO TABS
1500.0000 mg | ORAL_TABLET | Freq: Two times a day (BID) | ORAL | Status: DC
  Administered 2012-03-20 – 2012-03-23 (×6): 1500 mg via ORAL
  Filled 2012-03-20 (×8): qty 2

## 2012-03-20 MED ORDER — PHENYTOIN SODIUM EXTENDED 100 MG PO CAPS
200.0000 mg | ORAL_CAPSULE | Freq: Two times a day (BID) | ORAL | Status: DC
Start: 2012-03-20 — End: 2012-03-20
  Filled 2012-03-20 (×3): qty 2

## 2012-03-20 MED ORDER — TRAMADOL HCL 50 MG PO TABS: 50.0000 mg | ORAL_TABLET | Freq: Four times a day (QID) | ORAL | Status: DC | PRN

## 2012-03-20 MED ORDER — RIVAROXABAN 15 MG PO TABS: 15.0000 mg | ORAL_TABLET | Freq: Two times a day (BID) | ORAL | Status: DC

## 2012-03-20 MED ORDER — HYDROMORPHONE HCL PF 1 MG/ML IJ SOLN: 1.0000 mg | INTRAMUSCULAR | Status: DC | PRN

## 2012-03-20 MED ORDER — LACOSAMIDE 50 MG PO TABS
100.0000 mg | ORAL_TABLET | Freq: Two times a day (BID) | ORAL | Status: DC
  Filled 2012-03-20: qty 2

## 2012-03-20 MED ORDER — PHENYTOIN 50 MG PO CHEW
400.0000 mg | CHEWABLE_TABLET | ORAL | Status: AC
  Administered 2012-03-20: 400 mg via ORAL
  Filled 2012-03-20: qty 8

## 2012-03-20 MED ORDER — SODIUM CHLORIDE 0.9 % IV SOLN: INTRAVENOUS | Status: DC

## 2012-03-20 MED ORDER — THIAMINE HCL 100 MG/ML IJ SOLN
100.0000 mg | Freq: Every day | INTRAMUSCULAR | Status: DC
  Filled 2012-03-20 (×3): qty 1

## 2012-03-20 MED ORDER — LORAZEPAM 1 MG PO TABS: 1.0000 mg | ORAL_TABLET | Freq: Four times a day (QID) | ORAL | Status: DC | PRN

## 2012-03-20 MED ORDER — ADULT MULTIVITAMIN W/MINERALS CH: 1.0000 | ORAL_TABLET | Freq: Every day | ORAL | Status: DC

## 2012-03-20 MED ORDER — LACOSAMIDE 50 MG PO TABS
100.0000 mg | ORAL_TABLET | Freq: Two times a day (BID) | ORAL | Status: DC
  Administered 2012-03-20 – 2012-03-21 (×2): 100 mg via ORAL
  Filled 2012-03-20 (×2): qty 2

## 2012-03-20 MED ORDER — DIAZEPAM 10 MG RE GEL
RECTAL | Status: AC
  Filled 2012-03-20: qty 10

## 2012-03-20 MED ORDER — HYDROMORPHONE HCL PF 1 MG/ML IJ SOLN
1.0000 mg | INTRAMUSCULAR | Status: DC | PRN
  Administered 2012-03-20 – 2012-03-21 (×5): 1 mg via INTRAMUSCULAR
  Filled 2012-03-20 (×5): qty 1

## 2012-03-20 MED ORDER — LACOSAMIDE 50 MG PO TABS: 100.0000 mg | ORAL_TABLET | Freq: Two times a day (BID) | ORAL | Status: DC

## 2012-03-20 MED ORDER — ACETAMINOPHEN 650 MG RE SUPP: 650.0000 mg | Freq: Four times a day (QID) | RECTAL | Status: DC | PRN

## 2012-03-20 MED ORDER — LEVETIRACETAM 750 MG PO TABS
1500.0000 mg | ORAL_TABLET | Freq: Two times a day (BID) | ORAL | Status: DC
  Administered 2012-03-20: 1500 mg via ORAL
  Filled 2012-03-20: qty 2

## 2012-03-20 MED ORDER — LORAZEPAM 2 MG/ML IJ SOLN: 1.0000 mg | Freq: Four times a day (QID) | INTRAMUSCULAR | Status: DC | PRN

## 2012-03-20 MED ORDER — DIAZEPAM 10 MG RE GEL
10.0000 mg | Freq: Once | RECTAL | Status: AC
  Administered 2012-03-20: 10 mg via RECTAL

## 2012-03-20 MED ORDER — NICOTINE POLACRILEX 2 MG MT GUM
2.0000 mg | CHEWING_GUM | OROMUCOSAL | Status: DC | PRN
  Administered 2012-03-21 – 2012-03-23 (×11): 2 mg via ORAL
  Filled 2012-03-20 (×8): qty 1

## 2012-03-20 MED ORDER — FOLIC ACID 1 MG PO TABS
1.0000 mg | ORAL_TABLET | Freq: Every day | ORAL | Status: DC
  Administered 2012-03-21 – 2012-03-23 (×3): 1 mg via ORAL
  Filled 2012-03-20 (×3): qty 1

## 2012-03-20 MED ORDER — RIVAROXABAN 20 MG PO TABS: 20.0000 mg | ORAL_TABLET | Freq: Every day | ORAL | Status: DC

## 2012-03-20 MED ORDER — PHENYTOIN 50 MG PO CHEW
400.0000 mg | CHEWABLE_TABLET | ORAL | Status: DC
  Administered 2012-03-20 (×2): 400 mg via ORAL
  Filled 2012-03-20 (×3): qty 8

## 2012-03-20 MED ORDER — FOLIC ACID 1 MG PO TABS: 1.0000 mg | ORAL_TABLET | Freq: Every day | ORAL | Status: DC

## 2012-03-20 MED ORDER — OXYCODONE-ACETAMINOPHEN 5-325 MG PO TABS
1.0000 | ORAL_TABLET | Freq: Once | ORAL | Status: AC
  Administered 2012-03-20: 1 via ORAL
  Filled 2012-03-20: qty 1

## 2012-03-20 MED ORDER — VITAMIN B-1 100 MG PO TABS: 100.0000 mg | ORAL_TABLET | Freq: Every day | ORAL | Status: DC

## 2012-03-20 MED ORDER — ENOXAPARIN SODIUM 80 MG/0.8ML ~~LOC~~ SOLN
70.0000 mg | Freq: Two times a day (BID) | SUBCUTANEOUS | Status: DC
  Filled 2012-03-20 (×3): qty 0.8

## 2012-03-20 MED ORDER — SULFAMETHOXAZOLE-TMP DS 800-160 MG PO TABS
1.0000 | ORAL_TABLET | Freq: Two times a day (BID) | ORAL | Status: AC
  Administered 2012-03-20 – 2012-03-23 (×6): 1 via ORAL
  Filled 2012-03-20 (×6): qty 1

## 2012-03-20 MED ORDER — LEVETIRACETAM 750 MG PO TABS: 1500.0000 mg | ORAL_TABLET | Freq: Two times a day (BID) | ORAL | Status: DC

## 2012-03-20 MED ORDER — LORAZEPAM 2 MG/ML IJ SOLN
2.0000 mg | Freq: Once | INTRAMUSCULAR | Status: AC
  Administered 2012-03-20: 2 mg via INTRAMUSCULAR

## 2012-03-20 MED ORDER — ACETAMINOPHEN 325 MG PO TABS
650.0000 mg | ORAL_TABLET | Freq: Four times a day (QID) | ORAL | Status: DC | PRN
  Administered 2012-03-21: 650 mg via ORAL
  Filled 2012-03-20: qty 2

## 2012-03-20 NOTE — ED Notes (Signed)
RUE:AV40<JW> Expected date:03/20/12<BR> Expected time:10:09 AM<BR> Means of arrival:Ambulance<BR> Comments:<BR> Hold for [facility] patient

## 2012-03-20 NOTE — Plan of Care (Addendum)
Name: [name] [name] MRN: [id number] PCP: Sheila Oats, MD  31 year old male transferred from behavioral health services on Hospital for management of his seizure.  Unfortunately patient's orders were not carried through with the transfer.  Discussed with Dr. Amada Jupiter, neurology, reinitiated his seizure medications after receiving an loading dose in the emergency department.  Patient placed on Lovenox rather than Rivaroxaban, for RUE DVT.  Per pharmacy there is interaction between Dilantin and Rivaroxaban.  Patient reports given his seizure that he broke part of his tooth (molar) on the lower right mandible, instructed him to followup with a dentist after discharge for further management.  Patient by my count is on 3 more days of Bactrim will defer to the rounding physician in the morning if they want to extend antibiotics for cellulitis or RUE. Admit to telemetry with sitter for suicide and seizure precautions.  CIWA precautions for alcohol use.  Vital reviewed.  Physical Exam: General: Awake, Oriented, No acute distress. HEENT: EOMI, poor dentition chiped right lower molar tooth. Neck: Supple CV: S1 and S2 Lungs: Clear to ascultation bilaterally Abdomen: Soft, Nontender, Nondistended, +bowel sounds. Ext: Good pulses. Trace edema.  Right upper extremity slightly swollen, no clear erythematous area noted.  Enoch Moffa A, MD 03/20/2012, 10:29 PM  Addendum: Was called by the nurse stating that the patient declined his Lovenox injection.  Spoke to the patient who indicated that he was advised by his hematologist that he should not be on Lovenox he has had some bleeding problems.  Also discussed with pharmacy about Rivaroxaban and Dilantin interaction.  Pharmacy indicated that Dilantin interferes with cytochrome 450 3A4 and decreases the availability of Rivaroxaban.  Risks of not taking Lovenox injection were discussed with the patient including leading to pulmonary embolism and death,  patient indicated that he understood the risks.  Will defer to the morning rounding physician about addressing Lovenox versus Rivaroxaban with pharmacy, may consider hematology evaluation.  Trajan Grove A, MD 03/21/2012, 12:26 AM
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
## 2012-03-20 NOTE — ED Notes (Signed)
Dr Effie Shy at bedside. Attempting IV in Left upper arm with ultrasound

## 2012-03-20 NOTE — Progress Notes (Signed)
MEDICATION RELATED CONSULT NOTE - INITIAL   Pharmacy Consult for Phenytoin PO Loading dose Indication: Seizures, No IV access  Allergies  Allergen Reactions  . Fish Allergy Anaphylaxis and Rash  . Depakote (Divalproex Sodium) Swelling  . Morphine And Related Hives    Patient Measurements: Height: 5\' 7"  (170.2 cm) Weight: 150 lb (68.04 kg) IBW/kg (Calculated) : 66.1   Vital Signs: Temp: 98.5 F (36.9 C) (11/15 1025) Temp src: Oral (11/15 1025) BP: 119/74 mmHg (11/15 1548) Pulse Rate: 69  (11/15 1548) Intake/Output from previous day:   Intake/Output from this shift:    Labs:  [facility] 03/20/12 1055 03/18/12 0804  WBC 4.3 4.2  HGB 9.0* 10.9*  HCT 33.2* 34.8*  PLT 269 250  APTT -- --  CREATININE 0.87 0.87  LABCREA -- --  CREATININE 0.87 0.87  CREAT24HRUR -- --  MG -- --  PHOS -- --  ALBUMIN -- 3.1*  PROT -- 6.9  ALBUMIN -- 3.1*  AST -- 24  ALT -- 21  ALKPHOS -- 109  BILITOT -- 0.1*  BILIDIR -- --  IBILI -- --   Estimated Creatinine Clearance: 117.1 ml/min (by C-G formula based on Cr of 0.87).   Microbiology: Recent Results (from the past 720 hour(s))  MRSA PCR SCREENING     Status: Normal   Collection Time   03/08/12  1:36 AM      Component Value Range Status Comment   MRSA by PCR NEGATIVE  NEGATIVE Final   MRSA PCR SCREENING     Status: Normal   Collection Time   03/08/12  3:24 PM      Component Value Range Status Comment   MRSA by PCR NEGATIVE  NEGATIVE Final     Medical History: Past Medical History  Diagnosis Date  . Seizure   . Protein C deficiency   . Protein C deficiency     Medications:   (Not in a hospital admission) Scheduled:    . [COMPLETED] diazepam      . [COMPLETED] diazepam  10 mg Rectal Once  . [COMPLETED]  HYDROmorphone (DILAUDID) injection  2 mg Intramuscular Once  . lacosamide  100 mg Oral BID  . levETIRAcetam  1,500 mg Oral BID  . [COMPLETED] LORazepam  2 mg Intramuscular Once   Followed by  . LORazepam  2 mg  Intramuscular Once  . [COMPLETED] oxyCODONE-acetaminophen  1 tablet Oral Once  . phenytoin  200 mg Oral BID  . phenytoin  400 mg Oral Q2H  . traZODone  50 mg Oral QHS,MR X 1  . [DISCONTINUED] LORazepam  2 mg Intramuscular Once  . [DISCONTINUED] phenytoin  200 mg Oral BID   Infusions:    . sodium chloride     PRN: acetaminophen, alum & mag hydroxide-simeth, magnesium hydroxide, nicotine polacrilex  Assessment:  31 YOM w/ hx sz d/o, had witnessed sz @ [facility], tx to [facility] ER for tx  Pharmacy asked to dose po phenytoin loading dose due to no IV access  PHT level is 2.9, albumin 3.1, corrected PL 4 which is subtx  Home dose of 200mg  BID ordered  Goal of Therapy:  PHT level 10-20 @ Css Appropriate LD of PHT  Plan:   PHT 400mg  PO q2h x 3 doses (~17mg /kg)  Will s/o and follow peripherally.   Would recommend level in 5-7 days   Gwen Her PharmD  [phone] 03/20/2012 4:12 PM

## 2012-03-20 NOTE — ED Notes (Signed)
IV team unable to get IV access in lower extremeties also.

## 2012-03-20 NOTE — ED Provider Notes (Signed)
History     CSN: [id number]  Arrival date & time 03/20/12  1018   First MD Initiated Contact with Patient 03/20/12 1034      Chief Complaint  Patient presents with  . seizure activity     (Consider location/radiation/quality/duration/timing/severity/associated sxs/prior treatment) HPI Comments: [name] [name] is a 31 y.o. Male who was at the behavioral health hospital. This morning, when he had a seizure. He reportedly had at least 4 seizures. This morning, short duration, they were treated with Versed and Ativan. He was transferred here by EMS. He came to this emergency department 2 days ago for depression and was subsequently stabilized, and transferred to the behavioral health hospital. About 2 weeks ago he was hospitalized with a right arm DVT, and is being treated with Xarelto. He takes tramadol for pain. He denies other recent illnesses. He missed a couple doses of Dilantin while he was being treated in the last 2 days.    The history is provided by the patient.    Past Medical History  Diagnosis Date  . Seizure   . Protein C deficiency   . Protein C deficiency     Past Surgical History  Procedure Date  . Insertion of vena cava filter     History reviewed. No pertinent family history.  History  Substance Use Topics  . Smoking status: Current Every Day Smoker -- 0.2 packs/day for 5 years    Types: Cigarettes  . Smokeless tobacco: Never Used  . Alcohol Use: 3.6 oz/week    6 Cans of beer per week      Review of Systems  All other systems reviewed and are negative.    Allergies  Fish allergy; Depakote; and Morphine and related  Home Medications   Current Outpatient Rx  Name  Route  Sig  Dispense  Refill  . LACOSAMIDE 100 MG PO TABS   Oral   Take 1 tablet (100 mg total) by mouth 2 (two) times daily.   60 tablet   2   . LEVETIRACETAM 750 MG PO TABS   Oral   Take 2 tablets (1,500 mg total) by mouth 2 (two) times daily.   120 tablet   3   .  PHENYTOIN SODIUM EXTENDED 100 MG PO CAPS   Oral   Take 2 capsules (200 mg total) by mouth 2 (two) times daily.   60 capsule   2   . RIVAROXABAN 15 MG PO TABS   Oral   Take 1 tablet (15 mg total) by mouth 2 (two) times daily. Take 15 mg po daily for 20 days then  Continue to take 20 mg po daily   30 tablet   3   . SULFAMETHOXAZOLE-TMP DS 800-160 MG PO TABS   Oral   Take 1 tablet by mouth every 12 (twelve) hours.   14 tablet   0   . THIAMINE HCL 100 MG PO TABS   Oral   Take 1 tablet (100 mg total) by mouth daily.   30 tablet   0   . TRAMADOL HCL 50 MG PO TABS   Oral   Take 1 tablet (50 mg total) by mouth every 6 (six) hours as needed.   30 tablet   0     BP 119/74  Pulse 78  Temp 98.5 F (36.9 C) (Oral)  Resp 24  Ht 5\' 7"  (1.702 m)  Wt 150 lb (68.04 kg)  BMI 23.49 kg/m2  SpO2 100%  Physical Exam  Nursing  note and vitals reviewed. Constitutional: He is oriented to person, place, and time. He appears well-developed and well-nourished.  HENT:  Head: Normocephalic and atraumatic.  Right Ear: External ear normal.  Left Ear: External ear normal.  Eyes: Conjunctivae normal and EOM are normal. Pupils are equal, round, and reactive to light.  Neck: Normal range of motion and phonation normal. Neck supple.  Cardiovascular: Normal rate, regular rhythm, normal heart sounds and intact distal pulses.   Pulmonary/Chest: Effort normal and breath sounds normal. He exhibits no bony tenderness.  Abdominal: Soft. Normal appearance. There is no tenderness.  Musculoskeletal: Normal range of motion.  Neurological: He is alert and oriented to person, place, and time. He has normal strength. No cranial nerve deficit or sensory deficit. He exhibits normal muscle tone. Coordination normal.  Skin: Skin is warm, dry and intact.  Psychiatric: He has a normal mood and affect. His behavior is normal. Judgment and thought content normal.    ED Course  Procedures (including critical care  time)   Patient had a seizure in the ED, witnessed by me, 1 minute tonic-clonic activity, with a 3 minute post ictal state. He was treated with rectal Valium 10 mg  He had IV access difficulty. Numerous peripheral sticks have been attempted. I attempted ultrasound stick in left upper arm without success.   I attempted to place a right subclavian vein catheter, and was unsuccessful. 2 passes attempted. No bleeding postoperatively, and no change in respiratory complaints.  Due to numerous attempts at placing IV catheters; I elected to start oral antiepileptic medications. His usual medicines are ordered with anticipated Dilantin loading orally. He'll need to be medically admitted for stabilization prior to returning to the [facility].   CRITICAL CARE Performed by: Flint Melter   Total critical care time:45 minutes  Critical care time was exclusive of separately billable procedures and treating other patients.  Critical care was necessary to treat or prevent imminent or life-threatening deterioration.  Critical care was time spent personally by me on the following activities: development of treatment plan with patient and/or surrogate as well as nursing, discussions with consultants, evaluation of patient's response to treatment, examination of patient, obtaining history from patient or surrogate, ordering and performing treatments and interventions, ordering and review of laboratory studies, ordering and review of radiographic studies, pulse oximetry and re-evaluation of patient's condition.     Labs Reviewed  CBC WITH DIFFERENTIAL - Abnormal; Notable for the following:    RBC 4.14 (*)     Hemoglobin 9.0 (*)     HCT 33.2 (*)     MCH 21.7 (*)     MCHC 27.1 (*)     All other components within normal limits  PHENYTOIN LEVEL, TOTAL - Abnormal; Notable for the following:    Phenytoin Lvl 2.9 (*)     All other components within normal limits  GLUCOSE, CAPILLARY -  Abnormal; Notable for the following:    Glucose-Capillary 107 (*)     All other components within normal limits  BASIC METABOLIC PANEL   No results found.   1. Seizure secondary to subtherapeutic anticonvulsant medication   2. Depression   3. DVT (deep venous thrombosis)   4. Drug-seeking behavior       MDM  Sz due to low drug level (s). He will need to be observed to ensure stabilization. He has very poor vascular access possibilities. He will need further psychiatric care.          Charnette Younkin L  Effie Shy, MD 03/20/12 1929

## 2012-03-20 NOTE — Progress Notes (Signed)
[name] Adult Inpatient Family/Significant Other Suicide Prevention Education  Suicide Prevention Education: Patient having medical problems, DC to [name] ED for continued treatment via EMS.  Will address with patient and family if returning to [name].  Patient Discharged to Other Healthcare Facility:  Suicide Prevention Education Not Provided: {PT. DISCHARGED TO OTHER HEALTHCARE FACILITY:SUICIDE PREVENTION EDUCATION NOT PROVIDED ([facility]):  The patient is discharging to another healthcare facility for continuation of treatment.  The patient's medical information, including suicide ideations and risk factors, are a part of the medical information shared with the receiving healthcare facility.  [name] [name], [name] [name] 03/20/2012, 2:01 PM

## 2012-03-20 NOTE — Progress Notes (Signed)
ANTICOAGULATION CONSULT NOTE - Initial Consult  Pharmacy Consult for Lovenox Indication: Hx DVT  Allergies  Allergen Reactions  . Fish Allergy Anaphylaxis and Rash  . Depakote (Divalproex Sodium) Swelling  . Morphine And Related Hives    Patient Measurements: Height: 5\' 7"  (170.2 cm) Weight: 150 lb (68.04 kg) IBW/kg (Calculated) : 66.1    Vital Signs: Temp: 98.5 F (36.9 C) (11/15 1025) Temp src: Oral (11/15 1025) BP: 119/74 mmHg (11/15 1548) Pulse Rate: 78  (11/15 1700)  Labs:  [facility] 03/20/12 1055 03/18/12 0804  HGB 9.0* 10.9*  HCT 33.2* 34.8*  PLT 269 250  APTT -- --  LABPROT -- --  INR -- --  HEPARINUNFRC -- --  CREATININE 0.87 0.87  CKTOTAL -- --  CKMB -- --  TROPONINI -- --    Estimated Creatinine Clearance: 117.1 ml/min (by C-G formula based on Cr of 0.87).   Medical History: Past Medical History  Diagnosis Date  . Seizure   . Protein C deficiency   . Protein C deficiency     Medications:   (Not in a hospital admission)  Assessment:  37 YOM w/ hx DVT - recently dx at [facility] (was @ [facility] 11/2-11/11 for sz). Back to [facility] ER 11/13 w/SI so tx to [facility] 11/14 but had to be tx to [facility] ER d/t sz.  Pt was initially tx @ [facility] w/ Coumadin/Lovenox for DVT but changed to Xarelto d/t refusing lab draws  Pt on Phenytoin for sz and this decreases effectiveness of Xarelto so on call MD notified and he has d/c Xarelto and asked Rx to dose Lovenox for VTE and has deferred long term anti-coag plan to attending MD  RN reports that pt is not currently refusing labs but is a difficult stick, no IV access.   Last Xarelto dose given 11/14 @ 1045   No bleeding reported. CBC ok  Scr wnl, CrCl >185ml/min  Goal of Therapy:  Anti-Xa level 0.6-1.2 units/ml 4hrs after LMWH dose given Monitor platelets by anticoagulation protocol: Yes   Plan:   Lovenox 70mg  sq q12h  Follow labs  Adjust dose as necessary  F/U long term plan for anticoag.  Gwen Her 03/20/2012,8:55 PM

## 2012-03-20 NOTE — ED Notes (Signed)
CBG post seizure activity is 107 on ED Glucometer

## 2012-03-20 NOTE — ED Notes (Signed)
Sleeping at present, Psych Tech at bedside

## 2012-03-20 NOTE — ED Notes (Signed)
To ED via [facility] from [facility], after having 5 seizures- 4 of which were witnessed. Psych tech who came with patient states seizures were full body, with drooling, On arrival awake- groggy, received VERSED 5mg  IM per EMS while having seizure on stretcher. Also received Ativan 4mg  IM from [employer].

## 2012-03-20 NOTE — H&P (Signed)
[facility] History and Physical  [name] [name] ZOX:[id number] DOB: 1981/03/30 DOA: 03/19/2012  Referring physician: er PCP: [name] Oats, MD  Specialists: neurology/psychiatry  Chief Complaint: seizures/head ache  HPI: [name] [name] is a 31 y.o. male  who was a patient at [facility]. Patient was a patient at behavioral health for suicidal ideations.  According to patient he was not given his seizure medicine while he was admitted at [facility]. Patient thinks the last time he had his seizure medicine was 3 days ago. Patient was recently hospitalized at [facility] for seizures. He has hard to control seizures. During the hospitalization patient also developed a blood clot.  In the ER patient was noted to by mouth Dilantin as he had no IV access and there is no way to gain IV access. Central line was attempted by the ER doctor and was unsuccessful.  Patient has a history of alcohol abuse as well.  C/o headache- says he get these after all his seizures  Review of Systems: all systems reviewed, negative unless stated above  Past Medical History  Diagnosis Date  . Seizure   . Protein C deficiency   . Protein C deficiency    Past Surgical History  Procedure Date  . Insertion of vena cava filter    Social History:  reports that he has been smoking Cigarettes.  He has a 1.25 pack-year smoking history. He has never used smokeless tobacco. He reports that he drinks about 3.6 ounces of alcohol per week. He reports that he uses illicit drugs (Marijuana).   Allergies  Allergen Reactions  . Fish Allergy Anaphylaxis and Rash  . Depakote (Divalproex Sodium) Swelling  . Morphine And Related Hives    Family Hx; +HTN  Prior to Admission medications   Medication Sig Start Date End Date Taking? Authorizing Provider  lacosamide 100 MG TABS Take 1 tablet (100 mg total) by mouth 2 (two) times daily. 03/16/12  Yes Meredeth Ide, MD  levETIRAcetam (KEPPRA) 750 MG tablet Take 2  tablets (1,500 mg total) by mouth 2 (two) times daily. 03/16/12  Yes Meredeth Ide, MD  phenytoin (DILANTIN) 100 MG ER capsule Take 2 capsules (200 mg total) by mouth 2 (two) times daily. 03/16/12  Yes Meredeth Ide, MD  Rivaroxaban (XARELTO) 15 MG TABS tablet Take 1 tablet (15 mg total) by mouth 2 (two) times daily. Take 15 mg po daily for 20 days then  Continue to take 20 mg po daily 03/16/12  Yes Meredeth Ide, MD  sulfamethoxazole-trimethoprim (BACTRIM DS) 800-160 MG per tablet Take 1 tablet by mouth every 12 (twelve) hours. 03/16/12  Yes Meredeth Ide, MD  thiamine 100 MG tablet Take 1 tablet (100 mg total) by mouth daily. 03/16/12  Yes Meredeth Ide, MD  traMADol (ULTRAM) 50 MG tablet Take 1 tablet (50 mg total) by mouth every 6 (six) hours as needed. 03/16/12  Yes Meredeth Ide, MD   Physical Exam: Filed Vitals:   03/20/12 1027 03/20/12 1310 03/20/12 1320 03/20/12 1548  BP:  102/63 121/72 119/74  Pulse:  88 110 69  Temp:      TempSrc:      Resp:  20  16  Height:      Weight:      SpO2: 98% 98% 100% 100%     General:  A+Ox3, NAD  Eyes: wnl  ENT: wnl  Neck: supple  Cardiovascular: rrr  Respiratory: clear anterior  Abdomen: +BS, soft, NT/ND  Skin: tatooes  Musculoskeletal: moves all 4 extremitites  Psychiatric: no active plan for suicide now  Neurologic: CN 2-12 grossly intact  Labs on Admission:  Basic Metabolic Panel:  Lab 03/20/12 0981 03/18/12 0804  NA 138 139  K 4.1 4.3  CL 108 108  CO2 20 22  GLUCOSE 96 107*  BUN 13 14  CREATININE 0.87 0.87  CALCIUM 9.0 8.8  MG -- --  PHOS -- --   Liver Function Tests:  Lab 03/18/12 0804  AST 24  ALT 21  ALKPHOS 109  BILITOT 0.1*  PROT 6.9  ALBUMIN 3.1*   No results found for this basename: LIPASE:5,AMYLASE:5 in the last 168 hours No results found for this basename: AMMONIA:5 in the last 168 hours CBC:  Lab 03/20/12 1055 03/18/12 0804  WBC 4.3 4.2  NEUTROABS 2.8 2.4  HGB 9.0* 10.9*  HCT 33.2* 34.8*    MCV 80.2 79.5  PLT 269 250   Cardiac Enzymes: No results found for this basename: CKTOTAL:5,CKMB:5,CKMBINDEX:5,TROPONINI:5 in the last 168 hours  BNP (last 3 results) No results found for this basename: PROBNP:3 in the last 8760 hours CBG:  Lab 03/20/12 1337  GLUCAP 107*    Radiological Exams on Admission: No results found.    Assessment/Plan Principal Problem:  *Seizure disorder Active Problems:  EtOH dependence  Drug-seeking behavior  Pain  DVT (deep venous thrombosis)   1. seizure d/o- load with dilantin, resume other anti seizure medications that patient has not had in 2 days, no IV access so will need to closely monitor, neurology consulted 2. Suicide ideations- psych consult, sitter until d/c'd by psych 3. Drug seeking behavior- limit narcotics 4. Pain- PRN medications, no IV as no access 5. DVT- continue xaralto 6. Headache- may need head CT if worsens or continues, per patient this is common after a seizure 7.   Neurology consulted in ER by ER physician at my request, I consulted psych dr for follow up from [facility]   Code Status: full Family Communication: patient at bedside Disposition Plan: [facility]?  Time spent: 70 min  [name] [name] [facility] Pager [phone]  If 7PM-7AM, please contact night-coverage [email or web address] Password [email or web address] 03/20/2012, 5:02 PM

## 2012-03-20 NOTE — ED Notes (Signed)
Continues to c/o headache, with any movement , states light hurts eyes. Dr. Effie Shy aware of inability to start IV

## 2012-03-20 NOTE — ED Notes (Signed)
To ED via [facility] from [facility]- had 5 seizures this am, back to back, full body, drooling, has hx "clotting disorder" and depression

## 2012-03-20 NOTE — ED Provider Notes (Signed)
Discussed with Dr Roseanne Reno who will consult on pt.  [name] Bucco, MD 03/20/12 [phone]

## 2012-03-20 NOTE — Progress Notes (Addendum)
Patient found on the floor after being  called by the mental health tech who found the patient; patient was in the bathroom on the floor and he had an unwitnessed fall and seizure; patient head was supported and laid on his side; vital signs were  124/68 and pulse was 102, and oxygen was 100% on room air;  Patient received 2 mg of ativan at 0945 hrs and another 2 mg of Ativan at 0951 hrs; five seizure episodes were witnessed but the time of the fourth seizure was 0949  And vitals at oxygen 99% and pulse was 85; and EMS arrived at 0955 hrs; patient had his fifth seizure at 0958 hrs; EMS administered Verses at 1000 hrs; patient has a history of seizures and is on dilantin and keppra; EMS left with the patient at 1005 hrs  Attempt was made to contact the family of the patient by another nurse and contact was not able to be made at this time

## 2012-03-21 DIAGNOSIS — G40401 Other generalized epilepsy and epileptic syndromes, not intractable, with status epilepticus: Principal | ICD-10-CM

## 2012-03-21 DIAGNOSIS — F329 Major depressive disorder, single episode, unspecified: Secondary | ICD-10-CM

## 2012-03-21 MED ORDER — MORPHINE SULFATE 30 MG PO TABS
60.0000 mg | ORAL_TABLET | ORAL | Status: DC | PRN
  Administered 2012-03-21 – 2012-03-23 (×14): 60 mg via ORAL
  Filled 2012-03-21 (×8): qty 2
  Filled 2012-03-21: qty 1
  Filled 2012-03-21 (×7): qty 2

## 2012-03-21 MED ORDER — DIPHENHYDRAMINE HCL 25 MG PO CAPS: 25.0000 mg | ORAL_CAPSULE | Freq: Three times a day (TID) | ORAL | Status: DC | PRN

## 2012-03-21 MED ORDER — LACOSAMIDE 100 MG PO TABS: 100.0000 mg | ORAL_TABLET | Freq: Two times a day (BID) | ORAL | Status: DC

## 2012-03-21 MED ORDER — MORPHINE SULFATE 30 MG PO TABS
30.0000 mg | ORAL_TABLET | ORAL | Status: DC | PRN
  Administered 2012-03-21: 30 mg via ORAL
  Filled 2012-03-21: qty 1

## 2012-03-21 MED ORDER — OXYCODONE HCL 5 MG PO TABS: 5.0000 mg | ORAL_TABLET | ORAL | Status: DC | PRN

## 2012-03-21 MED ORDER — LACOSAMIDE 50 MG PO TABS: 200.0000 mg | ORAL_TABLET | Freq: Two times a day (BID) | ORAL | Status: DC

## 2012-03-21 MED ORDER — LORAZEPAM 1 MG PO TABS: 4.0000 mg | ORAL_TABLET | ORAL | Status: DC | PRN

## 2012-03-21 MED ORDER — LACOSAMIDE 50 MG PO TABS
100.0000 mg | ORAL_TABLET | ORAL | Status: AC
  Administered 2012-03-21: 100 mg via ORAL
  Filled 2012-03-21: qty 1

## 2012-03-21 MED ORDER — LORAZEPAM 2 MG/ML IJ SOLN
INTRAMUSCULAR | Status: AC
  Filled 2012-03-21: qty 2

## 2012-03-21 MED ORDER — LORAZEPAM 2 MG/ML IJ SOLN
3.0000 mg | Freq: Once | INTRAMUSCULAR | Status: AC
  Administered 2012-03-21: 3 mg via INTRAMUSCULAR

## 2012-03-21 MED ORDER — MORPHINE SULFATE 15 MG PO TABS: 15.0000 mg | ORAL_TABLET | ORAL | Status: DC | PRN

## 2012-03-21 MED ORDER — LACOSAMIDE 50 MG PO TABS
200.0000 mg | ORAL_TABLET | Freq: Two times a day (BID) | ORAL | Status: DC
  Administered 2012-03-21 – 2012-03-23 (×4): 200 mg via ORAL
  Filled 2012-03-21: qty 1
  Filled 2012-03-21: qty 4
  Filled 2012-03-21 (×2): qty 2
  Filled 2012-03-21: qty 4

## 2012-03-21 MED ORDER — RIVAROXABAN 15 MG PO TABS
15.0000 mg | ORAL_TABLET | Freq: Two times a day (BID) | ORAL | Status: DC
  Administered 2012-03-21 – 2012-03-23 (×5): 15 mg via ORAL
  Filled 2012-03-21 (×7): qty 1

## 2012-03-21 MED ORDER — LORAZEPAM 2 MG/ML IJ SOLN: 4.0000 mg | INTRAMUSCULAR | Status: DC | PRN

## 2012-03-21 NOTE — Discharge Summary (Addendum)
Physician Discharge Summary  [name] [name] XBJ:[id number] DOB: 1981-05-01 DOA: 03/20/2012  PCP: Sheila Oats, MD  Admit date: 03/20/2012 Discharge date: 03/22/2012  Time spent: 30 minutes  Recommendations for Outpatient Follow-up:  1. PCP after d/c 2 weeks  Discharge Diagnoses:  Active Problems:  Status epilepticus, generalized convulsive  Seizure disorder  EtOH dependence  DVT (deep venous thrombosis)   Discharge Condition: stable  Diet recommendation: regular  Filed Weights   03/21/12 1300  Weight: 72.213 kg (159 lb 3.2 oz)    History of present illness:  31 y.o. male  who was a patient at behavioral health hospital. Patient was a patient at behavioral health for suicidal ideations. According to patient he was not given his seizure medicine while he was admitted at [facility]. Patient thinks the last time he had his seizure medicine was 3 days ago. Patient was recently hospitalized at [facility] for seizures. He has hard to control seizures. During the hospitalization patient also developed a blood clot.  In the ER patient was noted to by mouth Dilantin as he had no IV access and there is no way to gain IV access. Central line was attempted by the ER doctor and was unsuccessful.  Patient has a history of alcohol abuse as well.  C/o headache- says he get these after all his seizures  Review of Systems: all systems reviewed, negative unless stated above   Hospital Course:  Status epilepticus, generalized convulsive (03/07/2012) - load with dilantin, resume other anti seizure medications, patient one episode of seizure after this. His vipmat dose was increase. Consult Neuro who also recommended to increase Keppra. The cause of his seizure was most likely he did not get his medications. - continue medication as an outpatient.  - d/c dilaudid can lower seizure threshold.  - pain controlled on  Morphine.   Suicide ideations  - psych consult, patient transfer to [facility].    -medically stable for transfer.   DVT  - continue xarelto. Continue xarelto 15mg  PO BID for 21 days days then 20 mg daily.  - has been tried on coumadin in the past and refuses blood drawn. Not a candidate.  Headache: On admission, improved by the next day with tylenol.   Procedures:  none (i.e. Studies not automatically included, echos, thoracentesis, etc; not x-rays)  Consultations:  Neurology  Psyquiatry  Discharge Exam: Filed Vitals:   03/21/12 1200 03/21/12 1300 03/21/12 2141 03/22/12 0606  BP: 135/89  162/95 129/83  Pulse: 69  75 84  Temp:   98 F (36.7 C) 98 F (36.7 C)  TempSrc:   Oral Oral  Resp: 16  16 20   Height:  5\' 7"  (1.702 m)    Weight:  72.213 kg (159 lb 3.2 oz)    SpO2: 99%  100% 100%    General: A&Ox3 Cardiovascular: RRR Respiratory: good air movement  Discharge Instructions      Discharge Orders    Future Orders Please Complete By Expires   Diet - low sodium heart healthy      Increase activity slowly          Medication List     As of 03/22/2012  8:36 AM    TAKE these medications         Lacosamide 100 MG Tabs   Take 1 tablet (100 mg total) by mouth 2 (two) times daily.      Lacosamide 100 MG Tabs   Take 1 tablet (100 mg total) by mouth 2 (two) times daily.  levETIRAcetam 750 MG tablet   Commonly known as: KEPPRA   Take 2 tablets (1,500 mg total) by mouth 2 (two) times daily.      phenytoin 100 MG ER capsule   Commonly known as: DILANTIN   Take 2 capsules (200 mg total) by mouth 2 (two) times daily.      Rivaroxaban 15 MG Tabs tablet   Commonly known as: XARELTO   Take 1 tablet (15 mg total) by mouth 2 (two) times daily. Take 15 mg po daily for 20 days then   Continue to take 20 mg po daily      sulfamethoxazole-trimethoprim 800-160 MG per tablet   Commonly known as: BACTRIM DS   Take 1 tablet by mouth every 12 (twelve) hours.      thiamine 100 MG tablet   Take 1 tablet (100 mg total) by mouth daily.       traMADol 50 MG tablet   Commonly known as: ULTRAM   Take 1 tablet (50 mg total) by mouth every 6 (six) hours as needed.           The results of significant diagnostics from this hospitalization (including imaging, microbiology, ancillary and laboratory) are listed below for reference.    Significant Diagnostic Studies: Ct Head Wo Contrast  03/07/2012  *RADIOLOGY REPORT*  Clinical Data: Seizure  CT HEAD WITHOUT CONTRAST  Technique:  Contiguous axial images were obtained from the base of the skull through the vertex without contrast.  Comparison: None.  Findings:  Ventricles are normal in size and configuration. There is a 3.2 x 1.1 cm arachnoid cyst in the anterior left temporal region.  There is no other mass.  There is no  hemorrhage, extra- axial fluid collection, or midline shift. Gray-white compartments are normal.  Bony calvarium appears intact.  The mastoid air cells are clear.  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  Study otherwise unremarkable.   Original Report Authenticated By: Bretta Bang, M.D.    Mr [name] Wo Contrast  03/10/2012  *RADIOLOGY REPORT*  Clinical Data: Baseline seizure disorder with repeated breakthrough seizures.  Possible superimposed viral syndrome.  EtOH dependence with drug seeking behavior.  MRI HEAD WITHOUT AND WITH CONTRAST  Technique:  Multiplanar, multiecho pulse sequences of the brain and surrounding structures were obtained according to standard protocol without and with intravenous contrast  Contrast: 15mL MULTIHANCE GADOBENATE DIMEGLUMINE 529 MG/ML IV SOLN  Comparison: CT head 03/15/2012  Findings: The patient had difficulty remaining motionless for the study.  Images are suboptimal.  Small or subtle lesions could be overlooked.  There is no evidence for acute infarction, intracranial hemorrhage, mass lesion, hydrocephalus, or extra-axial fluid. There is no atrophy or white matter disease.  There  are no foci of chronic hemorrhage.  Following administration of contrast, no abnormal enhancement of the brain or meninges.  Normal pituitary and cerebellar tonsils. Negative skull base and calvarium.  Clear sinuses and mastoids.  Thin section coronal images through the temporal lobe are degraded by motion.  There is no visible temporal lobe mass or inflammatory process.  There is apparent discrepancy between the CT images and MR.  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  I believe this CT appearance is a fortuitous  confluence of partial volume averaging of the left sylvian fissure simulating an extra-axial CSF collection.   Multiple series in all three orthogonal planes fail to demonstrate an arachnoid cyst on MR.  IMPRESSION: Motion degraded  examination as described.  No acute or focal intracranial abnormality.  There is no visible arachnoid cyst on today's study; prior CT appearance was related to partial volume averaging of sylvian fissure.   Original Report Authenticated By: Davonna Belling, M.D.    Ir Fluoro Rm 30-60 Min  03/14/2012  *RADIOLOGY REPORT*  Clinical Data: Need for IV access.  History of multiple central lines and currently with right upper extremity brachial vein DVT.  IR FLOURO RM 0-60 MIN  The patient was brought down to establish venous access. Initially, ultrasound was performed of both right and left neck. The right neck was prepped with chlorhexidine.  Local anesthesia was provided with 1% lidocaine.  Under ultrasound guidance, attempt was made to cannulate a right neck collateral vein.  A guidewire was advanced through the needle.  The left upper arm was then prepped with chlorhexidine.  Ultrasound guided needle access was attempted of the left brachial vein above the elbow.  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  Collateral veins are present.  Despite being able to puncture a collateral vein in the right neck, a guide wire would not advance  sufficiently to allow central venous catheter placement.  A patent brachial vein was visualized in the left upper arm. Initial needle passage did irritate the brachial nerve, resulting in radicular pain extending into the hand.  When the vein was reattempted to be accessed from a slightly different approach, the patient refused further attempt at venous access and the procedure was aborted.  IMPRESSION: Inability to establish central or peripheral venous access as above.   Original Report Authenticated By: Irish Lack, M.D.    Dg Chest Port 1 View  03/09/2012  *RADIOLOGY REPORT*  Clinical Data: Atelectasis  PORTABLE CHEST - 1 VIEW  Comparison: None.  Findings: Cardiomediastinal silhouette is unremarkable.  No acute infiltrate or pleural effusion.  No pulmonary edema.  The left costophrenic angle is not included on the film.  IMPRESSION: No active disease.   Original Report Authenticated By: Natasha Mead, M.D.     Microbiology: No results found for this or any previous visit (from the past 240 hour(s)).   Labs: Basic Metabolic Panel:  Lab 03/20/12 4782 03/18/12 0804  NA 138 139  K 4.1 4.3  CL 108 108  CO2 20 22  GLUCOSE 96 107*  BUN 13 14  CREATININE 0.87 0.87  CALCIUM 9.0 8.8  MG -- --  PHOS -- --   Liver Function Tests:  Lab 03/18/12 0804  AST 24  ALT 21  ALKPHOS 109  BILITOT 0.1*  PROT 6.9  ALBUMIN 3.1*   No results found for this basename: LIPASE:5,AMYLASE:5 in the last 168 hours No results found for this basename: AMMONIA:5 in the last 168 hours CBC:  Lab 03/20/12 1055 03/18/12 0804  WBC 4.3 4.2  NEUTROABS 2.8 2.4  HGB 9.0* 10.9*  HCT 33.2* 34.8*  MCV 80.2 79.5  PLT 269 250   Cardiac Enzymes: No results found for this basename: CKTOTAL:5,CKMB:5,CKMBINDEX:5,TROPONINI:5 in the last 168 hours BNP: BNP (last 3 results) No results found for this basename: PROBNP:3 in the last 8760 hours CBG:  Lab 03/20/12 1337  GLUCAP 107*       Signed:  FELIZ ORTIZ,  Hesston Hitchens  [facility] 03/22/2012, 8:36 AM

## 2012-03-21 NOTE — Significant Event (Signed)
Rapid Response Event Note  Overview: Time Called: 1035 Arrival Time: 1038 Event Type: Neurologic;Other (Comment) (seizure)  Initial Focused Assessment: Pt just had a seizure,  Appears post ictal.   Interventions: Placed on monitor shows SR.  IV team called for access, very diff stick.  Pt was given 3mg  of Ativan  IM by nurse on 5th floor.     Event Summary:  11:15  BP 118/72 HR 72 Resp 8.  Pt will arouse c/o headache and feeling bad, also telling IV team about previous difficulty with IV sticks.  Dr Robb Matar called, will cont to monitor pt on floor unless he has another seizure will transfer to unit.   at      at          [place], Zannie Kehr

## 2012-03-21 NOTE — Progress Notes (Signed)
CSW was notified that Pt was stating that he "was going to leave" and the nursing staff were asking if an IVC needed to be ordered.   CSW requested time to speak with the Pt regarding his wanting "to leave" and see if Pt would be willing to stay voluntarily.   CSW  (with sitter remaining in the room) met with the Pt to discuss his statement about leaving the hospital.   Pt stated that he did say he wanted to leave because he "was not able to get what he needed for pain" and that the [facility] had "caused him to have a seizure" while on their unit.   Pt stated that he "repeatedly asked for his seizure medication" and was told "that they would need an order before they could give it to him". Pt stated that he was concerned about having a seizure and just "needed them to give him the medicine". Pt also stated that he brought his bottles in so that    Pt stated that he knows he "did not handle it well". Pt has to agree remain in the hospital for care and stated that he "just got frustrated and every time he thought about the [facility] not giving his medication it was upsetting him".  CSW validated that Pt has every right to be frustrated, but that he needs to calmly convey his concerns in an appropriate manner in order to get the care he is seeking.   CSW then spoke with the Dr. Ernestine Mcmurray about the IVC and subsequent conversations CSW had with Pt regarding remaining in the hospital for tx and that Pt will be [facility] once he was medically stable. MD was able to come and speak with the Pt about his concerns and answer medical questions that were unclear earlier today.   MD, Pt, sitter, and CSW met with the Pt to discuss concerns and remaining in the hospital. CSW reiterated to the Pt that he would need to remain calm and convey his concerns in a respectful manner, to which the Pt agreed. Pt was able to effectively express his concerns and MD explained why certain medications could not be used.    Pt is to continue to receive treatment and when medically stable to transport back over to [facility] to finish his inpt mental health tx.   CSW will follow Pt for d/c planning and to address any other concerns by staff or Pt.   Leron Croak, LCSWA [employer] [phone]

## 2012-03-21 NOTE — Progress Notes (Signed)
Subjective: No new complaints. Patient had a witnessed generalized seizure early this afternoon. He was given Ativan and has had no further seizure activity. Currently on Vimpat 200 mg twice a day, Keppra 1500 mg twice a day and Dilantin 200 mg twice a day and seems to be tolerating these medications well.  Objective: Current vital signs: BP 135/89  Pulse 69  Temp 97.9 F (36.6 C) (Oral)  Resp 16  Ht 5\' 7"  (1.702 m)  Wt 72.213 kg (159 lb 3.2 oz)  BMI 24.93 kg/m2  SpO2 99%  Neurologic Exam: Alert and in no acute distress. He is well-oriented to time as well as place. His mental status is normal.   Medications:  Scheduled:   . folic acid  1 mg Oral Daily  . [COMPLETED] lacosamide  100 mg Oral STAT  . lacosamide  200 mg Oral BID  . levETIRAcetam  1,500 mg Oral BID  . [COMPLETED] LORazepam      . [COMPLETED] LORazepam  3 mg Intramuscular Once  . multivitamin with minerals  1 tablet Oral Daily  . phenytoin  200 mg Oral BID  . [COMPLETED] phenytoin  400 mg Oral Q2H  . Rivaroxaban  15 mg Oral BID  . sulfamethoxazole-trimethoprim  1 tablet Oral Q12H  . thiamine  100 mg Oral Daily   Or  . thiamine  100 mg Intravenous Daily  . [DISCONTINUED] enoxaparin (LOVENOX) injection  70 mg Subcutaneous Q12H  . [DISCONTINUED] enoxaparin (LOVENOX) injection  70 mg Subcutaneous Q12H  . [DISCONTINUED] lacosamide  100 mg Oral BID  . [DISCONTINUED] lacosamide  200 mg Oral BID  . [DISCONTINUED] rivaroxaban  20 mg Oral Daily  . 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 Patient had a breakthrough seizure today but did not go into status epilepticus.  I will consider increasing Keppra to 2000 mg twice a day for maintenance, with no changes in current Dilantin and Vimpat doses. For now, I am making no changes in his current management.  C.R. Roseanne Reno, MD [facility] [phone]  03/21/2012  6:14 PM

## 2012-03-21 NOTE — Progress Notes (Signed)
Late Entry: Pt began having tonic clonic seizure activity at 1020. Lasted until 1024am. Pt was unresponsive, eyes shifted to L, head shifted to L and stiff. Carotid pulse, Radial palpable. Teeth clinched, pt began drooling, making sounds, arms clinched to chest. Pt then became responsive post-ictal. Dr. Robb Matar notified, attempted to start IV with no success, IV team notified. Pt given IM ativan as ordered. He was able to verify name and dob, location. Speech slightly slurred, Eyes sluggish 3mm. Pulses remained strong. Eyes then rolled back to head at 1035. Pt repeated same seizure activity. From 1035-1038am. Pos-ictal. Pt was responsive after about . Answered questions appropriately and began complaining of headache. Pt had incontinent episodes during seizure activity. States "his body aches all over". VSS, oxygen sat, and pulses remain normal during all seizure activities. Rapid Response nurse was notified for extra support and monitoring at the bedside. Iv team successfully gained access after multiple attempts. Dr. Robb Matar gave new orders. Explained to pt what happened. He denies any auras prior to seizure activity. He verbalized understanding of current orders and monitoring.

## 2012-03-21 NOTE — Progress Notes (Signed)
[facility] PROGRESS NOTE  Assessment/Plan: Status epilepticus, generalized convulsive (03/07/2012) - load with dilantin, resume other anti seizure medications, patient one episode of seizure after this. His vipmat dose was increase. - neurology: recommended additional keppra and Dilantin.  - continue medication as an outpatient. - d/c dilaudid can lower seizure threshold. - start Morphine.  Suicide ideations  - psych consult, patient transfer to [facility].  -medically stable for transfer.   DVT  - continue xarelto. Continue xarelto 15mg  PO BID for 21 days days then 20 mg daily.  - has been tried on coumadin in the past and refuses blood drawn. Not a candidate.  Code Status: full Family Communication: none  Disposition Plan: [facility]   Consultants:  Psyq.  Neurology  Procedures:  one  Antibiotics:  none   HPI/Subjective: patient mildly somnolent from ativan. No complains  Objective: Filed Vitals:   03/21/12 0604 03/21/12 1028 03/21/12 1138 03/21/12 1200  BP: 121/78 141/82 130/81 135/89  Pulse: 68 76 68 69  Temp: 97.9 F (36.6 C)     TempSrc: Oral     Resp: 20 12 8 16   SpO2: 100% 100%  99%    Intake/Output Summary (Last 24 hours) at 03/21/12 1304 Last data filed at 03/21/12 0830  Gross per 24 hour  Intake    240 ml  Output      0 ml  Net    240 ml   There were no vitals filed for this visit.  Exam:  General: Alert, awake, oriented x3, in no acute distress.  HEENT: No bruits, no goiter.  Heart: Regular rate and rhythm, without murmurs, rubs, gallops.  Lungs: Good air movement, bilateral air movement.  Abdomen: Soft, nontender, nondistended, positive bowel sounds.  Neuro: Grossly intact, nonfocal.   Data Reviewed: Basic Metabolic Panel:  Lab 03/20/12 1610 03/18/12 0804  NA 138 139  K 4.1 4.3  CL 108 108  CO2 20 22  GLUCOSE 96 107*  BUN 13 14  CREATININE 0.87 0.87  CALCIUM 9.0 8.8  MG -- --  PHOS -- --   Liver Function Tests:  Lab  03/18/12 0804  AST 24  ALT 21  ALKPHOS 109  BILITOT 0.1*  PROT 6.9  ALBUMIN 3.1*   No results found for this basename: LIPASE:5,AMYLASE:5 in the last 168 hours No results found for this basename: AMMONIA:5 in the last 168 hours CBC:  Lab 03/20/12 1055 03/18/12 0804  WBC 4.3 4.2  NEUTROABS 2.8 2.4  HGB 9.0* 10.9*  HCT 33.2* 34.8*  MCV 80.2 79.5  PLT 269 250   Cardiac Enzymes: No results found for this basename: CKTOTAL:5,CKMB:5,CKMBINDEX:5,TROPONINI:5 in the last 168 hours BNP (last 3 results) No results found for this basename: PROBNP:3 in the last 8760 hours CBG:  Lab 03/20/12 1337  GLUCAP 107*    No results found for this or any previous visit (from the past 240 hour(s)).   Studies: No results found.  Scheduled Meds:   . folic acid  1 mg Oral Daily  . [COMPLETED] lacosamide  100 mg Oral STAT  . lacosamide  200 mg Oral BID  . levETIRAcetam  1,500 mg Oral BID  . [COMPLETED] LORazepam      . [COMPLETED] LORazepam  3 mg Intramuscular Once  . multivitamin with minerals  1 tablet Oral Daily  . phenytoin  200 mg Oral BID  . [COMPLETED] phenytoin  400 mg Oral Q2H  . Rivaroxaban  15 mg Oral BID  . sulfamethoxazole-trimethoprim  1 tablet Oral  Q12H  . thiamine  100 mg Oral Daily   Or  . thiamine  100 mg Intravenous Daily  . [DISCONTINUED] enoxaparin (LOVENOX) injection  70 mg Subcutaneous Q12H  . [DISCONTINUED] enoxaparin (LOVENOX) injection  70 mg Subcutaneous Q12H  . [DISCONTINUED] lacosamide  100 mg Oral BID  . [DISCONTINUED] lacosamide  200 mg Oral BID  . [DISCONTINUED] rivaroxaban  20 mg Oral Daily  . [DISCONTINUED] rivaroxaban  15 mg Oral BID   Continuous Infusions:    Marinda Elk  [facility] Pager [phone].  If 8PM-8AM, please contact night-coverage at [email or web address], password [facility] 03/21/2012, 1:04 PM  LOS: 1 day

## 2012-03-21 NOTE — Progress Notes (Signed)
Pt continues to do well. He is verbal, A&O x 4. Pt is eating lunch and tolerating well. States his pain has "eased off some". Watching tv, sitter at bedside. Will continue to closely monitor.

## 2012-03-21 NOTE — Progress Notes (Signed)
Pt currently watching tv and resting. States "he is going to take a nap". No further complications. Headache and body aches has began to resolve. VSS. Will continue to closely monitor.

## 2012-03-21 NOTE — Progress Notes (Signed)
Pt has become agrivated due to the medication changes. He prefers IV dilaudid, stating "that is the only pain medication that works for him". Attempted to explain to him that the MD changed medication bc it lowers his seizure threshold. Pt becomes extremely aggravated, stating "it's ok I am getting ready to leave, I do not have to stay here". Dr. Robb Matar called and SW has become involved regarding his needs and safety concerns. Pt agreed to take the po MSIR for pain relief, I explained to him to contact us for any itching or skin irritation as he does have an allergy to Morphine. He was calm and verbalized understanding.

## 2012-03-21 NOTE — Consult Note (Signed)
Reason for Consult: Seizures Referring Physician: Marlin Canary  CC: Seizures  History is obtained from: Patient  HPI: [name] [name] is a 31 y.o. male with a history of seizure disorder who is well-known to me from his recent hospital stay. During that time he had refractory repeated episodes of status epilepticus, but was controlled with 3 drug regimen including lacosamide, Dilantin, Keppra. He is discharged, but due to some suicidal ideation was sent to behavioral health. He states that while he was there he did not receive his seizure medicines. He had a breakthrough seizure today and has a low Dilantin level.  He was given a total of 800 mg oral Dilantin in the ER. He was also given a single dose of Keppra 1500 mg.  ROS: A 14 point ROS was performed and is negative except as noted in the HPI.  Past Medical History  Diagnosis Date  . Seizure   . Protein C deficiency   . Protein C deficiency     Family History: Mother, sister - epilepsuy  Social History: Tob: quit smoking one month ago.   Exam: Current vital signs: There were no vitals taken for this visit. Vital signs in last 24 hours: Temp:  [98.1 F (36.7 C)-98.5 F (36.9 C)] 98.5 F (36.9 C) (11/15 1025) Pulse Rate:  [61-110] 78  (11/15 1700) Resp:  [15-28] 24  (11/15 1700) BP: (101-146)/(56-83) 119/74 mmHg (11/15 1548) SpO2:  [98 %-100 %] 100 % (11/15 1700)  General: In bed, NAD CV: RRR HEENT: left posterior molar appears to have been injured Mental Status: Patient is awake, alert, oriented to person, place, month, year, and situation. Immediate and remote memory are intact. Patient is able to give a clear and coherent history. Able to spell world backwards without difficulty Cranial Nerves: II: Visual Fields are full. Pupils are equal, round, and reactive to light.  Discs are sharp. III,IV, VI: EOMI without ptosis or diploplia.  V: Facial sensation is symmetric to temperature VII: Facial movement is  symmetric.  VIII: hearing is intact to voice X: Uvula elevates symmetrically XI: Shoulder shrug is symmetric. XII: tongue is midline without atrophy or fasciculations.  Motor: Tone is normal. Bulk is normal. 5/5 strength was present in all four extremities.  Sensory: Sensation is symmetric to light touch and temperature in the arms and legs. Deep Tendon Reflexes: 2+ and symmetric in the biceps and patellae.  Plantars: Toes are downgoing bilaterally.  Cerebellar: FNF and HKS are intact bilaterally Gait: Patient has a stable casual gait.  I have reviewed labs in epic and the results pertinent to this consultation are: Dilantin - 2.9  Impression: 31 yo M with seizure disorder with breakthrough seizures in the setting of missing medications. At this time, I would recommend restarting his medications. He does not have IV access, but if he does have repeated seizures, would give IM ativan.   Also has molar injury.   Recommendations: 1) Keppra, additional dose now, then 1500mg  BID 2) Dilantin, additional dose now, then 200mg  BID 3) Vimpat 100mg  BID, first dose now.  4) If recurrent seizure activity lasting longer than 2-3 minutes, would give IM ativan given difficulty of stopping seizures during previous hospitalization.  5) May need dental consult for tooth injury, will defer to primary team.    Ritta Slot, MD [facility] [phone]  If 7pm- 7am, please page neurology on call at [phone].

## 2012-03-21 NOTE — Progress Notes (Signed)
Morphine ordered for patient who gets hives from Morphine. Alerted Dr. David Stall regarding allergy.  Recommended to give dilaudid as patient had tolerated it here in hospital, but MD wants to give morphine despite allergy. RN Britt Boozer) double checked with patient regarding allergic reaction to morphine. Patient reports itching and hives but no anaphylactic reaction. MD alerted RN to check for any reaction after morphine is given.  [name] [name] 03/21/2012

## 2012-03-22 DIAGNOSIS — F323 Major depressive disorder, single episode, severe with psychotic features: Secondary | ICD-10-CM

## 2012-03-22 DIAGNOSIS — I82409 Acute embolism and thrombosis of unspecified deep veins of unspecified lower extremity: Secondary | ICD-10-CM

## 2012-03-22 MED ORDER — LACOSAMIDE 200 MG PO TABS: 200.0000 mg | ORAL_TABLET | Freq: Two times a day (BID) | ORAL | Status: DC

## 2012-03-22 MED ORDER — FLUOXETINE HCL 20 MG PO CAPS
20.0000 mg | ORAL_CAPSULE | Freq: Every day | ORAL | Status: DC
  Administered 2012-03-22 – 2012-03-23 (×2): 20 mg via ORAL
  Filled 2012-03-22 (×2): qty 1

## 2012-03-22 NOTE — Progress Notes (Signed)
CSW had previously left a message with [facility] for notification of bed availability.   No bed was available for d/c to [facility] over the weekend.   Weekday CSW to follow up with possible bed on Monday.   Leron Croak, LCSWA [employer] [phone]
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
## 2012-03-22 NOTE — Progress Notes (Signed)
Clinical Social Work Department BRIEF PSYCHOSOCIAL ASSESSMENT 03/22/2012  Patient:  [name] [name],[name] [name]     Account Number:  [id number]     Admit date:  03/20/2012  Clinical Social Worker:  Leron Croak, CLINICAL SOCIAL WORKER  Date/Time:  03/22/2012 06:36 PM  Referred by:  Physician  Date Referred:  03/22/2012 Referred for  Behavioral Health Issues   Other Referral:   Interview type:  Patient Other interview type:   Sitter in the room    PSYCHOSOCIAL DATA Living Status:  ALONE Admitted from facility:   Level of care:   Primary support name:  None Primary support relationship to patient:   Degree of support available:   Pt has limited support systems at this time    CURRENT CONCERNS Current Concerns  Behavioral Health Issues   Other Concerns:    SOCIAL WORK ASSESSMENT / PLAN CSW met with the Pt on Saturday and completed an intervention to address behavioral concerns that were affecting [name] medical care. Please refer to CSW note on 11/17 for explanation. Post meeting with Pt, Pt was agreeable to remaining in the hospital to complete [facility] tx and return back over to [facility] to accomplish this. CSW addressed Pt and staff concerns for inproved Pt care. Weekday CSW will continue to follow Pt until he is transferred back over to [facility].   Assessment/plan status:  Psychosocial Support/Ongoing Assessment of Needs Other assessment/ plan:   Information/referral to community resources:   CSW offered contact information to Pt for ongoing psychosocial support while on the medical unit.    PATIENT'S/FAMILY'S RESPONSE TO PLAN OF CARE: Pt was appreciative for asssistance and apologized for any misunderstanding that was conveyed on his part.      Leron Croak, LCSWA [employer] [phone]

## 2012-03-23 ENCOUNTER — Inpatient Hospital Stay (HOSPITAL_COMMUNITY)
Admission: AD | Admit: 2012-03-23 | Discharge: 2012-03-26 | DRG: 885 | Disposition: A | Discharge status: Nursing Home (Includes ICF) | Payer: Federal, State, Local not specified - Other | Source: Ambulatory Visit | Attending: Internal Medicine | Admitting: Internal Medicine

## 2012-03-23 DIAGNOSIS — Z79899 Other long term (current) drug therapy: Secondary | ICD-10-CM

## 2012-03-23 DIAGNOSIS — F1020 Alcohol dependence, uncomplicated: Secondary | ICD-10-CM | POA: Diagnosis present

## 2012-03-23 DIAGNOSIS — F17200 Nicotine dependence, unspecified, uncomplicated: Secondary | ICD-10-CM | POA: Diagnosis present

## 2012-03-23 DIAGNOSIS — G40909 Epilepsy, unspecified, not intractable, without status epilepticus: Secondary | ICD-10-CM

## 2012-03-23 DIAGNOSIS — F329 Major depressive disorder, single episode, unspecified: Principal | ICD-10-CM | POA: Diagnosis present

## 2012-03-23 DIAGNOSIS — G40901 Epilepsy, unspecified, not intractable, with status epilepticus: Secondary | ICD-10-CM

## 2012-03-23 DIAGNOSIS — D6859 Other primary thrombophilia: Secondary | ICD-10-CM | POA: Diagnosis present

## 2012-03-23 DIAGNOSIS — F322 Major depressive disorder, single episode, severe without psychotic features: Secondary | ICD-10-CM

## 2012-03-23 DIAGNOSIS — R569 Unspecified convulsions: Secondary | ICD-10-CM | POA: Diagnosis present

## 2012-03-23 MED ORDER — ALUM & MAG HYDROXIDE-SIMETH 200-200-20 MG/5ML PO SUSP: 30.0000 mL | ORAL | Status: DC | PRN

## 2012-03-23 MED ORDER — LACOSAMIDE 200 MG PO TABS
200.0000 mg | ORAL_TABLET | Freq: Two times a day (BID) | ORAL | Status: DC
  Administered 2012-03-23 – 2012-03-24 (×2): 200 mg via ORAL
  Filled 2012-03-23: qty 1

## 2012-03-23 MED ORDER — MAGNESIUM HYDROXIDE 400 MG/5ML PO SUSP: 30.0000 mL | Freq: Every day | ORAL | Status: DC | PRN

## 2012-03-23 MED ORDER — SULFAMETHOXAZOLE-TMP DS 800-160 MG PO TABS
1.0000 | ORAL_TABLET | Freq: Two times a day (BID) | ORAL | Status: DC
  Administered 2012-03-23 – 2012-03-25 (×5): 1 via ORAL
  Filled 2012-03-23 (×10): qty 1

## 2012-03-23 MED ORDER — PHENYTOIN SODIUM EXTENDED 100 MG PO CAPS
200.0000 mg | ORAL_CAPSULE | Freq: Two times a day (BID) | ORAL | Status: DC
  Administered 2012-03-23 – 2012-03-24 (×2): 200 mg via ORAL
  Filled 2012-03-23: qty 1
  Filled 2012-03-23: qty 2
  Filled 2012-03-23: qty 1
  Filled 2012-03-23 (×5): qty 2

## 2012-03-23 MED ORDER — TRAMADOL HCL 50 MG PO TABS: 50.0000 mg | ORAL_TABLET | Freq: Four times a day (QID) | ORAL | Status: DC | PRN

## 2012-03-23 MED ORDER — NICOTINE POLACRILEX 2 MG MT GUM
2.0000 mg | CHEWING_GUM | OROMUCOSAL | Status: DC | PRN
  Administered 2012-03-25 (×2): 2 mg via ORAL

## 2012-03-23 MED ORDER — RIVAROXABAN 15 MG PO TABS
15.0000 mg | ORAL_TABLET | Freq: Two times a day (BID) | ORAL | Status: DC
  Administered 2012-03-23 – 2012-03-24 (×2): 15 mg via ORAL
  Filled 2012-03-23 (×7): qty 1

## 2012-03-23 MED ORDER — ACETAMINOPHEN 325 MG PO TABS
650.0000 mg | ORAL_TABLET | Freq: Four times a day (QID) | ORAL | Status: DC | PRN
  Administered 2012-03-25: 650 mg via ORAL
  Filled 2012-03-23: qty 2

## 2012-03-23 MED ORDER — LEVETIRACETAM 750 MG PO TABS
1500.0000 mg | ORAL_TABLET | Freq: Two times a day (BID) | ORAL | Status: DC
  Administered 2012-03-23 – 2012-03-24 (×2): 1500 mg via ORAL
  Filled 2012-03-23 (×7): qty 2

## 2012-03-23 NOTE — Progress Notes (Signed)
D: Patient in his room in bed on approach.  Patient angry because he states he had a seizure a day ago because the nurses made him miss two doses of his seizure medications.  Patient states he does not feel well after having a seizure a day ago.  Patient states he grinded his teeth during his seizure and it messed up one of his back teeth.  Patient states he is sore all over but refuses pain medication the his doctor has written for him.  Patient is requesting morphine.  Patient states he is here for depression.  Patient rates depression high.  Patient passive SI but verbally contracts for safety.  Patient denies HI and denies AVH. A: Staff to monitor Q 15 mins for safety.  Encouragement and support offered.  Scheduled medications administered per orders R: Patient remains safe on the unit.  Patient did not attend aa/na group tonight.  Patient not visible or interacting on the unit.  Patient taking administered medications.

## 2012-03-23 NOTE — Progress Notes (Signed)
Per Tanna Savoy at [facility], Pt has been accepted to Bed 306 bed 1.  Notified Pt and RN.  Pt signed [facility]'s consent.  Faxed signed consent.  Pt to be d/c'd.  Providence Crosby, LCSWA Clinical Social Work [phone]

## 2012-03-23 NOTE — Progress Notes (Signed)
Report was called  To [facility] by Ronaldo Miyamoto.  No changes noted since am assessment.
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
## 2012-03-23 NOTE — Progress Notes (Addendum)
(  D) Patient in bed with hand over eyes requesting Morphine for mouth pain which he relates to biting mouth during seizure prior to this admission. (A) Patient informed that he would not be receiving Morphine but had Ultram ordered for severe pain. (R) Patient became upset and stated "I wouldn't have come if I had known that." He declined a PRN dose of Ultram. Patient instructed to let nurse know if he changes mind. Passive SI, contracts for safety. [name] RN MS EdS 03/23/2012  5:25 PM

## 2012-03-23 NOTE — Progress Notes (Signed)
Spoke with Tanna Savoy at [facility]  [facility] stated that they do not have any beds, at the moment, but that they anticipate discharges later today.  Pt will have a bed as soon as they have a discharge.  CSW to continue to follow.  Providence Crosby, LCSWA Clinical Social Work [phone]

## 2012-03-23 NOTE — Progress Notes (Signed)
[facility] PROGRESS NOTE  Assessment/Plan: Status epilepticus, generalized convulsive (03/07/2012) - load with dilantin, resume other anti seizure medications, patient one episode of seizure after this. His vipmat dose was increase. - neurology: recommended additional keppra and Dilantin.  - No further seizures. Medically stable to be transferred to behavioral health. - start Morphine.  Suicide ideations  - psych consult, patient transfer to [facility].  -medically stable for transfer.   DVT  - continue xarelto. Continue xarelto 15mg  PO BID for 21 days days then 20 mg daily.  - has been tried on coumadin in the past and refuses blood drawn. Not a candidate.  Code Status: full Family Communication: none  Disposition Plan: [facility]   Consultants:  Psyq.  Neurology  Procedures:  one  Antibiotics:  none   HPI/Subjective: patient mildly somnolent from ativan. No complains  Objective: Filed Vitals:   03/22/12 0800 03/22/12 1612 03/22/12 2121 03/23/12 0614  BP: 135/82 128/81 130/68 138/88  Pulse: 88 72 83 81  Temp:  98.3 F (36.8 C) 97.4 F (36.3 C) 98.5 F (36.9 C)  TempSrc:  Oral Oral Oral  Resp:  16 18 20   Height:      Weight:  70.6 kg (155 lb 10.3 oz)    SpO2:  96% 98% 96%    Intake/Output Summary (Last 24 hours) at 03/23/12 0831 Last data filed at 03/22/12 1200  Gross per 24 hour  Intake    480 ml  Output      0 ml  Net    480 ml   Filed Weights   03/21/12 1300 03/22/12 1612  Weight: 72.213 kg (159 lb 3.2 oz) 70.6 kg (155 lb 10.3 oz)    Exam:  General: Alert, awake, oriented x3, in no acute distress.  HEENT: No bruits, no goiter.  Heart: Regular rate and rhythm, without murmurs, rubs, gallops.  Lungs: Good air movement, bilateral air movement.  Abdomen: Soft, nontender, nondistended, positive bowel sounds.  Neuro: Grossly intact, nonfocal.   Data Reviewed: Basic Metabolic Panel:  Lab 03/20/12 4098 03/18/12 0804  NA 138 139  K 4.1 4.3  CL  108 108  CO2 20 22  GLUCOSE 96 107*  BUN 13 14  CREATININE 0.87 0.87  CALCIUM 9.0 8.8  MG -- --  PHOS -- --   Liver Function Tests:  Lab 03/18/12 0804  AST 24  ALT 21  ALKPHOS 109  BILITOT 0.1*  PROT 6.9  ALBUMIN 3.1*   No results found for this basename: LIPASE:5,AMYLASE:5 in the last 168 hours No results found for this basename: AMMONIA:5 in the last 168 hours CBC:  Lab 03/20/12 1055 03/18/12 0804  WBC 4.3 4.2  NEUTROABS 2.8 2.4  HGB 9.0* 10.9*  HCT 33.2* 34.8*  MCV 80.2 79.5  PLT 269 250   Cardiac Enzymes: No results found for this basename: CKTOTAL:5,CKMB:5,CKMBINDEX:5,TROPONINI:5 in the last 168 hours BNP (last 3 results) No results found for this basename: PROBNP:3 in the last 8760 hours CBG:  Lab 03/20/12 1337  GLUCAP 107*    No results found for this or any previous visit (from the past 240 hour(s)).   Studies: No results found.  Scheduled Meds:    . FLUoxetine  20 mg Oral Daily  . folic acid  1 mg Oral Daily  . lacosamide  200 mg Oral BID  . levETIRAcetam  1,500 mg Oral BID  . multivitamin with minerals  1 tablet Oral Daily  . phenytoin  200 mg Oral BID  . Rivaroxaban  15 mg Oral BID  . sulfamethoxazole-trimethoprim  1 tablet Oral Q12H  . thiamine  100 mg Oral Daily   Or  . thiamine  100 mg Intravenous Daily   Continuous Infusions:    Marinda Elk  [facility] Pager [phone].  If 8PM-8AM, please contact night-coverage at [email or web address], password [facility] 03/23/2012, 8:31 AM  LOS: 3 days
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
## 2012-03-24 ENCOUNTER — Encounter (HOSPITAL_COMMUNITY): Payer: Self-pay | Admitting: Psychiatry

## 2012-03-24 DIAGNOSIS — F329 Major depressive disorder, single episode, unspecified: Secondary | ICD-10-CM | POA: Diagnosis present

## 2012-03-24 MED ORDER — RIVAROXABAN 15 MG PO TABS
15.0000 mg | ORAL_TABLET | Freq: Two times a day (BID) | ORAL | Status: DC
  Administered 2012-03-24 – 2012-03-25 (×3): 15 mg via ORAL
  Filled 2012-03-24 (×7): qty 1

## 2012-03-24 MED ORDER — LEVETIRACETAM 750 MG PO TABS
1500.0000 mg | ORAL_TABLET | Freq: Two times a day (BID) | ORAL | Status: DC
  Administered 2012-03-24 – 2012-03-25 (×3): 1500 mg via ORAL
  Filled 2012-03-24 (×7): qty 2

## 2012-03-24 MED ORDER — LACOSAMIDE 50 MG PO TABS
200.0000 mg | ORAL_TABLET | Freq: Two times a day (BID) | ORAL | Status: DC
  Administered 2012-03-24 – 2012-03-25 (×3): 200 mg via ORAL
  Filled 2012-03-24: qty 4

## 2012-03-24 MED ORDER — PHENYTOIN SODIUM EXTENDED 100 MG PO CAPS
200.0000 mg | ORAL_CAPSULE | Freq: Two times a day (BID) | ORAL | Status: DC
  Administered 2012-03-24 – 2012-03-25 (×3): 200 mg via ORAL
  Filled 2012-03-24 (×7): qty 2

## 2012-03-24 NOTE — Progress Notes (Signed)
[facility] Group Notes:  (Counselor/Nursing/MHT/Case Management/Adjunct)  03/24/2012 1:08 PM  Type of Therapy:  Psychoeducational Skills  Participation Level:  Did Not Attend  Summary of Progress/Problems: [name] [name] did not attend a psycho-education group that focused on using quality time with support systems/individuals to engage in health coping skills.   Wandra Scot 03/24/2012, 1:08 PM

## 2012-03-24 NOTE — Progress Notes (Signed)
Patient Discharge Instructions:  Patient was transferred to [facility] ED entire medical record is made available via [facility]/ Epic access.  Jerelene Redden, 03/24/2012, 1:46 PM

## 2012-03-24 NOTE — Progress Notes (Signed)
Psychoeducational Group Note  Date:  03/24/2012 Time:  1100  Group Topic/Focus:  Recovery Goals:   The focus of this group is to identify appropriate goals for recovery and establish a plan to achieve them.  Participation Level:  Did Not Attend  Dalia Heading 03/24/2012, 3:17 PM

## 2012-03-24 NOTE — [facility] Suicide Risk Assessment (Signed)
Suicide Risk Assessment  Admission Assessment     Nursing information obtained from:  Patient Demographic factors:  Male;Divorced or widowed;Caucasian;Low socioeconomic status Current Mental Status:  Suicidal ideation indicated by patient Loss Factors:  Loss of significant relationship Historical Factors:  Impulsivity Risk Reduction Factors:  Sense of responsibility to family;Religious beliefs about death;Employed;Positive social support;Positive therapeutic relationship  CLINICAL FACTORS:   Depression:   Anhedonia Hopelessness Insomnia  COGNITIVE FEATURES THAT CONTRIBUTE TO RISK: None identified  SUICIDE RISK:   Moderate:  Frequent suicidal ideation with limited intensity, and duration, some specificity in terms of plans, no associated intent, good self-control, limited dysphoria/symptomatology, some risk factors present, and identifiable protective factors, including available and accessible social support.  PLAN OF CARE: Provide a safe environment                              Individual/group therapy                              Grief/Loss                              Assess for psychotropics   [name] [name] A 03/24/2012, 6:55 PM

## 2012-03-24 NOTE — Clinical Social Work Note (Signed)
Aftercare Planning Group: 03/24/2012 9:45 AM  Pt did not attend d/c planning group on this date.    [facility] Group Note : Clinical Social Worker Group Therapy  03/24/2012  1:15 PM  Type of Therapy:  Group Therapy - Process Group  Participation Level: Did Not Attend group with speaker from [employer]  Reyes Ivan, LCSWA 03/24/2012 3:00 pm

## 2012-03-24 NOTE — Progress Notes (Signed)
D:  Patient has been in bed all day.  He has refused to attend any groups or meals.  He will not get out of bed for medications.  When asked he becomes irritable and states he will get up when he feels like it.  States the ED doctor told him to rest after having a seizure yesterday.  He rates his depression at 7 and hopelessness at 6, and he denies anxiety or thoughts of self harm.   A:  Encouraged patient to at least get up for meals, but when he would not I ordered trays to be brought to him.  I also delivered anticonvulsant medications to his room this morning and changed the times on the evening doses per his request.   R:  Irritable.  Refusing to interact at this time.

## 2012-03-24 NOTE — [facility] Suicide Risk Assessment (Signed)
Suicide Risk Assessment  Admission Assessment     Nursing information obtained from:  Patient Demographic factors:  Male;Divorced or widowed;Caucasian;Low socioeconomic status Current Mental Status:  Suicidal ideation indicated by patient Loss Factors:  Loss of significant relationship Historical Factors:  Impulsivity Risk Reduction Factors:  Sense of responsibility to family;Religious beliefs about death;Employed;Positive social support;Positive therapeutic relationship  CLINICAL FACTORS:   Depression:   Comorbid alcohol abuse/dependence Insomnia Alcohol/Substance Abuse/Dependencies Medical Diagnoses and Treatments/Surgeries  COGNITIVE FEATURES THAT CONTRIBUTE TO RISK: None Identified   SUICIDE RISK:   Moderate:  Frequent suicidal ideation with limited intensity, and duration, some specificity in terms of plans, no associated intent, good self-control, limited dysphoria/symptomatology, some risk factors present, and identifiable protective factors, including available and accessible social support.  PLAN OF CARE: Evaluate further                              Work on coping skills                              Address the mood fluctuations/depression                              Grief/Loss   Maekayla Giorgio A 03/24/2012, 1:41 PM
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
## 2012-03-24 NOTE — Progress Notes (Signed)
Psychoeducational Group Note  Date:  03/24/2012 Time:  2000  Group Topic/Focus:  Wrap-Up Group:   The focus of this group is to help patients review their daily goal of treatment and discuss progress on daily workbooks.  Participation Level:  Did Not Attend  Participation Quality:  Drowsy  Affect:  Appropriate  Cognitive:  Appropriate  Insight:  None  Engagement in Group:  None  Additional Comments:  Patient was invited to group, but remained in bed asleep.   Lyndee Hensen 03/24/2012, 11:56 PM

## 2012-03-25 ENCOUNTER — Encounter (HOSPITAL_COMMUNITY): Payer: Self-pay | Admitting: *Deleted

## 2012-03-25 DIAGNOSIS — G40909 Epilepsy, unspecified, not intractable, without status epilepticus: Secondary | ICD-10-CM

## 2012-03-25 DIAGNOSIS — I82409 Acute embolism and thrombosis of unspecified deep veins of unspecified lower extremity: Secondary | ICD-10-CM

## 2012-03-25 DIAGNOSIS — G40401 Other generalized epilepsy and epileptic syndromes, not intractable, with status epilepticus: Secondary | ICD-10-CM

## 2012-03-25 DIAGNOSIS — F329 Major depressive disorder, single episode, unspecified: Secondary | ICD-10-CM

## 2012-03-25 DIAGNOSIS — F1020 Alcohol dependence, uncomplicated: Secondary | ICD-10-CM

## 2012-03-25 LAB — URINALYSIS, ROUTINE W REFLEX MICROSCOPIC
Bilirubin Urine: NEGATIVE
Hgb urine dipstick: NEGATIVE
Ketones, ur: NEGATIVE mg/dL
Nitrite: NEGATIVE
Protein, ur: NEGATIVE mg/dL
Urobilinogen, UA: 0.2 mg/dL (ref 0.0–1.0)

## 2012-03-25 LAB — PHENYTOIN LEVEL, TOTAL: Phenytoin Lvl: 17.5 ug/mL (ref 10.0–20.0)

## 2012-03-25 LAB — POCT I-STAT, CHEM 8
Calcium, Ion: 1.17 mmol/L (ref 1.12–1.23)
Creatinine, Ser: 1 mg/dL (ref 0.50–1.35)
Glucose, Bld: 109 mg/dL — ABNORMAL HIGH (ref 70–99)
HCT: 37 % — ABNORMAL LOW (ref 39.0–52.0)
Hemoglobin: 12.6 g/dL — ABNORMAL LOW (ref 13.0–17.0)
TCO2: 22 mmol/L (ref 0–100)

## 2012-03-25 MED ORDER — DIAZEPAM 5 MG/ML IJ SOLN
5.0000 mg | Freq: Once | INTRAMUSCULAR | Status: AC
  Administered 2012-03-25: 5 mg via INTRAMUSCULAR

## 2012-03-25 MED ORDER — LORAZEPAM 2 MG/ML IJ SOLN
2.0000 mg | Freq: Once | INTRAMUSCULAR | Status: AC
  Administered 2012-03-25: 2 mg via INTRAMUSCULAR

## 2012-03-25 MED ORDER — LEVETIRACETAM 500 MG PO TABS: 2000.0000 mg | ORAL_TABLET | Freq: Two times a day (BID) | ORAL | Status: DC

## 2012-03-25 MED ORDER — LORAZEPAM 2 MG/ML IJ SOLN
INTRAMUSCULAR | Status: AC
  Administered 2012-03-25: 14:00:00
  Filled 2012-03-25: qty 3

## 2012-03-25 MED ORDER — TOPIRAMATE 25 MG PO TABS
25.0000 mg | ORAL_TABLET | Freq: Two times a day (BID) | ORAL | Status: DC
  Administered 2012-03-25: 25 mg via ORAL
  Filled 2012-03-25: qty 1

## 2012-03-25 MED ORDER — LORAZEPAM 2 MG/ML IJ SOLN
5.0000 mg | Freq: Once | INTRAMUSCULAR | Status: AC
  Administered 2012-03-25: 5 mg via INTRAMUSCULAR

## 2012-03-25 MED ORDER — LEVETIRACETAM 750 MG PO TABS
2000.0000 mg | ORAL_TABLET | Freq: Two times a day (BID) | ORAL | Status: DC
  Filled 2012-03-25: qty 1

## 2012-03-25 MED ORDER — LORAZEPAM 2 MG/ML IJ SOLN
INTRAMUSCULAR | Status: AC
  Filled 2012-03-25: qty 1

## 2012-03-25 NOTE — ED Notes (Signed)
Patient is attempting to void at this time.

## 2012-03-25 NOTE — Tx Team (Signed)
Interdisciplinary Treatment Plan Update (Adult)  Date:  03/25/2012  Time Reviewed:  10:05 AM   Progress in Treatment: Attending groups: Yes Participating in groups:  Yes Taking medication as prescribed: Yes Tolerating medication:  Yes Family/Significant othe contact made: No, pt refused Patient understands diagnosis:  Yes Discussing patient identified problems/goals with staff:  Yes Medical problems stabilized or resolved:  Yes Denies suicidal/homicidal ideation: Yes Issues/concerns per patient self-inventory:  None identified Other: N/A  New problem(s) identified: None Identified  Reason for Continuation of Hospitalization: Anxiety Depression Medication stabilization  Interventions implemented related to continuation of hospitalization: mood stabilization, medication monitoring and adjustment, group therapy and psycho education, suicide risk assessment, collateral contact, aftercare planning, ongoing physician assessments and safety checks q 15 mins  Additional comments: N/A  Estimated length of stay: 3-5 days  Discharge Plan: CSW is assessing for appropriate referrals.    New goal(s): N/A  Review of initial/current patient goals per problem list:    2.  Goal (s): Reduce depressive symptoms from a 10 to a 3  Met:  No  Target date: 3-5 days  As evidenced by: Pt rates at a 7-8 today.    3.  Goal (s): Reduce anxiety symptoms from a 10 to a 3  Met:  Yes  Target date:  3-5 days  As evidenced by: Pt rates at a 0 today.    Attendees: Patient:     Family:     Physician: Geoffery Lyons, MD 03/25/2012 10:05 AM   Nursing: Roswell Miners, RN 03/25/2012 10:05 AM   Clinical Social Worker:  Reyes Ivan, LCSWA 03/25/2012  10:05 AM   Other: Burnetta Sabin, RN 03/25/2012  10:05 AM   Other:  [name], NP 03/25/2012 10:07 AM   Other:  Bubba Camp, Psyc intern 03/25/2012 10:07 AM   Other:     Other:      Scribe for Treatment Team:   Reyes Ivan 03/25/2012 10:05 AM

## 2012-03-25 NOTE — H&P (Addendum)
[facility] History and Physical  [name] [name] AVW:[id number] DOB: [birth year] DOA: 03/23/2012  Referring physician: Dr Karma Ganja PCP: Sheila Oats, MD   Chief Complaint:  sent from [facility] for generalised seizures  HPI:  31 y.o. male with major depression and seizures who has been admitted twice at [facility] for generalized seizures in past 2 weeks. His keppra and vimpat dose was increased and sent to [facility] for suicidal ideations. Today he was witnessed to have 4 generalize tonic clonic seizures with eye rolling movement and enuresis. Patient has not missed any doses of his AEDs. In the ED he was given IM valium and lorazepam. He is sleepy and fatigued on my exam. informs having body aches and severe headache which occurs to his after seizures. Denies any tongue bite. denies nausea , vomiting. cehst pain, palpitations, weakness, abdominal pain, bowel symptoms.  Denies any blurry vision or fever.  Neurology consulted and [facility] hospitalist asked to admit the patient.  During his last hospitalization he was found to have DVT of rt upper extremities and discharged on xealto as he was not a candidate for coumadin due to poor IV access and  blood draws.   Review of Systems:  Constitutional: Denies fever, chills, diaphoresis, appetite change and fatigue. c/o headache HEENT: Denies photophobia, eye pain, redness, hearing loss, ear pain, congestion, sore throat, rhinorrhea, sneezing, mouth sores, trouble swallowing, neck pain, neck stiffness and tinnitus.   Respiratory: Denies SOB, DOE, cough, chest tightness,  and wheezing.   Cardiovascular: Denies chest pain, palpitations and leg swelling.  Gastrointestinal: Denies nausea, vomiting, abdominal pain, diarrhea, constipation, blood in stool and abdominal distention.  Genitourinary: Denies dysuria, urgency, frequency, hematuria, flank pain and difficulty urinating.  Musculoskeletal: Denies myalgias, back pain, joint swelling, arthralgias and gait  problem.  Skin: Denies pallor, rash and wound.  Neurological: , seizures, headaches.Denies dizziness, syncope, weakness, light-headedness, numbness and  Hematological: Denies adenopathy. Easy bruising, personal or family bleeding history  Psychiatric/Behavioral: depression +, Denies suicidal ideation, mood changes, confusion, nervousness, sleep disturbance and agitation   Past Medical History  Diagnosis Date  . Seizure   . Protein C deficiency   . Protein C deficiency    Past Surgical History  Procedure Date  . Insertion of vena cava filter    Social History:  reports that he has been smoking Cigarettes.  He has a 1.25 pack-year smoking history. He has never used smokeless tobacco. He reports that he drinks about 3.6 ounces of alcohol per week. He reports that he uses illicit drugs (Marijuana).  Allergies  Allergen Reactions  . Fish Allergy Anaphylaxis and Rash  . Depakote (Divalproex Sodium) Swelling  . Morphine And Related Hives    History reviewed. No pertinent family history.  Prior to Admission medications   Medication Sig Start Date End Date Taking? Authorizing Provider  lacosamide 100 MG TABS Take 1 tablet (100 mg total) by mouth 2 (two) times daily. 03/16/12  Yes Meredeth Ide, MD  levETIRAcetam (KEPPRA) 750 MG tablet Take 2 tablets (1,500 mg total) by mouth 2 (two) times daily. 03/16/12  Yes Meredeth Ide, MD  phenytoin (DILANTIN) 100 MG ER capsule Take 2 capsules (200 mg total) by mouth 2 (two) times daily. 03/16/12  Yes Meredeth Ide, MD  Rivaroxaban (XARELTO) 15 MG TABS tablet Take 1 tablet (15 mg total) by mouth 2 (two) times daily. Take 15 mg po daily for 20 days then  Continue to take 20 mg po daily 03/16/12  Yes Meredeth Ide, MD  sulfamethoxazole-trimethoprim (BACTRIM DS) 800-160 MG per tablet Take 1 tablet by mouth every 12 (twelve) hours. 03/16/12  Yes Meredeth Ide, MD  thiamine 100 MG tablet Take 1 tablet (100 mg total) by mouth daily. 03/16/12  Yes Meredeth Ide,  MD  traMADol (ULTRAM) 50 MG tablet Take 1 tablet (50 mg total) by mouth every 6 (six) hours as needed. 03/16/12  Yes Meredeth Ide, MD    Physical Exam:  Filed Vitals:   03/25/12 0601 03/25/12 1413 03/25/12 1715 03/25/12 1928  BP: 131/89 111/64  117/88  Pulse: 87 105 96 85  Temp:  99 F (37.2 C)    TempSrc:  Oral    Resp:  18 24 23   Height:      Weight:      SpO2:  99% 98% 94%    Constitutional: Vital signs reviewed.  Patient is a well-developed and well-nourished in no acute distress and cooperative with exam. Alert and oriented x3.  Head: Normocephalic and atraumatic Ear: TM normal bilaterally Mouth: no erythema or exudates, MMM Eyes: dilated pupils,  EOMI, conjunctivae normal, No scleral icterus.  Neck: Supple, Trachea midline normal ROM, No JVD, mass, thyromegaly, or carotid bruit present.  Cardiovascular: RRR, S1 normal, S2 normal, no MRG, pulses symmetric and intact bilaterally Pulmonary/Chest: CTAB, no wheezes, rales, or rhonchi Abdominal: Soft. Non-tender, non-distended, bowel sounds are normal, no masses, organomegaly, or guarding present.  GU: no CVA tenderness Musculoskeletal: No joint deformities, erythema, or stiffness, ROM full and no nontender Ext: no edema and no cyanosis, pulses palpable bilaterally (DP and PT) Hematology: no cervical, inginal, or axillary adenopathy.  Neurological: A&O x3, Strenght is normal and symmetric bilaterally, cranial nerve II-XII are grossly intact, no focal motor deficit, sensory intact to light touch bilaterally.  Skin: Warm, dry and intact. No rash, cyanosis, or clubbing.  Psychiatric: Normal mood and affect. speech and behavior is normal. Judgment and thought content normal. Cognition and memory are normal.   Labs on Admission:  Basic Metabolic Panel:  Lab 03/25/12 1610 03/20/12 1055  NA 140 138  K 4.8 4.1  CL 108 108  CO2 -- 20  GLUCOSE 109* 96  BUN 18 13  CREATININE 1.00 0.87  CALCIUM -- 9.0  MG -- --  PHOS -- --    Liver Function Tests: No results found for this basename: AST:5,ALT:5,ALKPHOS:5,BILITOT:5,PROT:5,ALBUMIN:5 in the last 168 hours No results found for this basename: LIPASE:5,AMYLASE:5 in the last 168 hours No results found for this basename: AMMONIA:5 in the last 168 hours CBC:  Lab 03/25/12 1649 03/20/12 1055  WBC -- 4.3  NEUTROABS -- 2.8  HGB 12.6* 9.0*  HCT 37.0* 33.2*  MCV -- 80.2  PLT -- 269   Cardiac Enzymes: No results found for this basename: CKTOTAL:5,CKMB:5,CKMBINDEX:5,TROPONINI:5 in the last 168 hours BNP: No components found with this basename: POCBNP:5 CBG:  Lab 03/25/12 1941 03/20/12 1337  GLUCAP 113* 107*    Radiological Exams on Admission: No results found.    Assessment/Plan Refractory epilepsy  patient having persistent generalised tonic clonic seizures despite being on muktiple medications and this is his 3rd admission in past 2 weeks for the same problem  patint had 4 episdoes of seizures today. A dmit to stepdown Neurochecks and seizure precautions Ativan 2mg  IM prn seizure .  patint seen by neurolog and appreciater recs  EEG in am and recommend to obtain outside EEG results. Recommended to start topiramate 25mg  BID. Increase levitiracetam to 2000mg  BID,and  decrease back to 1500 mg  BID once theraputic on topiramate. Recommend to continue phenytoin 200mg  BID. continue vimpat 200mg  BID   Poor IV access  appears to be an issue in past. Attempted peripheral IV access in ED but unsuccessful. patient refused central line at this time. was attempted for central line in ED  on last visit but unsuccessful . i will order for an IR guided PICC placement.   Major depression Denies suicidal at present  please inform psych consult in am for follow up patient was consulted this afternoon  by hospitalist on restarting his antidepressant and was recommended to weight he need for it and if strongly needed to start only on mediations at a lower dose if possible.     DVT of RUE -  Continue xarelto 15mg  PO BID for 14  days days then 20 mg daily.  - has been tried on coumadin in the past and refuses blood drawn.   Headache:  Prn tylenol.   generalized bodyaches  low dose moorphine prn. Avoid dilaudid  DVT prophylaxis  Diet: regular  Code Status: full Family Communication:  Non at bedside Disposition Plan: currently inpatient. Likely back to  [facility]  [facility], [facility] Pager [phone]  If 7PM-7AM, please contact night-coverage [email or web address] Password [facility] 03/25/2012, 10:15 PM   Total time spent : 70 minutes

## 2012-03-25 NOTE — ED Provider Notes (Signed)
History     CSN: [id number]  Arrival date & time 03/25/12  1358   First MD Initiated Contact with Patient 03/25/12 1536      Chief Complaint  Patient presents with  . Seizures    (Consider location/radiation/quality/duration/timing/severity/associated sxs/prior treatment) HPI Pt presents with c/o seizure.  He does not remember the seizure, but does state that he felt a metal taste in his mouth which happens prior to his seizures.  Otherwise today he states he was feeling in his usual state of health.  States he has been getting his medication regularly at [facility].  Currently c/o diffuse body pain and feels tired.  Per chart review from recent admission he is on dilantin, keppra, and was started on vimpat to help control his seizures.  He also had very difficult IV access- ultrasound guided IV and attempts at Subclavian were unsuccessful.  Neurology consult recommended IM ativan for recurrent seizure.  At time of admission his dilantin level was low.  Per EMS he had GTC seizure controlled after ativan and valium.  Upon transport he had another seizure and was treated with IM versed.  There are no other associated systemic symptoms, there are no other alleviating or modifying factors.   Past Medical History  Diagnosis Date  . Seizure   . Protein C deficiency   . Protein C deficiency     Past Surgical History  Procedure Date  . Insertion of vena cava filter     History reviewed. No pertinent family history.  History  Substance Use Topics  . Smoking status: Current Every Day Smoker -- 0.2 packs/day for 5 years    Types: Cigarettes  . Smokeless tobacco: Never Used  . Alcohol Use: 3.6 oz/week    6 Cans of beer per week      Review of Systems ROS reviewed and all otherwise negative except for mentioned in HPI  Allergies  Fish allergy; Depakote; and Morphine and related  Home Medications   Current Outpatient Rx  Name  Route  Sig  Dispense  Refill  . LACOSAMIDE 100 MG PO  TABS   Oral   Take 1 tablet (100 mg total) by mouth 2 (two) times daily.   60 tablet   2   . LEVETIRACETAM 750 MG PO TABS   Oral   Take 2 tablets (1,500 mg total) by mouth 2 (two) times daily.   120 tablet   3   . PHENYTOIN SODIUM EXTENDED 100 MG PO CAPS   Oral   Take 2 capsules (200 mg total) by mouth 2 (two) times daily.   60 capsule   2   . RIVAROXABAN 15 MG PO TABS   Oral   Take 1 tablet (15 mg total) by mouth 2 (two) times daily. Take 15 mg po daily for 20 days then  Continue to take 20 mg po daily   30 tablet   3   . SULFAMETHOXAZOLE-TMP DS 800-160 MG PO TABS   Oral   Take 1 tablet by mouth every 12 (twelve) hours.   14 tablet   0   . THIAMINE HCL 100 MG PO TABS   Oral   Take 1 tablet (100 mg total) by mouth daily.   30 tablet   0   . TRAMADOL HCL 50 MG PO TABS   Oral   Take 1 tablet (50 mg total) by mouth every 6 (six) hours as needed.   30 tablet   0     BP  111/64  Pulse 105  Temp 99 F (37.2 C) (Oral)  Resp 18  Ht 5\' 8"  (1.727 m)  Wt 154 lb (69.854 kg)  BMI 23.42 kg/m2  SpO2 99% Vitals reviewed Physical Exam Physical Examination: General appearance - alert, disheveled and tired appearing, and in no distress Mental status - alert, oriented to person, place, and time Eyes - pupils equal and reactive, extraocular eye movements intact Mouth - mucous membranes moist, pharynx normal without lesions Chest - clear to auscultation, no wheezes, rales or rhonchi, symmetric air entry Heart - normal rate, regular rhythm, normal S1, S2, no murmurs, rubs, clicks or gallops Abdomen - soft, nontender, nondistended, no masses or organomegaly Neurological - alert, oriented, normal speech, strength 5/5 in extremities x 4, sensation intact, creanial nerves 2-12 tested and intact, pt appears postictal but is oriented x 3 Extremities - peripheral pulses normal, no pedal edema, no clubbing or cyanosis Skin - normal coloration and turgor, no rashes Psych- calm and  cooperative, sleepy  ED Course  Procedures (including critical care time)  7:14 PM  Pt just had another seizure- generalized tonic/clonic activity- lasted approx .  Ativan 2mg  IM given.  Pt maintained his airway and O2 sats are 100%.  awainting dilantin level.   Pt has very difficult IV access.  Unable to obtain IV today.  During prior admission (chart reviewed in detail) he had mutliple attempts, u/s guided IV attempt and central line attempted.  All were unsuccessful, seizure meds were given po and neurology consult recommended ativan IM for recurrent seizures.    7:43 PM  D/w neurology- he does not have any recommendations at this time, but knows this patient well and will consult.  Will d/w [facility] for admission.    9:00 PM received callback from [facility]- pt to be admitted to med/surg bed, neurology is here consulting.    Labs Reviewed  PHENYTOIN LEVEL, TOTAL  URINALYSIS, ROUTINE W REFLEX MICROSCOPIC   No results found.   1. Major depression   2. EtOH dependence   3. Seizure disorder       MDM  Pt presents with c/o seizure from [facility].  He has a hx of seizure disorder that has been difficult to control.  He was recently admitted to [facility] for seizures as well and was discharged to [facility] for depression.  During that admission he was started on vimpat- and had low dilantin level.  Today dilantin level is normal, however he continues to have seizures in the department.  I have consulted neurology and [facility] will admit patient for further evaluation and management until he is stabilized to return to  [facility].          Ethelda Chick, MD 03/26/12 [phone]

## 2012-03-25 NOTE — Consult Note (Addendum)
[facility] Medical Consultation  Ej Xxx-Lenker ZOX:[id number] DOB: 1981-02-03 DOA: 03/23/2012 PCP: Sheila Oats, MD   Requesting physician: Dr. Dub Mikes Date of consultation: 03/25/12 Reason for consultation: recommendations on use of antidepressant medication  Impression/Recommendations Major depression disorder Seizure disorder Alcohol abuse    1. Major depressive disorder and need for antidepressants in patient with seizures: at this point after reviewing patient records and discussing with neurology there is a high risk of antidepressants decreasing threshold for seizures and unable to be predicted. At this moment my recommendations are to evaluate, run risk with patient and decide on starting medication or not, knowing risk for seizing. Also I will try low dose and one medication at a time if  The decision to start patient on antidepressant is taken. 2. Seizure disorder: continue current antiepileptic regimen as indicated by neurology. 3. Alcohol abuse: continue psychotherapy; no withdrawal activity per nursing staff.  Please contact me if I can be of assistance in the meanwhile. Thank you for this consultation.  Chief Complaint: recommendations on antidepressants and seizure  HPI:  31 y/o male with hx of seizure disorder and alcohol abuse; admitted due to MDD; which is at this point apparently in needs of antidepressant medications and psychiatry will like to run risk of using them especially with hx of seizure disorder. No fever, no CP, no dysuria and no other acute complaints. Last seizure activity was on 03/21/12.  Review of Systems:  Negative except as otherwise mentioned on HPI.  Past Medical History  Diagnosis Date  . Seizure   . Protein C deficiency   . Protein C deficiency    Past Surgical History  Procedure Date  . Insertion of vena cava filter    Social History:  reports that he has been smoking Cigarettes.  He has a 1.25 pack-year smoking history. He  has never used smokeless tobacco. He reports that he drinks about 3.6 ounces of alcohol per week. He reports that he uses illicit drugs (Marijuana).  Allergies  Allergen Reactions  . Fish Allergy Anaphylaxis and Rash  . Depakote (Divalproex Sodium) Swelling  . Morphine And Related Hives   History reviewed. No pertinent family history.  Prior to Admission medications   Medication Sig Start Date End Date Taking? Authorizing Provider  lacosamide 100 MG TABS Take 1 tablet (100 mg total) by mouth 2 (two) times daily. 03/16/12  Yes Meredeth Ide, MD  levETIRAcetam (KEPPRA) 750 MG tablet Take 2 tablets (1,500 mg total) by mouth 2 (two) times daily. 03/16/12  Yes Meredeth Ide, MD  phenytoin (DILANTIN) 100 MG ER capsule Take 2 capsules (200 mg total) by mouth 2 (two) times daily. 03/16/12  Yes Meredeth Ide, MD  Rivaroxaban (XARELTO) 15 MG TABS tablet Take 1 tablet (15 mg total) by mouth 2 (two) times daily. Take 15 mg po daily for 20 days then  Continue to take 20 mg po daily 03/16/12  Yes Meredeth Ide, MD  sulfamethoxazole-trimethoprim (BACTRIM DS) 800-160 MG per tablet Take 1 tablet by mouth every 12 (twelve) hours. 03/16/12  Yes Meredeth Ide, MD  thiamine 100 MG tablet Take 1 tablet (100 mg total) by mouth daily. 03/16/12  Yes Meredeth Ide, MD  traMADol (ULTRAM) 50 MG tablet Take 1 tablet (50 mg total) by mouth every 6 (six) hours as needed. 03/16/12  Yes Meredeth Ide, MD   Physical Exam: Blood pressure 111/64, pulse 105, temperature 99 F (37.2 C), temperature source Oral, resp. rate 18, height 5\' 8"  (  1.727 m), weight 69.854 kg (154 lb), SpO2 99.00%. Filed Vitals:   03/24/12 1700 03/25/12 0600 03/25/12 0601 03/25/12 1413  BP: 99/57 143/85 131/89 111/64  Pulse: 66 66 87 105  Temp:  97.9 F (36.6 C)  99 F (37.2 C)  TempSrc:    Oral  Resp:  16  18  Height:      Weight:      SpO2:    99%     General:  NAD  Eyes: PERRL, no icterus and EOMI  ENT: no erythema or exudates  Neck: no  JVD, no bruits  Cardiovascular: RRR, no rubs or gallops  Respiratory: CTA  Abdomen: soft, NT, ND, positive BS  Skin: no rashes or petechiae  Musculoskeletal: FROM, no joint swelling  Psychiatric: flat affect and decrease eye contact;   Neurologic: no focal deficit; able to follow commands properly, CN intact  Labs on Admission:  Basic Metabolic Panel:  Lab 03/20/12 1610  NA 138  K 4.1  CL 108  CO2 20  GLUCOSE 96  BUN 13  CREATININE 0.87  CALCIUM 9.0  MG --  PHOS --   CBC:  Lab 03/20/12 1055  WBC 4.3  NEUTROABS 2.8  HGB 9.0*  HCT 33.2*  MCV 80.2  PLT 269   CBG:  Lab 03/20/12 1337  GLUCAP 107*    Radiological Exams on Admission: No results found.  Time spent: <30 minutes  Damean Poffenberger [facility] Pager [phone]  If 7PM-7AM, please contact night-coverage [email or web address] Password [facility] 03/25/2012, 3:28 PM

## 2012-03-25 NOTE — ED Notes (Signed)
ZOX:[id number]<WU> Expected date:<BR> Expected time:<BR> Means of arrival:Ambulance<BR> Comments:<BR> Seizure-[facility] pt

## 2012-03-25 NOTE — Progress Notes (Signed)
Patient ID: [name] [name], male   DOB: [birth year], 31 y.o.   MRN: [id number]   D:  Pt refused to get out of bed stating that he wasn't feeling well.  Pt was irritable, stating "Let me get this out there". He informed the writer that he knows all about the groups and "isn't just laying in bed for no reason".  Pt refused to come up and get meds.  A: Writer took pt's meds to his room. Support and encouragement was offered. 15 min checks continued for safety.  R: Pt remains safe.

## 2012-03-25 NOTE — ED Notes (Signed)
Spoke with IV team to Est IV access

## 2012-03-25 NOTE — ED Notes (Signed)
Currently awaiting Hospitalist assessment and orders prior to transferring patient to the floor

## 2012-03-25 NOTE — Progress Notes (Signed)
Patient ID: [name] [name], male   DOB: 1980-09-08, 31 y.o.   MRN: [id number] He has been up and to group today. Has been pleasant and cooperative today. He Has not c/o any discomfort.  When asked if he had aura before he had a seizure he said The he would get a metallic teast right before he had one. He spoke of being here for depression and that he did not have a substance abuse problem.

## 2012-03-25 NOTE — ED Notes (Signed)
Admitting MD request that he will change bed to stepdown d/t patient frequent seizure activity

## 2012-03-25 NOTE — ED Notes (Signed)
Iv team unable to get access at this time and patient refuses to have phlebotomy tech stick for lab work. Will make admitting md aware of the same

## 2012-03-25 NOTE — ED Notes (Signed)
Patient refused blood draw for labs. RN made aware 

## 2012-03-25 NOTE — Consult Note (Signed)
Reason for Consult:Seizures Referring Physician: Jerelyn Scott  CC: Seizures  History is obtained from:Patient  HPI: [name] [name] is a 31 y.o. male well known to me from previous recent admissions with a difficult to control seizure disorder. He has been having an increased number of seizures for the past few weeks despite AED therapy. He typically has a seizure every other week prior to admission on keppra and dilantin therapy.   With one of his seizures that I witnessed, he had eye deviation to the right, however this was in the setting of status epilepticus and therefore eye deviation is a less reliable indicator of focal vs. generalized seizures. His previous EEG was normal, however his seizures that I have witnessedare very clearly epileptic in nature.  He was stable from a seizure perspective earlier and was discharged to behavioral health where he missed a few doses of his AEDs and presented on the 15th with recurrent seizure activity in the setting of missed doses. More recently, however he has not missed any doses of his medication and has had three seizures today, two at his facility and one here in the ED. He has been treated with ativan.   His care is complicated by the difficulty in obtaining IV access and his protein C deficiency.   ROS: A 14 point ROS was performed and is negative except as noted in the HPI.  Past Medical History  Diagnosis Date  . Seizure   . Protein C deficiency   . Protein C deficiency     Family History: Epilepsy  Social History: Tob: Quit prior to hospitalization.   Exam: Current vital signs: BP 117/88  Pulse 85  Temp 99 F (37.2 C) (Oral)  Resp 23  Ht 5\' 8"  (1.727 m)  Wt 69.854 kg (154 lb)  BMI 23.42 kg/m2  SpO2 94% Vital signs in last 24 hours: Temp:  [97.9 F (36.6 C)-99 F (37.2 C)] 99 F (37.2 C) (11/20 1413) Pulse Rate:  [66-105] 85  (11/20 1928) Resp:  [16-24] 23  (11/20 1928) BP: (111-143)/(64-89) 117/88 mmHg (11/20  1928) SpO2:  [94 %-99 %] 94 % (11/20 1928)  General: In bed, appears tired Mental Status: Patient is awake, but tired, oriented iented to person, "hospital", month, year, and situation. Patient is able to give a clear and coherent history. He has impaired memory from earlier today.  Cranial Nerves: II: Visual Fields are full. Pupils are equal, round, and reactive to light.  Discs are difficult to visualize. III,IV, VI: EOMI without ptosis or diploplia.  V: Facial sensation is symmetric to temperature VII: Facial movement is symmetric.  VIII: hearing is intact to voice X: Uvula elevates symmetrically XI: Shoulder shrug is symmetric. XII: tongue is midline without atrophy or fasciculations.  Motor: Tone is normal. Bulk is normal. 5/5 strength was present in all four extremities.  Sensory: Sensation is symmetric to light touch and  in the arms and legs. Deep Tendon Reflexes: 2+ and symmetric in the biceps and patellae.  Cerebellar: FNF  intact bilaterally Gait: Did not assess 2/2 post-ictal state.   I have reviewed labs in epic and the results pertinent to this consultation are: BMP, HCT unremarkable.  UA negative.   Impression: Refractory epilepsy with continued seizures despite three AEDs. Vimpat and phenytoin have a similar mechanism of action and he may benefit from another AED with different mechanism more than this combination. Would start topiramate and titrate upwards over 6 weeks to 200mg  BID. At that point, would wean  vimpat and stop it. He will need serial phenytoin levels during this titration as topiramate can increase dilantin concentrations. Also, previous EEG was normal, not helpful in differentiating generalized from partial epilepsy. Will repeat EEG. We could also increase keppra to 2000mg  BID for a while getting his topiramate under better control.   Recommendations: 1) Start topiramate 25mg  BID 2) EEG in the AM.  3) Ativan 2mg  IV or IM prn seizure activity.  4)  Increase levitiracetam to 2000mg  BID, would decrease back to 1500mg  BID once theraputic on topiramate.  5) continue phenytoin 200mg  BID 6) continue vimpat 200mg  BID 7) Tomorrow during business hours, would obtain previous EEGs from outside neurologist to see if he has ever had focal vs generalized activity recorded.   Ritta Slot, MD [facility] [phone]  If 7pm- 7am, please page neurology on call at [phone].

## 2012-03-25 NOTE — ED Notes (Addendum)
Per EMS, pt picked up from [facility] d/t seizure episode.  Staff reports pt having 3 episodes of seizures.  Upon arrival to the facility, pt was mildly post-ictal.  Upon arrival in the ED EMS bay, pt had a grand-mal seizure episode lasting for ~ .  Versed 5mg  IM given L deltoid.  Pt also reports ativan 5mg  and valium 5mg  IM prior to transport to the ED.  Pt at present is post-ictal.  Pt reports remembering being in his bed when he had the first seizure episode.  Pt reports grounding his R lower tooth from the seizure.  Pt reports generalized muscular pain per EMS.

## 2012-03-25 NOTE — ED Notes (Signed)
Room assignment still not available at this time

## 2012-03-25 NOTE — ED Notes (Signed)
Lab bedside.

## 2012-03-25 NOTE — BHH Counselor (Signed)
Adult Comprehensive Assessment  Patient ID: [name] [name], male   DOB: Sep 16, 1980, 31 y.o.   MRN: [id number]  Information Source: Information source: Patient  Current Stressors:  Educational / Learning stressors: N/A Employment / Job issues: N/A Family Relationships: N/A Surveyor, quantity / Lack of resources (include bankruptcy): N/A Housing / Lack of housing: N/A Physical health (include injuries & life threatening diseases): N/A Social relationships: N/A Substance abuse: N/A Bereavement / Loss: Lost daughter and daughter's mother in a car accident in 12/20/2022  Living/Environment/Situation:  Living Arrangements: Alone Living conditions (as described by patient or guardian): Pt lives in an apartment alone in [place] How long has patient lived in current situation?: 2 years What is atmosphere in current home: Comfortable  Family History:  Marital status: Single Does patient have children?: Yes How many children?: 1  How is patient's relationship with their children?: 33 year old daughter passed away in 12-20-2022  Childhood History:  By whom was/is the patient raised?: Both parents;Mother/father and step-parent Additional childhood history information: Pt states that he had a good childhood Description of patient's relationship with caregiver when they were a child: Pt states that he got along with parents well Patient's description of current relationship with people who raised him/her: Pt still has a good relationship with his step dad Does patient have siblings?: Yes Number of Siblings: 1  Description of patient's current relationship with siblings: Pt states that he has a good relationship with his sister Did patient suffer any verbal/emotional/physical/sexual abuse as a child?: No Did patient suffer from severe childhood neglect?: No Has patient ever been sexually abused/assaulted/raped as an adolescent or adult?: No Was the patient ever a victim of a crime or a disaster?: No Witnessed  domestic violence?: No Has patient been effected by domestic violence as an adult?: No  Education:  Highest grade of school patient has completed: Certificate from college Currently a student?: No Learning disability?: No  Employment/Work Situation:   Employment situation: Employed 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 How long has patient been employed?: 10 years Patient's job has been impacted by current illness: No What is the longest time patient has a held a job?: 10 years 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 Has patient ever been in the [employer]?: No Has patient ever served in combat?: No  Financial Resources:   Financial resources: Income from employment Does patient have a representative payee or guardian?: No  Alcohol/Substance Abuse:   What has been your use of drugs/alcohol within the last 12 months?: Pt states that he drinks alcohol occasionally If attempted suicide, did drugs/alcohol play a role in this?: No Alcohol/Substance Abuse Treatment Hx: Denies past history Has alcohol/substance abuse ever caused legal problems?: No  Social Support System:   Patient's Community Support System: Good Describe Community Support System: P states that his sister and step dad are good supports Type of faith/religion: None reported How does patient's faith help to cope with current illness?: N/A  Leisure/Recreation:   Leisure and Hobbies: Pt states that he has no hobbies right now  Strengths/Needs:   What things does the patient do well?: Hard worker In what areas does patient struggle / problems for patient: Depression  Discharge Plan:   Does patient have access to transportation?: Yes Will patient be returning to same living situation after discharge?: Yes Currently receiving community mental health services: No If no, would patient like referral for services when discharged?: Yes (What county?) [facility]) Does patient  have financial barriers related to discharge medications?: No  Summary/Recommendations:  Patient is a 31 year old Caucasian Male with a diagnosis of Alcohol Abuse and MDD.  Patient lives in [place] alone.  Patient will benefit from crisis stabilization, medication evaluation, group therapy and psycho education in addition to case management for discharge planning.      Horton, Salome Arnt. 03/25/2012

## 2012-03-25 NOTE — ED Notes (Signed)
Admitting MD and IV team at patient bedside at this time

## 2012-03-25 NOTE — ED Notes (Signed)
[name] [name], (admitting provider) is aware that patient refused lab draw and does not have IV access at this time, has history of difficulty with access, and recommendation by neurologist is IM ativan.

## 2012-03-26 ENCOUNTER — Inpatient Hospital Stay (HOSPITAL_COMMUNITY)
Admission: AD | Admit: 2012-03-26 | Discharge: 2012-03-26 | Disposition: A | Discharge status: Home / Self Care | Payer: Self-pay | Source: Ambulatory Visit | Attending: Internal Medicine | Admitting: Internal Medicine

## 2012-03-26 ENCOUNTER — Encounter (HOSPITAL_COMMUNITY): Payer: Self-pay

## 2012-03-26 ENCOUNTER — Inpatient Hospital Stay (HOSPITAL_COMMUNITY)
Admission: AD | Admit: 2012-03-26 | Discharge: 2012-04-20 | DRG: 101 | Payer: MEDICAID | Source: Ambulatory Visit | Attending: Internal Medicine | Admitting: Internal Medicine

## 2012-03-26 ENCOUNTER — Inpatient Hospital Stay (HOSPITAL_COMMUNITY): Payer: Self-pay

## 2012-03-26 DIAGNOSIS — K029 Dental caries, unspecified: Secondary | ICD-10-CM | POA: Diagnosis present

## 2012-03-26 DIAGNOSIS — G40319 Generalized idiopathic epilepsy and epileptic syndromes, intractable, without status epilepticus: Principal | ICD-10-CM | POA: Diagnosis present

## 2012-03-26 DIAGNOSIS — G40901 Epilepsy, unspecified, not intractable, with status epilepticus: Secondary | ICD-10-CM

## 2012-03-26 DIAGNOSIS — D6859 Other primary thrombophilia: Secondary | ICD-10-CM | POA: Diagnosis present

## 2012-03-26 DIAGNOSIS — F333 Major depressive disorder, recurrent, severe with psychotic symptoms: Secondary | ICD-10-CM | POA: Diagnosis present

## 2012-03-26 DIAGNOSIS — R52 Pain, unspecified: Secondary | ICD-10-CM

## 2012-03-26 DIAGNOSIS — I82629 Acute embolism and thrombosis of deep veins of unspecified upper extremity: Secondary | ICD-10-CM | POA: Diagnosis present

## 2012-03-26 DIAGNOSIS — I82409 Acute embolism and thrombosis of unspecified deep veins of unspecified lower extremity: Secondary | ICD-10-CM

## 2012-03-26 DIAGNOSIS — F329 Major depressive disorder, single episode, unspecified: Secondary | ICD-10-CM

## 2012-03-26 DIAGNOSIS — K0530 Chronic periodontitis, unspecified: Secondary | ICD-10-CM | POA: Diagnosis present

## 2012-03-26 DIAGNOSIS — R45851 Suicidal ideations: Secondary | ICD-10-CM

## 2012-03-26 DIAGNOSIS — L0390 Cellulitis, unspecified: Secondary | ICD-10-CM

## 2012-03-26 DIAGNOSIS — F17200 Nicotine dependence, unspecified, uncomplicated: Secondary | ICD-10-CM | POA: Diagnosis present

## 2012-03-26 DIAGNOSIS — Z765 Malingerer [conscious simulation]: Secondary | ICD-10-CM

## 2012-03-26 DIAGNOSIS — F1020 Alcohol dependence, uncomplicated: Secondary | ICD-10-CM

## 2012-03-26 DIAGNOSIS — G40909 Epilepsy, unspecified, not intractable, without status epilepticus: Secondary | ICD-10-CM

## 2012-03-26 DIAGNOSIS — F1910 Other psychoactive substance abuse, uncomplicated: Secondary | ICD-10-CM | POA: Diagnosis present

## 2012-03-26 MED ORDER — HYDROMORPHONE HCL PF 1 MG/ML IJ SOLN: 1.0000 mg | Freq: Once | INTRAMUSCULAR | Status: AC | PRN

## 2012-03-26 MED ORDER — MORPHINE SULFATE 2 MG/ML IJ SOLN: 2.0000 mg | Freq: Once | INTRAMUSCULAR | Status: DC

## 2012-03-26 MED ORDER — LORAZEPAM 2 MG/ML IJ SOLN
2.0000 mg | INTRAMUSCULAR | Status: DC | PRN
  Administered 2012-03-26 – 2012-04-16 (×28): 2 mg via INTRAMUSCULAR
  Filled 2012-03-26: qty 1
  Filled 2012-03-26: qty 2
  Filled 2012-03-26 (×29): qty 1

## 2012-03-26 MED ORDER — TRAMADOL HCL 50 MG PO TABS
50.0000 mg | ORAL_TABLET | Freq: Four times a day (QID) | ORAL | Status: DC | PRN
  Administered 2012-03-26: 50 mg via ORAL
  Filled 2012-03-26: qty 1

## 2012-03-26 MED ORDER — HYDROMORPHONE HCL PF 1 MG/ML IJ SOLN
1.0000 mg | INTRAMUSCULAR | Status: DC | PRN
  Administered 2012-03-26 (×2): 2 mg via INTRAMUSCULAR
  Administered 2012-03-26: 1 mg via INTRAMUSCULAR
  Administered 2012-03-26 – 2012-03-27 (×5): 2 mg via INTRAMUSCULAR
  Administered 2012-03-27 (×3): 1 mg via INTRAMUSCULAR
  Administered 2012-03-28 (×4): 2 mg via INTRAMUSCULAR
  Filled 2012-03-26 (×3): qty 2
  Filled 2012-03-26: qty 1
  Filled 2012-03-26 (×6): qty 2
  Filled 2012-03-26: qty 1
  Filled 2012-03-26: qty 2
  Filled 2012-03-26: qty 1
  Filled 2012-03-26 (×2): qty 2
  Filled 2012-03-26: qty 1

## 2012-03-26 MED ORDER — LACOSAMIDE 50 MG PO TABS: 100.0000 mg | ORAL_TABLET | Freq: Two times a day (BID) | ORAL | Status: DC

## 2012-03-26 MED ORDER — TOPIRAMATE 25 MG PO TABS
25.0000 mg | ORAL_TABLET | Freq: Two times a day (BID) | ORAL | Status: DC
  Administered 2012-03-26 – 2012-03-29 (×8): 25 mg via ORAL
  Filled 2012-03-26 (×8): qty 1

## 2012-03-26 MED ORDER — MORPHINE SULFATE 2 MG/ML IJ SOLN
INTRAMUSCULAR | Status: AC
  Administered 2012-03-26: 1 mg via INTRAMUSCULAR
  Filled 2012-03-26: qty 1

## 2012-03-26 MED ORDER — MORPHINE SULFATE 2 MG/ML IJ SOLN
1.0000 mg | Freq: Four times a day (QID) | INTRAMUSCULAR | Status: DC | PRN
  Filled 2012-03-26: qty 1

## 2012-03-26 MED ORDER — ACETAMINOPHEN 650 MG RE SUPP: 650.0000 mg | Freq: Four times a day (QID) | RECTAL | Status: DC | PRN

## 2012-03-26 MED ORDER — MORPHINE SULFATE 2 MG/ML IJ SOLN: 1.0000 mg | Freq: Four times a day (QID) | INTRAMUSCULAR | Status: DC | PRN

## 2012-03-26 MED ORDER — PHENYTOIN SODIUM EXTENDED 100 MG PO CAPS
200.0000 mg | ORAL_CAPSULE | Freq: Two times a day (BID) | ORAL | Status: DC
  Administered 2012-03-26 – 2012-04-20 (×51): 200 mg via ORAL
  Filled 2012-03-26 (×54): qty 2

## 2012-03-26 MED ORDER — ACETAMINOPHEN 325 MG PO TABS: 650.0000 mg | ORAL_TABLET | Freq: Four times a day (QID) | ORAL | Status: DC | PRN

## 2012-03-26 MED ORDER — LORAZEPAM 2 MG/ML IJ SOLN
1.0000 mg | Freq: Once | INTRAMUSCULAR | Status: AC
  Administered 2012-03-26: 1 mg via INTRAVENOUS

## 2012-03-26 MED ORDER — ONDANSETRON HCL 4 MG PO TABS
4.0000 mg | ORAL_TABLET | Freq: Four times a day (QID) | ORAL | Status: DC | PRN
  Administered 2012-03-30: 4 mg via ORAL
  Filled 2012-03-26: qty 1

## 2012-03-26 MED ORDER — HYDROMORPHONE HCL PF 1 MG/ML IJ SOLN
1.0000 mg | Freq: Four times a day (QID) | INTRAMUSCULAR | Status: DC | PRN
  Administered 2012-03-26 (×2): 1 mg via INTRAMUSCULAR
  Filled 2012-03-26 (×2): qty 1

## 2012-03-26 MED ORDER — FOLIC ACID 1 MG PO TABS
1.0000 mg | ORAL_TABLET | Freq: Every day | ORAL | Status: DC
  Administered 2012-03-26 – 2012-04-20 (×21): 1 mg via ORAL
  Filled 2012-03-26 (×26): qty 1

## 2012-03-26 MED ORDER — PHENYTOIN SODIUM EXTENDED 100 MG PO CAPS: 200.0000 mg | ORAL_CAPSULE | Freq: Two times a day (BID) | ORAL | Status: DC

## 2012-03-26 MED ORDER — MORPHINE SULFATE 2 MG/ML IJ SOLN
2.0000 mg | Freq: Once | INTRAMUSCULAR | Status: AC
  Administered 2012-03-26: 2 mg via INTRAMUSCULAR

## 2012-03-26 MED ORDER — VITAMIN B-1 100 MG PO TABS: 100.0000 mg | ORAL_TABLET | Freq: Every day | ORAL | Status: DC

## 2012-03-26 MED ORDER — SODIUM CHLORIDE 0.9 % IJ SOLN: 3.0000 mL | Freq: Two times a day (BID) | INTRAMUSCULAR | Status: DC

## 2012-03-26 MED ORDER — ONDANSETRON HCL 4 MG/2ML IJ SOLN: 4.0000 mg | Freq: Four times a day (QID) | INTRAMUSCULAR | Status: DC | PRN

## 2012-03-26 MED ORDER — ACETAMINOPHEN 325 MG PO TABS
650.0000 mg | ORAL_TABLET | Freq: Four times a day (QID) | ORAL | Status: DC | PRN
  Administered 2012-03-29 – 2012-04-10 (×5): 650 mg via ORAL
  Filled 2012-03-26 (×6): qty 2

## 2012-03-26 MED ORDER — LAMOTRIGINE 25 MG PO TABS: 25.0000 mg | ORAL_TABLET | Freq: Two times a day (BID) | ORAL | Status: DC

## 2012-03-26 MED ORDER — RIVAROXABAN 15 MG PO TABS
15.0000 mg | ORAL_TABLET | Freq: Two times a day (BID) | ORAL | Status: DC
  Administered 2012-03-26 – 2012-04-07 (×25): 15 mg via ORAL
  Filled 2012-03-26 (×30): qty 1

## 2012-03-26 MED ORDER — VITAMIN B-1 100 MG PO TABS
100.0000 mg | ORAL_TABLET | Freq: Every day | ORAL | Status: DC
  Administered 2012-03-26 – 2012-04-20 (×21): 100 mg via ORAL
  Filled 2012-03-26 (×26): qty 1

## 2012-03-26 MED ORDER — RIVAROXABAN 15 MG PO TABS: 15.0000 mg | ORAL_TABLET | Freq: Two times a day (BID) | ORAL | Status: DC

## 2012-03-26 MED ORDER — TRAMADOL HCL 50 MG PO TABS: 50.0000 mg | ORAL_TABLET | Freq: Four times a day (QID) | ORAL | Status: DC | PRN

## 2012-03-26 MED ORDER — LEVETIRACETAM 750 MG PO TABS
2000.0000 mg | ORAL_TABLET | Freq: Two times a day (BID) | ORAL | Status: DC
  Administered 2012-03-26 – 2012-04-03 (×18): 2000 mg via ORAL
  Filled 2012-03-26 (×21): qty 1

## 2012-03-26 MED ORDER — NICOTINE POLACRILEX 2 MG MT GUM
2.0000 mg | CHEWING_GUM | OROMUCOSAL | Status: DC | PRN
  Administered 2012-03-26 – 2012-03-30 (×27): 2 mg via ORAL
  Administered 2012-03-30: 4 mg via ORAL
  Administered 2012-03-31 – 2012-04-20 (×130): 2 mg via ORAL
  Filled 2012-03-26 (×128): qty 1

## 2012-03-26 MED ORDER — LEVETIRACETAM 750 MG PO TABS: 2000.0000 mg | ORAL_TABLET | Freq: Two times a day (BID) | ORAL | Status: DC

## 2012-03-26 MED ORDER — LACOSAMIDE 50 MG PO TABS
200.0000 mg | ORAL_TABLET | Freq: Two times a day (BID) | ORAL | Status: DC
  Administered 2012-03-26 – 2012-03-28 (×6): 200 mg via ORAL
  Filled 2012-03-26 (×6): qty 4

## 2012-03-26 MED ORDER — ENOXAPARIN SODIUM 40 MG/0.4ML ~~LOC~~ SOLN: 40.0000 mg | SUBCUTANEOUS | Status: DC

## 2012-03-26 NOTE — Plan of Care (Signed)
Problem: Phase I Progression Outcomes Goal: Initial discharge plan identified Outcome: Completed/Met Date Met:  03/26/12 IVC papers completed- pt to return to [facility]

## 2012-03-26 NOTE — Progress Notes (Signed)
Offsite EEG completed. 

## 2012-03-26 NOTE — Progress Notes (Signed)
Subjective: Patient had multiple seizures this morning.  Recived 3mg  of Ativan.  Has had no further seizures since.  Has started his po medications.  Does not have IV access.    Objective: Current vital signs: BP 122/64  Pulse 76  Temp 97.6 F (36.4 C) (Oral)  Resp 17  Ht 5\' 8"  (1.727 m)  Wt 67.7 kg (149 lb 4 oz)  BMI 22.69 kg/m2  SpO2 97% Vital signs in last 24 hours: Temp:  [97.6 F (36.4 C)-98.9 F (37.2 C)] 97.6 F (36.4 C) (11/21 1200) Pulse Rate:  [72-101] 76  (11/21 1200) Resp:  [17-25] 17  (11/21 1200) BP: (100-122)/(50-88) 122/64 mmHg (11/21 0800) SpO2:  [94 %-100 %] 97 % (11/21 1200) Weight:  [67.7 kg (149 lb 4 oz)] 67.7 kg (149 lb 4 oz) (11/21 0130)  Intake/Output from previous day:   Intake/Output this shift: Total I/O In: 480 [P.O.:480] Out: -  Nutritional status: General  Neurologic Exam: Mental Status:  Patient is awake and oriented.  Follows three-step commands without difficulty.    Cranial Nerves:  II: Visual Fields are full. Pupils are equal, round, and reactive to light. Discs are difficult to visualize.  III,IV, VI: EOMI without ptosis or diploplia.  V: Facial sensation is symmetric to temperature  VII: Facial movement is symmetric.  VIII: hearing is intact to voice  X/XI: intact gag  XII: tongue is midline  Motor:  Tone is normal. Bulk is normal. 5/5 strength was present in all four extremities.  Sensory:  Sensation is symmetric to light touch and in the arms and legs.  Deep Tendon Reflexes:  2+ and symmetric Cerebellar:  Finger to nose and heel to shin intact bilaterally  Lab Results: Basic Metabolic Panel:  Lab 03/25/12 1610 03/20/12 1055  NA 140 138  K 4.8 4.1  CL 108 108  CO2 -- 20  GLUCOSE 109* 96  BUN 18 13  CREATININE 1.00 0.87  CALCIUM -- 9.0  MG -- --  PHOS -- --    Liver Function Tests: No results found for this basename: AST:5,ALT:5,ALKPHOS:5,BILITOT:5,PROT:5,ALBUMIN:5 in the last 168 hours No results found for  this basename: LIPASE:5,AMYLASE:5 in the last 168 hours No results found for this basename: AMMONIA:3 in the last 168 hours  CBC:  Lab 03/25/12 1649 03/20/12 1055  WBC -- 4.3  NEUTROABS -- 2.8  HGB 12.6* 9.0*  HCT 37.0* 33.2*  MCV -- 80.2  PLT -- 269    Cardiac Enzymes: No results found for this basename: CKTOTAL:5,CKMB:5,CKMBINDEX:5,TROPONINI:5 in the last 168 hours  Lipid Panel: No results found for this basename: CHOL:5,TRIG:5,HDL:5,CHOLHDL:5,VLDL:5,LDLCALC:5 in the last 168 hours  CBG:  Lab 03/25/12 1941 03/20/12 1337  GLUCAP 113* 107*    Microbiology: Results for orders placed during the hospital encounter of 03/07/12  MRSA PCR SCREENING     Status: Normal   Collection Time   03/08/12  1:36 AM      Component Value Range Status Comment   MRSA by PCR NEGATIVE  NEGATIVE Final   MRSA PCR SCREENING     Status: Normal   Collection Time   03/08/12  3:24 PM      Component Value Range Status Comment   MRSA by PCR NEGATIVE  NEGATIVE Final     Coagulation Studies: No results found for this basename: LABPROT:5,INR:5 in the last 72 hours  Imaging: Dg Mandible 4 Views  03/26/2012  *RADIOLOGY REPORT*  Clinical Data: Patient fell hitting right lower jaw  MANDIBLE - 4+ VIEW  Comparison: None.  Findings: By plain film, no mandibular fracture is seen.  The mandibular condyles appear to be in normal position.  There are lucencies within several teeth particularly the  molars consistent with dental caries.  The sinuses are clear.  The zygomatic arches are intact.  IMPRESSION: No maxillofacial fracture is seen by plain film.  Probable dental caries.   Original Report Authenticated By: Dwyane Dee, M.D.     Medications:  I have reviewed the patient's current medications. Scheduled:   . folic acid  1 mg Oral Daily  . lacosamide  200 mg Oral BID  . levETIRAcetam  2,000 mg Oral BID  . [COMPLETED] LORazepam  1 mg Intravenous Once  . [COMPLETED]  morphine injection  2 mg Intramuscular  Once  . [COMPLETED] morphine      . phenytoin  200 mg Oral BID  . Rivaroxaban  15 mg Oral BID  . thiamine  100 mg Oral Daily  . topiramate  25 mg Oral BID  . [DISCONTINUED] lacosamide  100 mg Oral BID  . [DISCONTINUED]  morphine injection  2 mg Intravenous Once    Assessment/Plan:  Patient Active Hospital Problem List:  Seizure disorder (03/07/2012)   Assessment: Patient currently stable.  Last seizure this morning.  It will take a few days for the patient to reach steady state with his medications since he is only able to receive them po.  As long as he can be held stable with prn Ativan this is acceptable. If seizures become more frequent, another option will need to be entertained.     Plan:  1.  Continue current medications.    2.  Continue seizure precautions    LOS: 0 days   Thana Farr, MD [facility] [phone] 03/26/2012  3:18 PM

## 2012-03-26 NOTE — Progress Notes (Addendum)
Clinical Social Work Department CLINICAL SOCIAL WORK PSYCHIATRY SERVICE LINE ASSESSMENT 03/26/2012  Patient:  [name] [name]  Account:  [id number]  Admit Date:  03/26/2012  Clinical Social Worker:  Doroteo Glassman  Date/Time:  03/26/2012 01:19 PM Referred by:  Physician  Date referred:  03/26/2012 Reason for Referral  Behavioral Health Issues   Presenting Symptoms/Problems (In the person's/family's own words):   Pt returned to [facility] from [facility] due to seizures    Abuse/Neglect/Trauma Comments:   Psychiatric History (check all that apply)  Inpatient/hospitilization   Psychiatric medications:  none   Current Mental Health Hospitalizations/Previous Mental Health History:   [facility]   Current provider:   Place and Date:   Current Medications:   Previous Impatient Admission/Date/Reason:   Pt was at [facility] at the beginning of 2013 and then [facility] due to suicide attempt via OD.    Pt was in [facility] tx in October for narcotic abuse.    Pt was admitted initially to [facility] 03/19/12 and then again on 03/23/12.   Emotional Health / Current Symptoms    Suicide/Self Harm  Suicide attempt in past (date/description)  Suicidal ideation (ex: "I can't take any more,I wish I could disappear")   Suicide attempt in the past:   Pt overdosed on 34 Xanax and held a gun to his head at the beginning of this year.    Pt was admitted to [facility] on 03/19/12 due to suicidal ideations.   Other harmful behavior:   Psychotic/Dissociative Symptoms  None reported   Other Psychotic/Dissociative Symptoms:    Attention/Behavioral Symptoms  Within Normal Limits   Other Attention / Behavioral Symptoms:    Cognitive Impairment  Within Normal Limits   Other Cognitive Impairment:    Mood and Adjustment  Guarded    Stress, Anxiety, Trauma, Any Recent Loss/Stressor  Grief/Loss (recent or history)   Anxiety (frequency):   Phobia (specify):   Compulsive behavior (specify):   Obsessive  behavior (specify):   Other:   [name] 57-year-old daughter and ex-girlfriend were killed in a MVA July 6th.   Substance Abuse/Use  Current substance use   SBIRT completed (please refer for detailed history):  N  Self-reported substance use:   Urinary Drug Screen Completed:  Y Alcohol level:    Environmental/Housing/Living Arrangement  With Family Member   Who is in the home:   unk   Emergency contact:  None given    Patient's Strengths and Goals (patient's own words):   Clinical Social Worker's Interpretive Summary:   Chart review:    Noted that Pt was admitted to [facility] hospital on 11/2 for seizures after being d/c'd, 1 week prior, from a [facility] tx facility for narcotic abuse.  He was in [place] visiting from [place].  During this admission at [facility], Pt reported drinking a large amount of alcohol on a daily basis.  He also informed the CSW that he attempted suicide at the beginning of this year via Xanax OD and holding a gun to his head.  He reported that he was admitted to [facility], then [facility] and then sent home for mental health f/u care.    While hospitalized at [facility], Pt threatened to leave AMA on at least one occasion due to the MD's refusal to give him Dilaudid.  Additionally, Pt would not provide hospital staff with Emergency Contact information.  Upon d/c, Pt would not provide staff with his sister's contact information even though she was supposed to be driving from [place] to pick  him up.  His sister never arrived, so Pt left the hospital on his own.  Pt d/c'd on 03/17/12.    Pt returned to [facility] on 03/18/12, presenting with SI and a plan to OD.   Pt was admitted to [facility] on 03/19/12.    He had a seizure on 03/20/12 and was admitted to [facility].    On 03/21/12, Pt threatened to leave AMA due to not being given Dilaudid for pain.    Pt was transferred back to [facility] on 03/23/12.    Pt was transferred back to [facility] on 03/25/12.    In the early hours of 03/26/12, Pt became  frustrated with his pain medication regimen and asked to leave AMA.  Pt was, subsequently, placed under IVC.    Throughout all of these varied hospitalizations, Pt had numerous seizures.    Met with Pt today to discuss current mental health.    Pt reports that his mental health "sucks" due to his physical health.  He states that he continues to have suicidal ideations and reports that it would be unsafe for him to d/c home.  He hopes to return to [facility] to finish out his tx.    Pt reports that he lives in [place] but it's "better, for now," for him to be in [place].  He didn't elaborate on this statement but did provide that he has friends in [place] and that some of the people that work for him live in [place].  CSW offered to search inpt tx facilities in [place].  Pt declined, stating that [facility] is where he'd like to be.    CSW ended the ax so that Pt could return to eating.    CSW thanked Pt for his time.    Conferred with psych MD.    Psych MD to see Pt again tomorrow.  Psych MD feels that inpt tx at [facility] may be warranted.    CSW to continue to follow.   Disposition:  Recommend Psych CSW continuing to support while in hospital  CSW to continue to follow.  Providence Crosby, LCSWA Clinical Social Work [phone]

## 2012-03-26 NOTE — Progress Notes (Signed)
Chaplain follow up with pt due to re-admission to [facility] and for continued support around grief.   Pt appreciative of chaplain visit.  Reported not feeling well at time of visit and requested chaplain follow up in AM.  Will follow pt during admission.  Please page as needs arise.    Belva Crome  MDiv, Chaplain

## 2012-03-26 NOTE — Progress Notes (Signed)
[id number]/Rhonda Davis, RN, BSN, CCM: CHART REVIEWED AND UPDATED.  Next chart review due on 11224013. NO DISCHARGE NEEDS PRESENT AT THIS TIME. CASE MANAGEMENT [phone] 

## 2012-03-26 NOTE — Progress Notes (Addendum)
03/26/12 0145- Nsg note: Pt arrived via stretcher from ED, he moved himself from stretcher to bed with no assistance. Pt started c/o 10/10 generalized pain on arrival. He stated he wanted morphine, then he said morphine gave him hives and that he wanted dilaudid. I called MD and he said pt could not get dilaudid, that he would order tramadol as he was ordered previous admission. Pt became irrate and jumped out of the bed and started ripping monitors and gown off, saying he was leaving AMA. Pt then told me he wants to take morphine and methadone. MD notified, IVC papers to be completed and morphine ordered. I gave pt morphine IM per order. 10 mins after dose- pt is asking for something stronger. I explained to him that we needed to wait atleast 30 mins to see results. I will continue to monitor pt. No obvious signs of distress. Sitter in room with pt.

## 2012-03-26 NOTE — Progress Notes (Addendum)
Subjective: Patient complaining of generalized body aches from seizures.  Was later notified by the nurse at around 930 patient had about 4 more episodes of seizure.  Refused lab draws.  Objective: Vital signs in last 24 hours: Filed Vitals:   03/26/12 0100 03/26/12 0130 03/26/12 0400  BP: 100/64  103/58  Pulse: 76  72  Temp: 98.9 F (37.2 C)    TempSrc: Oral    Resp: 20  23  Height:  5\' 8"  (1.727 m)   Weight:  67.7 kg (149 lb 4 oz)   SpO2: 100%  97%   Weight change:  No intake or output data in the 24 hours ending 03/26/12 0803  Physical Exam: General: Awake, Oriented, No acute distress. HEENT: EOMI, poor dentition, chipped right molar. Neck: Supple CV: S1 and S2 Lungs: Clear to ascultation bilaterally Abdomen: Soft, Nontender, Nondistended, +bowel sounds. Ext: Good pulses. Trace edema.  Lab Results: Basic Metabolic Panel:  Lab 03/25/12 9604 03/20/12 1055  NA 140 138  K 4.8 4.1  CL 108 108  CO2 -- 20  GLUCOSE 109* 96  BUN 18 13  CREATININE 1.00 0.87  CALCIUM -- 9.0  MG -- --  PHOS -- --   Liver Function Tests: No results found for this basename: AST:5,ALT:5,ALKPHOS:5,BILITOT:5,PROT:5,ALBUMIN:5 in the last 168 hours No results found for this basename: LIPASE:5,AMYLASE:5 in the last 168 hours No results found for this basename: AMMONIA:5 in the last 168 hours CBC:  Lab 03/25/12 1649 03/20/12 1055  WBC -- 4.3  NEUTROABS -- 2.8  HGB 12.6* 9.0*  HCT 37.0* 33.2*  MCV -- 80.2  PLT -- 269   Cardiac Enzymes: No results found for this basename: CKTOTAL:5,CKMB:5,CKMBINDEX:5,TROPONINI:5 in the last 168 hours BNP (last 3 results) No results found for this basename: PROBNP:3 in the last 8760 hours CBG:  Lab 03/25/12 1941 03/20/12 1337  GLUCAP 113* 107*   No results found for this basename: HGBA1C:5 in the last 72 hours Other Labs: No components found with this basename: POCBNP:3 No results found for this basename: DDIMER:2 in the last 168 hours No results  found for this basename: CHOL:2,HDL:2,LDLCALC:2,TRIG:2,CHOLHDL:2,LDLDIRECT:2 in the last 168 hours No results found for this basename: TSH,T4TOTAL,FREET3,T3FREE,FREET4,THYROIDAB in the last 168 hours No results found for this basename: VITAMINB12:2,FOLATE:2,FERRITIN:2,TIBC:2,IRON:2,RETICCTPCT:2 in the last 168 hours  Micro Results: No results found for this or any previous visit (from the past 240 hour(s)).  Studies/Results: No results found.  Medications: I have reviewed the patient's current medications. Scheduled Meds:   . lacosamide  200 mg Oral BID  . levETIRAcetam  2,000 mg Oral BID  . [COMPLETED]  morphine injection  2 mg Intramuscular Once  . [COMPLETED] morphine      . phenytoin  200 mg Oral BID  . Rivaroxaban  15 mg Oral BID  . thiamine  100 mg Oral Daily  . topiramate  25 mg Oral BID  . [DISCONTINUED] lacosamide  100 mg Oral BID  . [DISCONTINUED]  morphine injection  2 mg Intravenous Once   Continuous Infusions:  PRN Meds:.acetaminophen, acetaminophen, HYDROmorphone (DILAUDID) injection, LORazepam, ondansetron (ZOFRAN) IV, ondansetron, traMADol, [DISCONTINUED]  morphine injection  Assessment/Plan: Refractory status epilepticus, generalized convulsive Continue care in step down.  Continue seizure precautions.  Ativan 2 mg IM as needed for seizures. Patient seen by neurology, appreciate their recommendations.  EEG done with results pending, attempt to obtain outside EEG results.  As per neurology continue topiramate 25 mg twice daily, continue increased levetiracetam 2000 mg twice daily which is to  be decreased back to 1500 mg twice daily once therapeutic on topiramate.  Continue phenytoin at 200 mg twice daily, continue lacosamide 200 mg twice.  Suicide ideations/depression  Appreciate psych input, in patient transfer to [facility] once medically stable.  Corporate investment banker.  DVT of RUE/Protein C deficiency Continue xarelto 15mg  PO BID for 10 more days then 20 mg daily.  Discussed  with the patient about being on Lovenox as xarelto's efficacy is decreased by Dilantin.  Patient declined Lovenox, as a result continue lovenox. Patient has been tried on coumadin in the past and refuses blood drawn. Not a candidate.   Right molar toothache/chiped tooth? Likely due to seizure episode.  Orthopantogram machine broken by radiology, will get x-ray of the mandible.  Dr. Kristin Bruins consulted, appreciate input.  Headache Tylenol as needed.  Poor IV access In the past patient critical care as well as IR attempted to place central line without success and has refused PICC line. Discussed with IR, patient has DVT in the RUE and in the past could not thread in the LUE. IR recommended femoral line if patient needed it. Will try to get a line in patient's LE if possible.  History of alcohol abuse Continue thiamine and folic acid.  Prophylaxis On Xarelto.  Disposition Continue in stepdown as patient has had episodes of seizure today.   LOS: 0 days  Willeen Novak A, MD 03/26/2012, 8:03 AM

## 2012-03-26 NOTE — Progress Notes (Signed)
Nsg note: Pt called at 0300 asking for pain medicine for 10/10 generalized pain. NP called and received order for morphine 2mg  IMx1. Went in to give pt dose and pt was asleep. Pt called at 0330 and CN gave him the dose. Pt called at 0415 asking for pain medicine. I explained to him that it wasn't time for anymore and he threatened to leave. I explained to him that he would have to stay with [place] for his safety and that we would have to call security if he tried to leave. Pt kept asking if he could sign out AMA, I reiterated that he has to stay with [place] for now. No obvious signs of distress. Sitter in room. Will continue to monitor.

## 2012-03-26 NOTE — Consult Note (Signed)
Patient Identification:  [name] [name] Date of Evaluation:  03/26/2012 Reason for Consult:  Seizure disorder, depression  Referring Provider: Dr. Betti [name]  History of Present Illness:Pt is transferred from [facility] to [facility] due to recurrent seizures.  He has a congenital seizure disorder.  He claims it took time to get his complement of anti-seizure medications: Dilantin, VimPat, Topomax,  Kappra.  Pt believes that he began having seizures after anti-seizure medications had been restarted.  He says he has tonic-clonic seizures and it takes time for his body to completely relax.  Past Psychiatric History: Congenital seizure disorder, Medication seeking [asks for stimulant for ADHD], receives pain medication, drinks beer- 2-3 beers; denies tobacco, recreational drugs.   Past Medical History:     Past Medical History  Diagnosis Date  . Seizure   . Protein C deficiency   . Protein C deficiency        Past Surgical History  Procedure Date  . Insertion of vena cava filter     Allergies:  Allergies  Allergen Reactions  . Fish Allergy Anaphylaxis and Rash  . Depakote (Divalproex Sodium) Swelling  . Morphine And Related Hives    Pt tolerated IV morphine 03/25/2012 admission    Current Medications:  Prior to Admission medications   Medication Sig Start Date End Date Taking? Authorizing Provider  lacosamide 100 MG TABS Take 1 tablet (100 mg total) by mouth 2 (two) times daily. 03/16/12   [name] Ide, MD  levETIRAcetam (KEPPRA) 750 MG tablet Take 2 tablets (1,500 mg total) by mouth 2 (two) times daily. 03/16/12   [name] Ide, MD  phenytoin (DILANTIN) 100 MG ER capsule Take 2 capsules (200 mg total) by mouth 2 (two) times daily. 03/16/12   [name] Ide, MD  Rivaroxaban (XARELTO) 15 MG TABS tablet Take 1 tablet (15 mg total) by mouth 2 (two) times daily. Take 15 mg po daily for 20 days then  Continue to take 20 mg po daily 03/16/12   [name] Ide, MD  sulfamethoxazole-trimethoprim (BACTRIM DS)  800-160 MG per tablet Take 1 tablet by mouth every 12 (twelve) hours. 03/16/12   [name] Ide, MD  thiamine 100 MG tablet Take 1 tablet (100 mg total) by mouth daily. 03/16/12   [name] Ide, MD  traMADol (ULTRAM) 50 MG tablet Take 1 tablet (50 mg total) by mouth every 6 (six) hours as needed. 03/16/12   [name] Ide, MD    Social History:    reports that he has been smoking Cigarettes.  He has a 1.25 pack-year smoking history. He has never used smokeless tobacco. He reports that he drinks about 3.6 ounces of alcohol per week. He reports that he uses illicit drugs (Marijuana).   Family History:    History reviewed. No pertinent family history.   DIAGNOSIS:  Congenital Seizure Disorder; Pain medication addiction, Drug-seeking behavior History of non-adherence to medical treatments, diagnostics and management recommendations.   Assessment/Plan:  Discussed with Neurologist, Dr. Thad [name], and  Psych CSW Dr. Thad [name] knows this pt from [facility] admission[facility]s].  She says he has refused diagnostic tests and pulled out lines in the past in addition to requesting pain medications repeatedly.  She says he is not a candidate for more extreme efforts to control seizure activity because he is uncooperative.   His request for ADHD medication BEFORE he offers any history of childhood ADHD and treatment.  Of course, he claims to have taken Ritalin and Adderall as a child.  There is no  documentation of this diagnosis or prior use of Rxs for ADHD.  As before, he was told that he first needs to get seizures under control before those concerns may be entertained.  It is unlikely that this pt needs another controlled substance when he insists on so many doses of Dilaudid. Topic is deferred for now.  RECOMMENDATION:  1.  Pt continues to grieve over accidental death of fiance and daughter.  Consider transfer to [facility] or similar facility when stable.  2.  Corporate investment banker. 3.  Suggest follow specific scheduled time and does  of pain medication. Pt may need referral to pain clinic if not done aready.  4.  Will follow pt.  [name] Skinner MD 03/26/2012 12:30 PM

## 2012-03-26 NOTE — Progress Notes (Signed)
Pt refused to have labs drawn this morning- MD made aware.

## 2012-03-27 ENCOUNTER — Encounter (HOSPITAL_COMMUNITY): Payer: Self-pay | Admitting: Dentistry

## 2012-03-27 DIAGNOSIS — K029 Dental caries, unspecified: Secondary | ICD-10-CM

## 2012-03-27 DIAGNOSIS — K0401 Reversible pulpitis: Secondary | ICD-10-CM

## 2012-03-27 MED ORDER — CHLORHEXIDINE GLUCONATE 0.12 % MT SOLN
15.0000 mL | Freq: Four times a day (QID) | OROMUCOSAL | Status: DC
  Administered 2012-03-28 (×2): 15 mL via OROMUCOSAL
  Filled 2012-03-27 (×21): qty 15

## 2012-03-27 NOTE — Progress Notes (Signed)
Pt appeared tired today and seemed to have less energy than when CSW met with him yesterday.  Pt reported that he's still in a lot of dental pain and that he just doesn't feel well.  Pt gave CSW permission to send his information to other psych facilities, in the off chance that [facility] is not available as a d/c option.  CSW asked Pt about the SA tx he had prior to his 1st admission to [facility].  Pt denied SA tx but admitted to drinking heavily.  He denied that he had/has pxs with narcotics.  CSW thanked Pt for his time.  CSW to continue to follow.  Providence Crosby, LCSWA Clinical Social Work [phone]

## 2012-03-27 NOTE — Consult Note (Signed)
DENTAL CONSULTATION  Date of Consultation:  03/27/2012 Patient Name:   [name] [name] Date of Birth:   1981/01/11 Medical Record Number: [id number]  VITALS: BP 109/60  Pulse 59  Temp 97.3 F (36.3 C) (Oral)  Resp 15  Ht 5\' 8"  (1.727 m)  Wt 149 lb 14.6 oz (68 kg)  BMI 22.79 kg/m2  SpO2 97%   CHIEF COMPLAINT: Dental consultation requested to evaluate "chipped molar tooth"  HPI: [name] [name] is a 31 year old male with a history of seizure disorder that was uncontrolled secondary to medication noncompliance. Patient now being titrated to his current antiepileptic drug therapy regimen. Patient also has a history of recent behavioral health admission for suicidal ideation prior to transfer for medical care. Dental consultation requested for evaluation of " chipped molar tooth" secondary to trauma from clenching during the tonic-clonic seizures.    Patient gives a history of chipping part of his lower right molar tooth during a recent episode of tonic-clonic seizure activity. Patient points to tooth #32 as the offending tooth. Patient with a history of intermittent dental pain this time. Patient describes the pain as being sharp reaching the intensity of 8/10.  Currently it is 5/10. This pain has been occurring over the past several weeks by report.  Patient has not seen a dentist for" a long time".  Last Dentist seen was in [place], [place].  Patient Active Problem List  Diagnosis  . Status epilepticus, generalized convulsive  . Seizure disorder  . EtOH dependence  . Drug-seeking behavior  . Pain  . Cellulitis  . DVT (deep venous thrombosis)  . Major depression   PMH: Past Medical History  Diagnosis Date  . Seizure   . Protein C deficiency   . Protein C deficiency     PSH: Past Surgical History  Procedure Date  . Insertion of vena cava filter     ALLERGIES: Allergies  Allergen Reactions  . Fish Allergy Anaphylaxis and Rash  . Depakote (Divalproex  Sodium) Swelling  . Morphine And Related Hives    Pt tolerated IV morphine 03/25/2012 admission    MEDICATIONS: Current Facility-Administered Medications  Medication Dose Route Frequency Provider Last Rate Last Dose  . acetaminophen (TYLENOL) tablet 650 mg  650 mg Oral Q6H PRN Cristal Ford, MD       Or  . acetaminophen (TYLENOL) suppository 650 mg  650 mg Rectal Q6H PRN Cristal Ford, MD      . folic acid (FOLVITE) tablet 1 mg  1 mg Oral Daily Cristal Ford, MD   1 mg at 03/27/12 1027  . [EXPIRED] HYDROmorphone (DILAUDID) injection 1 mg  1 mg Intramuscular Once PRN Cristal Ford, MD      . HYDROmorphone (DILAUDID) injection 1-2 mg  1-2 mg Intramuscular Q4H PRN Cristal Ford, MD   2 mg at 03/27/12 0758  . lacosamide (VIMPAT) tablet 200 mg  200 mg Oral BID Ritta Slot, MD   200 mg at 03/27/12 1027  . levETIRAcetam (KEPPRA) tablet 2,000 mg  2,000 mg Oral BID Nishant Dhungel, MD   2,000 mg at 03/27/12 1025  . LORazepam (ATIVAN) injection 2 mg  2 mg Intramuscular PRN Nishant Dhungel, MD   2 mg at 03/27/12 0629  . nicotine polacrilex (NICORETTE) gum 2 mg  2 mg Oral PRN Cristal Ford, MD   2 mg at 03/27/12 4098  . ondansetron (ZOFRAN) tablet 4 mg  4 mg Oral Q6H PRN Cristal Ford, MD  Or  . ondansetron (ZOFRAN) injection 4 mg  4 mg Intravenous Q6H PRN Cristal Ford, MD      . phenytoin (DILANTIN) ER capsule 200 mg  200 mg Oral BID Nishant Dhungel, MD   200 mg at 03/27/12 1026  . Rivaroxaban (XARELTO) tablet TABS 15 mg  15 mg Oral BID Nishant Dhungel, MD   15 mg at 03/27/12 1027  . thiamine (VITAMIN B-1) tablet 100 mg  100 mg Oral Daily Nishant Dhungel, MD   100 mg at 03/27/12 1027  . topiramate (TOPAMAX) tablet 25 mg  25 mg Oral BID Nishant Dhungel, MD   25 mg at 03/27/12 1027  . [DISCONTINUED] HYDROmorphone (DILAUDID) injection 1 mg  1 mg Intramuscular Q6H PRN Cristal Ford, MD   1 mg at 03/26/12 0950  . [DISCONTINUED] traMADol (ULTRAM) tablet 50 mg  50 mg Oral Q6H PRN  Nishant Dhungel, MD   50 mg at 03/26/12 0941    LABS: Lab Results  Component Value Date   WBC 4.3 03/20/2012   HGB 12.6* 03/25/2012   HCT 37.0* 03/25/2012   MCV 80.2 03/20/2012   PLT 269 03/20/2012      Component Value Date/Time   NA 140 03/25/2012 1649   K 4.8 03/25/2012 1649   CL 108 03/25/2012 1649   CO2 20 03/20/2012 1055   GLUCOSE 109* 03/25/2012 1649   BUN 18 03/25/2012 1649   CREATININE 1.00 03/25/2012 1649   CALCIUM 9.0 03/20/2012 1055   GFRNONAA >90 03/20/2012 1055   GFRAA >90 03/20/2012 1055   Lab Results  Component Value Date   INR 1.32 03/08/2012   No results found for this basename: PTT    SOCIAL HISTORY: History   Social History  . Marital Status: Single    Spouse Name: N/A    Number of Children: N/A  . Years of Education: N/A   Occupational History  . Not on file.   Social History Main Topics  . Smoking status: Current Every Day Smoker -- 0.2 packs/day for 5 years    Types: Cigarettes  . Smokeless tobacco: Never Used  . Alcohol Use: 3.6 oz/week    6 Cans of beer per week  . Drug Use: Yes    Special: Marijuana  . Sexually Active: Yes   Other Topics Concern  . Not on file   Social History Narrative  . No narrative on file    FAMILY HISTORY: History reviewed. No pertinent family history.   REVIEW OF SYSTEMS: Reviewed from chart for this admission.  DENTAL HISTORY: CHIEF COMPLAINT: Dental consultation requested to evaluate "chipped molar tooth"  HPI: [name] [name] is a 31 year old male with a history of seizure disorder that was uncontrolled secondary to medication noncompliance. Patient now being titrated to his current antiepileptic drug therapy regimen. Patient also has a history of recent behavioral health admission for suicidal ideation prior to transfer for medical care. Dental consultation requested for evaluation of " chipped molar tooth" secondary to trauma from clenching during the tonic-clonic seizures.    Patient gives  a history of chipping part of his lower right molar tooth during a recent episode of tonic-clonic seizure activity. Patient points to tooth #32 as the offending tooth. Patient with a history of intermittent dental pain ay this time. Patient describes the pain as being sharp reaching the intensity of 8/10.  Currently it is 5/10. This pain has been occurring over the past several weeks by report.   Patient has not seen a dentist  for" a long time".  Last Dentist seen was in [place], [place] "about two years ago".  DENTAL EXAMINATION:  GENERAL:Patient is a well-developed, slightly built male in no acute distress. HEAD AND NECK: There is no palpable submandibular lymphadenopathy. The patient denies acute TMJ symptoms.  INTRAORAL EXAM: The patient has normal saliva. There is no evidence of intraoral abscess formation noted. DENTITION: The patient has an intact dentition. The third molars appear to be affected by dental caries, however. PERIODONTAL: The patient has chronic periodontitis with plaque and calculus accumulations, selective areas gingival recession, and no significant tooth mobility . DENTAL CARIES/SUBOPTIMAL RESTORATIONS: Extensive dental caries are noted to be affecting tooth numbers 1, 16, 17, 18, and 32.  A full series of dental radiographs would be needed to further identify the extent of the dental caries. ENDODONTIC: Patient with history of intermittent acute pulpitis symptoms coming from tooth #32 at this time. No discernible periapical pathology is noted on the Mandible Series. CROWN AND BRIDGE: No crown or bridge restorations are noted. PROSTHODONTIC: no dentures  OCCLUSION: Patient with a stable occlusion at this time.  RADIOGRAPHIC INTERPRETATION:  An orthopantogram was unable to be obtained by the department of radiology. A mandible series was obtained. Patient has an intact dentition. There multiple dental caries associated with tooth numbers 1, 16, 17, 18, and 32. No  obvious periapical radiolucencies are noted. There appears to be significant root curvature associated with the third molars at this time.   ASSESSMENTS: 1. Extensive dental caries associated with multiple teeth including tooth #32. 2. History of intermittent acute pulpitis symptoms involving tooth #32 3. Chronic periodontitis 4. Plaque and calculus accumulations 5. History of oral neglect 6. Need for referral to an oral surgeon due to the complexity of the anticipated dental extraction(s). 7. Possible need for adjustment of Xarelto to allow for dental extractions.   PLAN/RECOMMENDATIONS: 1. I discussed the risks, benefits, and complications of various treatment options with the patient in relationship to his medical and dental conditions. We discussed  the need for referral to an oral surgeon due to the potential complexity of the dental extractions at this time.  We discussed the need for followup dental exam, dental radiographs, and overall treatment planning for the patient once medically stable by the primary dentist of his choice. Patient currently would need to be evaluated by Dr. Florene Route (oral surgeon) as an outpatient and dental extraction(s) coordinated at that time.  In the meantime, the patient could be placed on oral clindamycin antibiotic therapy and chlorhexidine gluconate rinses until consultation and treatment is obtained. An appointment with Dr. Florene Route can be arranged by calling her office at [phone].  2. Discussion of findings with medical team and coordination of future medical and dental care.   Charlynne Pander, DDS

## 2012-03-27 NOTE — Progress Notes (Signed)
Subjective: Patient awake and alert.  Has had only one seizure today at about 0630.  Ativan was given at that time and has done well otherwise throughout the day.    Objective: Current vital signs: BP 109/60  Pulse 34  Temp 97.6 F (36.4 C) (Oral)  Resp 15  Ht 5\' 8"  (1.727 m)  Wt 68 kg (149 lb 14.6 oz)  BMI 22.79 kg/m2  SpO2 85% Vital signs in last 24 hours: Temp:  [97.3 F (36.3 C)-98.1 F (36.7 C)] 97.6 F (36.4 C) (11/22 1200) Pulse Rate:  [34-78] 34  (11/22 1200) Resp:  [14-16] 15  (11/22 1200) BP: (109-120)/(55-70) 109/60 mmHg (11/22 0400) SpO2:  [85 %-98 %] 85 % (11/22 1200) Weight:  [68 kg (149 lb 14.6 oz)] 68 kg (149 lb 14.6 oz) (11/22 0400)  Intake/Output from previous day: 11/21 0701 - 11/22 0700 In: 960 [P.O.:960] Out: 1250 [Urine:1250] Intake/Output this shift:   Nutritional status: General  Neurologic Exam: Mental Status:  Patient is awake and oriented. Follows three-step commands without difficulty.  Cranial Nerves:  II: Visual Fields are full. Pupils are equal, round, and reactive to light. Discs are difficult to visualize.  III,IV, VI: EOMI without ptosis or diploplia.  V: Facial sensation is symmetric to temperature  VII: Facial movement is symmetric.  VIII: hearing is intact to voice  X/XI: intact gag  XII: tongue is midline  Motor:  5/5 strength was present in all four extremities.  Sensory:  Sensation is symmetric to light touch and in the arms and legs.  Deep Tendon Reflexes:  2+ and symmetric  Cerebellar:  Finger to nose and heel to shin intact bilaterally   Lab Results: Basic Metabolic Panel:  Lab 03/25/12 1191  NA 140  K 4.8  CL 108  CO2 --  GLUCOSE 109*  BUN 18  CREATININE 1.00  CALCIUM --  MG --  PHOS --    Liver Function Tests: No results found for this basename: AST:5,ALT:5,ALKPHOS:5,BILITOT:5,PROT:5,ALBUMIN:5 in the last 168 hours No results found for this basename: LIPASE:5,AMYLASE:5 in the last 168 hours No  results found for this basename: AMMONIA:3 in the last 168 hours  CBC:  Lab 03/25/12 1649  WBC --  NEUTROABS --  HGB 12.6*  HCT 37.0*  MCV --  PLT --    Cardiac Enzymes: No results found for this basename: CKTOTAL:5,CKMB:5,CKMBINDEX:5,TROPONINI:5 in the last 168 hours  Lipid Panel: No results found for this basename: CHOL:5,TRIG:5,HDL:5,CHOLHDL:5,VLDL:5,LDLCALC:5 in the last 168 hours  CBG:  Lab 03/25/12 1941  GLUCAP 113*    Microbiology: Results for orders placed during the hospital encounter of 03/07/12  MRSA PCR SCREENING     Status: Normal   Collection Time   03/08/12  1:36 AM      Component Value Range Status Comment   MRSA by PCR NEGATIVE  NEGATIVE Final   MRSA PCR SCREENING     Status: Normal   Collection Time   03/08/12  3:24 PM      Component Value Range Status Comment   MRSA by PCR NEGATIVE  NEGATIVE Final     Coagulation Studies: No results found for this basename: LABPROT:5,INR:5 in the last 72 hours  Imaging: Dg Mandible 4 Views  03/26/2012  *RADIOLOGY REPORT*  Clinical Data: Patient fell hitting right lower jaw  MANDIBLE - 4+ VIEW  Comparison: None.  Findings: By plain film, no mandibular fracture is seen.  The mandibular condyles appear to be in normal position.  There are lucencies within several  teeth particularly the  molars consistent with dental caries.  The sinuses are clear.  The zygomatic arches are intact.  IMPRESSION: No maxillofacial fracture is seen by plain film.  Probable dental caries.   Original Report Authenticated By: Dwyane Dee, M.D.     Medications:  I have reviewed the patient's current medications. Scheduled:   . chlorhexidine  15 mL Mouth/Throat QID  . folic acid  1 mg Oral Daily  . lacosamide  200 mg Oral BID  . levETIRAcetam  2,000 mg Oral BID  . phenytoin  200 mg Oral BID  . Rivaroxaban  15 mg Oral BID  . thiamine  100 mg Oral Daily  . topiramate  25 mg Oral BID    Assessment/Plan:  Patient Active Hospital Problem  List: Seizure disorder (03/07/2012)   Assessment: Seizure frequency continues to improve.  Tolerating AED regimen.     Plan: Continue current medications    LOS: 1 day   Thana Farr, MD [facility] [phone] 03/27/2012  5:53 PM

## 2012-03-27 NOTE — Progress Notes (Signed)
Patient had seizure at 06:30 lasting 1 minute 30 seconds. Ativan administered as ordered. VS stable. Will continue to monitor.

## 2012-03-27 NOTE — Progress Notes (Addendum)
Subjective: No specific concerns.  Complained of tooth ache aggrevated by seizures. Lab unfortunately could not get labs drawn this morning.  Objective: Vital signs in last 24 hours: Filed Vitals:   03/26/12 2000 03/26/12 2339 03/27/12 0000 03/27/12 0400  BP: 120/70  112/55 109/60  Pulse: 77  78 59  Temp: 97.7 F (36.5 C) 98 F (36.7 C)  98.1 F (36.7 C)  TempSrc: Oral Oral  Oral  Resp: 14  14 15   Height:      Weight:    68 kg (149 lb 14.6 oz)  SpO2: 95%  98% 97%   Weight change: 0.3 kg (10.6 oz)  Intake/Output Summary (Last 24 hours) at 03/27/12 0748 Last data filed at 03/27/12 0615  Gross per 24 hour  Intake    960 ml  Output   1250 ml  Net   -290 ml    Physical Exam: General: Awake, Oriented, No acute distress. HEENT: EOMI, poor dentition, chipped right molar. Neck: Supple CV: S1 and S2 Lungs: Clear to ascultation bilaterally Abdomen: Soft, Nontender, Nondistended, +bowel sounds. Ext: Good pulses. Trace edema.  Lab Results: Basic Metabolic Panel:  Lab 03/25/12 4098 03/20/12 1055  NA 140 138  K 4.8 4.1  CL 108 108  CO2 -- 20  GLUCOSE 109* 96  BUN 18 13  CREATININE 1.00 0.87  CALCIUM -- 9.0  MG -- --  PHOS -- --   Liver Function Tests: No results found for this basename: AST:5,ALT:5,ALKPHOS:5,BILITOT:5,PROT:5,ALBUMIN:5 in the last 168 hours No results found for this basename: LIPASE:5,AMYLASE:5 in the last 168 hours No results found for this basename: AMMONIA:5 in the last 168 hours CBC:  Lab 03/25/12 1649 03/20/12 1055  WBC -- 4.3  NEUTROABS -- 2.8  HGB 12.6* 9.0*  HCT 37.0* 33.2*  MCV -- 80.2  PLT -- 269   Cardiac Enzymes: No results found for this basename: CKTOTAL:5,CKMB:5,CKMBINDEX:5,TROPONINI:5 in the last 168 hours BNP (last 3 results) No results found for this basename: PROBNP:3 in the last 8760 hours CBG:  Lab 03/25/12 1941 03/20/12 1337  GLUCAP 113* 107*   No results found for this basename: HGBA1C:5 in the last 72 hours Other  Labs: No components found with this basename: POCBNP:3 No results found for this basename: DDIMER:2 in the last 168 hours No results found for this basename: CHOL:2,HDL:2,LDLCALC:2,TRIG:2,CHOLHDL:2,LDLDIRECT:2 in the last 168 hours No results found for this basename: TSH,T4TOTAL,FREET3,T3FREE,FREET4,THYROIDAB in the last 168 hours No results found for this basename: VITAMINB12:2,FOLATE:2,FERRITIN:2,TIBC:2,IRON:2,RETICCTPCT:2 in the last 168 hours  Micro Results: No results found for this or any previous visit (from the past 240 hour(s)).  Studies/Results: Dg Mandible 4 Views  03/26/2012  *RADIOLOGY REPORT*  Clinical Data: Patient fell hitting right lower jaw  MANDIBLE - 4+ VIEW  Comparison: None.  Findings: By plain film, no mandibular fracture is seen.  The mandibular condyles appear to be in normal position.  There are lucencies within several teeth particularly the  molars consistent with dental caries.  The sinuses are clear.  The zygomatic arches are intact.  IMPRESSION: No maxillofacial fracture is seen by plain film.  Probable dental caries.   Original Report Authenticated By: Dwyane Dee, M.D.     Medications: I have reviewed the patient's current medications. Scheduled Meds:    . folic acid  1 mg Oral Daily  . lacosamide  200 mg Oral BID  . levETIRAcetam  2,000 mg Oral BID  . [COMPLETED] LORazepam  1 mg Intravenous Once  . phenytoin  200 mg Oral  BID  . Rivaroxaban  15 mg Oral BID  . thiamine  100 mg Oral Daily  . topiramate  25 mg Oral BID   Continuous Infusions:  PRN Meds:.acetaminophen, acetaminophen, [EXPIRED]  HYDROmorphone (DILAUDID) injection, HYDROmorphone (DILAUDID) injection, LORazepam, nicotine polacrilex, ondansetron (ZOFRAN) IV, ondansetron, [DISCONTINUED]  HYDROmorphone (DILAUDID) injection, [DISCONTINUED]  morphine injection, [DISCONTINUED] traMADol  Assessment/Plan: Refractory status epilepticus, generalized convulsive Continue seizure precautions.  Ativan 2  mg IM as needed for seizures. Neurology following.  EEG done with results pending, attempt to obtain outside EEG results.  Continue topiramate 25 mg twice daily. Continue increased levetiracetam 2000 mg twice daily which is to be decreased back to 1500 mg twice daily once therapeutic on topiramate.  Continue phenytoin at 200 mg twice daily, continue lacosamide 200 mg twice.  Tramadol discontinued as it lower seizures threshold.  Suicide ideations/depression  Appreciate psych input, in patient transfer to [facility] once medically stable.  Corporate investment banker.  DVT of RUE/Protein C deficiency Continue xarelto 15mg  PO BID for 9 more days then 20 mg daily.  Discussed with the patient about being on Lovenox as xarelto's efficacy is decreased by Dilantin on 03/27/2012, risk and benefits were discussed with the patient.  Patient declined Lovenox, as a result continue lovenox. Patient has been tried on coumadin in the past and refuses blood drawn. Not a candidate.   Right molar toothache/chiped tooth? Likely due to seizure episode.  Orthopantogram machine broken as per radiology, x-ray of the mandible shows dental caries.  Dr. Kristin Bruins consulted, appreciate input, evaluation still pending.  Headache Tylenol as needed.  Poor IV access In the past pulmonary critical care as well as IR attempted to place central line without success. Discussed with IR on 03/26/2012, patient has DVT in the RUE and in the past could not thread in the LUE. IR recommended femoral line if patient needed it. Hold off on IV access, if patient has any further episodes of seizures will try to get access in LE for delivering IV ativan to break his seizures faster.  History of alcohol abuse Continue thiamine and folic acid.  Prophylaxis On Xarelto.  Disposition Depending on how the patient does may transfer the patient out of stepdown.   LOS: 1 day  Kamin Niblack A, MD 03/27/2012, 7:48 AM  Addendum: Discussed with Dr. Kristin Bruins, who  recommended patient be evaluated by Dr. Florene Route as outpatient for possible dental extraction for dental caries after transfer to behavioral health.  Also discussed with Dr. Kristin Bruins, given patient recently completed course of antibiotics for cellulitis, if the patient has any further dental pain could consider starting the patient on clindamycin.  Start patient on chlorhexidine rinses. Appointment with Dr. Florene Route can be arranged at [phone].  Burnice Oestreicher A, MD 03/27/2012, 4:51 PM

## 2012-03-28 DIAGNOSIS — F329 Major depressive disorder, single episode, unspecified: Secondary | ICD-10-CM

## 2012-03-28 DIAGNOSIS — G40909 Epilepsy, unspecified, not intractable, without status epilepticus: Secondary | ICD-10-CM

## 2012-03-28 DIAGNOSIS — I82409 Acute embolism and thrombosis of unspecified deep veins of unspecified lower extremity: Secondary | ICD-10-CM

## 2012-03-28 MED ORDER — HYDROMORPHONE HCL PF 1 MG/ML IJ SOLN
1.0000 mg | INTRAMUSCULAR | Status: DC | PRN
  Administered 2012-03-28 – 2012-03-29 (×4): 1 mg via INTRAMUSCULAR
  Filled 2012-03-28 (×4): qty 1

## 2012-03-28 NOTE — Consult Note (Signed)
Patient Identification:  [name] [name] Date of Evaluation:  03/28/2012 Reason for Consult:  Seizure disorder, depression  Referring Provider: Dr. Betti Cruz  Interval Hx;  :Pt is transferred from [facility] to [facility] due to recurrent seizures.  Reports few (3) sz today. Mood better today. Concerned about sz now. Wants [facility] after med clearance. No si today.     Past Psychiatric History: Congenital seizure disorder, Medication seeking [asks for stimulant for ADHD], receives pain medication, drinks beer- 2-3 beers; denies tobacco, recreational drugs.   Past Medical History:     Past Medical History  Diagnosis Date  . Seizure   . Protein C deficiency   . Protein C deficiency        Past Surgical History  Procedure Date  . Insertion of vena cava filter     Allergies:  Allergies  Allergen Reactions  . Fish Allergy Anaphylaxis and Rash  . Depakote (Divalproex Sodium) Swelling  . Morphine And Related Hives    Pt tolerated IV morphine 03/25/2012 admission    Current Medications:  Prior to Admission medications   Medication Sig Start Date End Date Taking? Authorizing Provider  lacosamide 100 MG TABS Take 1 tablet (100 mg total) by mouth 2 (two) times daily. 03/16/12   Meredeth Ide, MD  levETIRAcetam (KEPPRA) 750 MG tablet Take 2 tablets (1,500 mg total) by mouth 2 (two) times daily. 03/16/12   Meredeth Ide, MD  phenytoin (DILANTIN) 100 MG ER capsule Take 2 capsules (200 mg total) by mouth 2 (two) times daily. 03/16/12   Meredeth Ide, MD  Rivaroxaban (XARELTO) 15 MG TABS tablet Take 1 tablet (15 mg total) by mouth 2 (two) times daily. Take 15 mg po daily for 20 days then  Continue to take 20 mg po daily 03/16/12   Meredeth Ide, MD  sulfamethoxazole-trimethoprim (BACTRIM DS) 800-160 MG per tablet Take 1 tablet by mouth every 12 (twelve) hours. 03/16/12   Meredeth Ide, MD  thiamine 100 MG tablet Take 1 tablet (100 mg total) by mouth daily. 03/16/12   Meredeth Ide, MD  traMADol (ULTRAM) 50 MG  tablet Take 1 tablet (50 mg total) by mouth every 6 (six) hours as needed. 03/16/12   Meredeth Ide, MD    Social History:    reports that he has been smoking Cigarettes.  He has a 1.25 pack-year smoking history. He has never used smokeless tobacco. He reports that he drinks about 3.6 ounces of alcohol per week. He reports that he uses illicit drugs (Marijuana).   Family History:    History reviewed. No pertinent family history.     Mental Status Examination/Evaluation:  Appearance: on bed  Eye Contact:: Good  Speech: normal  Volume: Normal  Mood: depressed  Affect: ristricted  Thought Process: organized  Orientation: Full  Thought Content: NO AVH  Suicidal Thoughts: No  Homicidal Thoughts: no  Memory: fair  Judgement: Impaired  Insight: Lacking  Psychomotor Activity: Normal  Concentration: Fair  Recall: Fair  Akathisia: No DIAGNOSIS:  Congenital Seizure Disorder; Pain medication addiction, Drug-seeking behavior History of non-adherence to medical treatments, diagnostics and management recommendations.    RECOMMENDATION:    1.  Pt continues to grieve over accidental death of fiance and daughter.  Consider transfer to [facility] or similar facility when medically stable.   2.  Corporate investment banker.   4.  Will follow pt. As needed  Wonda Cerise MD 03/28/2012 10:02 PM      Review of Systems  HENT:  Negative.      Physical Exam

## 2012-03-28 NOTE — Progress Notes (Signed)
RN called to room by sitter. Pt was having a seizure. 2mg  of IM ativan administered. Pt quit seizing for approximately 2 minutes then began having another seizure. 2mg  of IM ativan adminstered at that time. Pt stopped seizing and was able to answer questions. Approximately 2 minutes later, the pt started seizing again. 2mg  of IM ativan administered again. A few minutes later, the pt came to and was alert and oriented x 4. Pt was administered a total of 6mg  of Ativan. Night coverage notified. No new orders received. VSS throughout this episode and pt remained in NSR on telemetry. Will continue to monitor.

## 2012-03-28 NOTE — Progress Notes (Signed)
[facility] PROGRESS NOTE  [name] [name] WUJ:[id number] DOB: 1980/10/21 DOA: 03/26/2012 PCP: [name] Oats, MD  Assessment/Plan: 1. Seizure disorder/refractory epilepsy--management per neurology--started on topiramate, titrate upwards over 6 weeks to 200mg  BID and at that point stop Vimpat. Serial phenytoin levels during this titration as topiramate can increase dilantin concentrations. Ativan 2mg  IV or IM prn seizure activity. Increase levitiracetam to 2000mg  BID, would decrease back to 1500mg  BID once therapeutic on topiramate. Continue phenytoin 200mg  BID, continue vimpat 200mg  BID  2. Major depressive disorder/SI--management per psychiatry. To [facility] when stable. 3. DVT RUE--Xarelto. Patient refuses lab draws, therefore not a warfarin candidate. 4. Difficult IV access--Per Dr. 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 Could not access a collateral vein in right neck. Attempt made to access left brachial vein above elbow. [Given DVT RUE] may have to use femoral vein in future for access." Unable to maintain PIV. Continue to monitor, if worsens, would likely need femoral line. 5. Extensive dental caries-- per Dr. Kristin [name], patient needs outpatient oral surgeon--Dr. Florene [name] as outpatient for possible dental extraction for dental caries after transfer to behavioral health. Appointment with Dr. Florene [name] can be arranged at [phone]. 6. Alcohol abuse  7. Protein C deficiency  8. Report of drug seeking behavior--currently receiving 2 mg Dilaudid Q4 PRN. Taper off Dilaudid.  Patient refusing lab draws.  Code Status: full code Family Communication: none present Disposition Plan: to [facility] when stable  [name] Sacks, MD  [facility] Team 5 Pager [phone]. If 7PM-7AM, please contact night-coverage at [email or web address], password [facility] 03/28/2012, 4:47 PM  LOS: 2 days   Brief narrative: 31 year old man admitted for refractory epilepsy from [facility] where he  was treated for depression, SI.  Consultants:  Neurology  Psychiatry   Procedures:    HPI/Subjective: Seizure early this AM (3 in quick succession) responded to IM Ativan. Complains of headache now.  Objective: Filed Vitals:   03/28/12 0300 03/28/12 0529 03/28/12 0621 03/28/12 1347  BP:  119/70  104/63  Pulse:  66  81  Temp:   97.5 F (36.4 C) 98.1 F (36.7 C)  TempSrc:   Oral Oral  Resp:    18  Height:      Weight: 70.3 kg (154 lb 15.7 oz)     SpO2:  100%  97%    Intake/Output Summary (Last 24 hours) at 03/28/12 1647 Last data filed at 03/28/12 0754  Gross per 24 hour  Intake    960 ml  Output      0 ml  Net    960 ml   Filed Weights   03/26/12 0130 03/27/12 0400 03/28/12 0300  Weight: 67.7 kg (149 lb 4 oz) 68 kg (149 lb 14.6 oz) 70.3 kg (154 lb 15.7 oz)    Exam: General:  Appears calm and comfortable. Sitter at bedside Cardiovascular: RRR, no m/r/g. No LE edema. Respiratory: CTA bilaterally, no w/r/r. Normal respiratory effort. Psychiatric: appears depressed, speech fluent and appropriate Neurologic: grossly non-focal.  Data Reviewed: Basic Metabolic Panel:  Lab 03/25/12 9629  NA 140  K 4.8  CL 108  CO2 --  GLUCOSE 109*  BUN 18  CREATININE 1.00  CALCIUM --  MG --  PHOS --   CBC:  Lab 03/25/12 1649  WBC --  NEUTROABS --  HGB 12.6*  HCT 37.0*  MCV --  PLT --   CBG:  Lab 03/25/12 1941  GLUCAP 113*    Studies: No results found.  Scheduled Meds:   . chlorhexidine  15 mL Mouth/Throat QID  . folic acid  1 mg Oral Daily  . lacosamide  200 mg Oral BID  . levETIRAcetam  2,000 mg Oral BID  . phenytoin  200 mg Oral BID  . Rivaroxaban  15 mg Oral BID  . thiamine  100 mg Oral Daily  . topiramate  25 mg Oral BID   Continuous Infusions:   Principal Problem:  *Seizure disorder Active Problems:  Status epilepticus, generalized convulsive  EtOH dependence  DVT (deep venous thrombosis)  Major depression     [name] Sacks,  MD  [facility] Team 5 Pager [phone]. If 8PM-8AM, please contact night-coverage at [email or web address], password [facility] 03/28/2012, 4:47 PM  LOS: 2 days   Time spent: 35 minutes

## 2012-03-28 NOTE — Progress Notes (Signed)
Pt refused AM labs. Night coverage aware.

## 2012-03-29 MED ORDER — HYDROMORPHONE HCL PF 1 MG/ML IJ SOLN
0.5000 mg | INTRAMUSCULAR | Status: DC | PRN
  Administered 2012-03-29: 0.5 mg via INTRAMUSCULAR
  Filled 2012-03-29: qty 1

## 2012-03-29 MED ORDER — MORPHINE SULFATE 15 MG PO TABS: 15.0000 mg | ORAL_TABLET | Freq: Once | ORAL | Status: DC

## 2012-03-29 MED ORDER — LACOSAMIDE 200 MG PO TABS
300.0000 mg | ORAL_TABLET | Freq: Two times a day (BID) | ORAL | Status: DC
  Administered 2012-03-29 – 2012-04-03 (×11): 300 mg via ORAL
  Filled 2012-03-29: qty 2
  Filled 2012-03-29: qty 1
  Filled 2012-03-29: qty 6
  Filled 2012-03-29: qty 2
  Filled 2012-03-29 (×2): qty 6
  Filled 2012-03-29: qty 1
  Filled 2012-03-29: qty 6
  Filled 2012-03-29: qty 1
  Filled 2012-03-29 (×3): qty 6
  Filled 2012-03-29: qty 1
  Filled 2012-03-29: qty 6

## 2012-03-29 MED ORDER — MORPHINE SULFATE 15 MG PO TABS
15.0000 mg | ORAL_TABLET | ORAL | Status: DC | PRN
  Administered 2012-03-29: 15 mg via ORAL
  Filled 2012-03-29: qty 1

## 2012-03-29 MED ORDER — DOCUSATE SODIUM 100 MG PO CAPS
100.0000 mg | ORAL_CAPSULE | Freq: Two times a day (BID) | ORAL | Status: DC
  Administered 2012-03-29 – 2012-04-20 (×24): 100 mg via ORAL
  Filled 2012-03-29 (×37): qty 1

## 2012-03-29 MED ORDER — MORPHINE SULFATE 30 MG PO TABS
30.0000 mg | ORAL_TABLET | Freq: Once | ORAL | Status: AC
  Administered 2012-03-30: 30 mg via ORAL
  Filled 2012-03-29: qty 1

## 2012-03-29 MED ORDER — MORPHINE SULFATE 30 MG PO TABS
30.0000 mg | ORAL_TABLET | Freq: Once | ORAL | Status: AC
  Administered 2012-03-29: 30 mg via ORAL
  Filled 2012-03-29: qty 1

## 2012-03-29 MED ORDER — TOPIRAMATE 25 MG PO TABS
50.0000 mg | ORAL_TABLET | Freq: Two times a day (BID) | ORAL | Status: DC
  Administered 2012-03-30 – 2012-04-02 (×8): 50 mg via ORAL
  Filled 2012-03-29 (×11): qty 2

## 2012-03-29 MED ORDER — HYDROMORPHONE HCL 2 MG PO TABS: 1.0000 mg | ORAL_TABLET | ORAL | Status: DC | PRN

## 2012-03-29 NOTE — Progress Notes (Signed)
Pt denies Morphine PO allergy, states reaction limited to local itchiness with IM Morphine.

## 2012-03-29 NOTE — Consult Note (Signed)
Patient Identification:  [name] [name] Date of Evaluation:  03/29/2012 Reason for Consult:  Seizure disorder, depression  Referring Provider: Dr. Betti Cruz  Interval Hx;  :Pt is transferred from [facility] to [facility] due to recurrent seizures.  Reports 1 sz today. More sleepy today. Per nursing tech he also has tounge bite today during sz. Waiting for neurology today.     Past Psychiatric History: Congenital seizure disorder, Medication seeking [asks for stimulant for ADHD], receives pain medication, drinks beer- 2-3 beers; denies tobacco, recreational drugs.   Past Medical History:     Past Medical History  Diagnosis Date  . Seizure   . Protein C deficiency   . Protein C deficiency        Past Surgical History  Procedure Date  . Insertion of vena cava filter     Allergies:  Allergies  Allergen Reactions  . Fish Allergy Anaphylaxis and Rash  . Depakote (Divalproex Sodium) Swelling  . Morphine And Related Hives    Pt tolerated IV morphine 03/25/2012 admission    Current Medications:  Prior to Admission medications   Medication Sig Start Date End Date Taking? Authorizing Provider  lacosamide 100 MG TABS Take 1 tablet (100 mg total) by mouth 2 (two) times daily. 03/16/12   Meredeth Ide, MD  levETIRAcetam (KEPPRA) 750 MG tablet Take 2 tablets (1,500 mg total) by mouth 2 (two) times daily. 03/16/12   Meredeth Ide, MD  phenytoin (DILANTIN) 100 MG ER capsule Take 2 capsules (200 mg total) by mouth 2 (two) times daily. 03/16/12   Meredeth Ide, MD  Rivaroxaban (XARELTO) 15 MG TABS tablet Take 1 tablet (15 mg total) by mouth 2 (two) times daily. Take 15 mg po daily for 20 days then  Continue to take 20 mg po daily 03/16/12   Meredeth Ide, MD  sulfamethoxazole-trimethoprim (BACTRIM DS) 800-160 MG per tablet Take 1 tablet by mouth every 12 (twelve) hours. 03/16/12   Meredeth Ide, MD  thiamine 100 MG tablet Take 1 tablet (100 mg total) by mouth daily. 03/16/12   Meredeth Ide, MD  traMADol  (ULTRAM) 50 MG tablet Take 1 tablet (50 mg total) by mouth every 6 (six) hours as needed. 03/16/12   Meredeth Ide, MD    Social History:    reports that he has been smoking Cigarettes.  He has a 1.25 pack-year smoking history. He has never used smokeless tobacco. He reports that he drinks about 3.6 ounces of alcohol per week. He reports that he uses illicit drugs (Marijuana).   Family History:    History reviewed. No pertinent family history.     Mental Status Examination/Evaluation:  Appearance: on bed  Eye Contact:: Good  Speech: normal  Volume: Normal  Mood: depressed  Affect: ristricted  Thought Process: organized  Orientation: Full  Thought Content: NO AVH  Suicidal Thoughts: No  Homicidal Thoughts: no  Memory: fair  Judgement: Impaired  Insight: Lacking  Psychomotor Activity: Normal  Concentration: Fair  Recall: Fair  Akathisia: No DIAGNOSIS:  Congenital Seizure Disorder; Pain medication addiction, Drug-seeking behavior History of non-adherence to medical treatments, diagnostics and management recommendations.    RECOMMENDATION:       1.  Will follow pt. As needed  Wonda Cerise MD 03/29/2012 8:41 PM      Review of Systems  HENT: Negative.      Physical Exam

## 2012-03-29 NOTE — Progress Notes (Signed)
Transferred patient via bed to room 1227 stepdown.  Patient is lethargic but responds appropriately to questions.  Left in care of ICU/SD nurses.

## 2012-03-29 NOTE — Progress Notes (Signed)
Pt continues complain of severe pain 8/10 after 17:00 PO Morphine 15 mg (first dose this day) and Tylenol 650 mg. Discussed with MD and with pt and reiterated that priority is controlling Seizures (8 in last 24 hours) while balancing with pain control and the need to give PO Morphine a couple of doses to determine effectiveness of dose in combination with Tylenol. Md stated, and was relayed to pt, that dosage can still be adjusted during the night if necessary and that MD will try to confirm with outpt MD home PO Morphine dosage (pt requesting 60 mg Morphine PO). Pt remains frustrated with perceived lack of desire to treat pain and is focused solely  On receiving pain medication, states not worried about the seizures.

## 2012-03-29 NOTE — Progress Notes (Signed)
Patient began seizure at 1418 2 mg ativan IM, tremors continued, administered ativan 2 mg IM again.  Dr. Irene Limbo aware that patient has no other IV access.

## 2012-03-29 NOTE — Progress Notes (Signed)
Subjective: Patient reports that he is in a bad mood.  Had quite a few seizures this morning but has done will throughout the rest of the day.  Required two administrations of Ativan.    Objective: Current vital signs: BP 107/63  Pulse 76  Temp 99.7 F (37.6 C) (Oral)  Resp 18  Ht 5\' 8"  (1.727 m)  Wt 70.2 kg (154 lb 12.2 oz)  BMI 23.53 kg/m2  SpO2 100% Vital signs in last 24 hours: Temp:  [97.8 F (36.6 C)-99.7 F (37.6 C)] 99.7 F (37.6 C) (11/24 2000) Pulse Rate:  [60-97] 76  (11/24 1630) Resp:  [16-18] 18  (11/24 1630) BP: (95-119)/(60-78) 107/63 mmHg (11/24 1630) SpO2:  [95 %-100 %] 100 % (11/24 1630) Weight:  [70.2 kg (154 lb 12.2 oz)] 70.2 kg (154 lb 12.2 oz) (11/24 0500)  Intake/Output from previous day: 11/23 0701 - 11/24 0700 In: 960 [P.O.:960] Out: 380 [Urine:380] Intake/Output this shift:   Nutritional status: General  Neurologic Exam: Mental Status:  Patient is awake and oriented. Follows three-step commands without difficulty.  Cranial Nerves:  II: Visual Fields are full. Pupils are equal, round, and reactive to light. Discs are difficult to visualize.  III,IV, VI: EOMI without ptosis or diploplia.  V: Facial sensation is symmetric to temperature  VII: Facial movement is symmetric.  VIII: hearing is intact to voice  X/XI: intact gag  XII: tongue is midline  Motor:  5/5 strength was present in all four extremities.  Sensory:  Sensation is symmetric to light touch and in the arms and legs.  Deep Tendon Reflexes:  2+ and symmetric  Cerebellar:  Finger to nose and heel to shin intact bilaterally    Lab Results: Basic Metabolic Panel:  Lab 03/25/12 1610  NA 140  K 4.8  CL 108  CO2 --  GLUCOSE 109*  BUN 18  CREATININE 1.00  CALCIUM --  MG --  PHOS --    Liver Function Tests: No results found for this basename: AST:5,ALT:5,ALKPHOS:5,BILITOT:5,PROT:5,ALBUMIN:5 in the last 168 hours No results found for this basename: LIPASE:5,AMYLASE:5 in  the last 168 hours No results found for this basename: AMMONIA:3 in the last 168 hours  CBC:  Lab 03/25/12 1649  WBC --  NEUTROABS --  HGB 12.6*  HCT 37.0*  MCV --  PLT --    Cardiac Enzymes: No results found for this basename: CKTOTAL:5,CKMB:5,CKMBINDEX:5,TROPONINI:5 in the last 168 hours  Lipid Panel: No results found for this basename: CHOL:5,TRIG:5,HDL:5,CHOLHDL:5,VLDL:5,LDLCALC:5 in the last 168 hours  CBG:  Lab 03/25/12 1941  GLUCAP 113*    Microbiology: Results for orders placed during the hospital encounter of 03/07/12  MRSA PCR SCREENING     Status: Normal   Collection Time   03/08/12  1:36 AM      Component Value Range Status Comment   MRSA by PCR NEGATIVE  NEGATIVE Final   MRSA PCR SCREENING     Status: Normal   Collection Time   03/08/12  3:24 PM      Component Value Range Status Comment   MRSA by PCR NEGATIVE  NEGATIVE Final     Coagulation Studies: No results found for this basename: LABPROT:5,INR:5 in the last 72 hours  Imaging: No results found.  Medications:  I have reviewed the patient's current medications. Scheduled:   . chlorhexidine  15 mL Mouth/Throat QID  . docusate sodium  100 mg Oral BID  . folic acid  1 mg Oral Daily  . lacosamide  300 mg  Oral BID  . levETIRAcetam  2,000 mg Oral BID  . morphine  30 mg Oral Once  . phenytoin  200 mg Oral BID  . Rivaroxaban  15 mg Oral BID  . thiamine  100 mg Oral Daily  . topiramate  50 mg Oral BID  . [DISCONTINUED] lacosamide  200 mg Oral BID  . [DISCONTINUED] morphine  15 mg Oral Once  . [DISCONTINUED] topiramate  25 mg Oral BID    Assessment/Plan:  Patient Active Hospital Problem List: Seizure disorder (03/07/2012)   Assessment: Continues to have intermittent seizures on multiple anticonvulsants.  Should be at steady state at this point.  Topamax has been on board for 4 days.   Plan:  1.  Will increase Topamax to 50mg  BID to start in AM.     LOS: 3 days   Thana Farr, MD [facility] [phone] 03/29/2012  9:19 PM

## 2012-03-29 NOTE — Progress Notes (Signed)
Sitter informed RN that patient was having a seizure. Patient positioned on side and head protected. Vital signs stable. Patient placed on O2 at 3 liters. Patient received Ativan 2 mg IM. Patient proceeded to have another seizure and was administered 2 more mg(s) of Ativan. On call MD made aware that pt was having seizures and had received a total of 6 mg of Ativan. MD stated it was okay to give 2 more mg(s). At this time patient started to seize again and was given 2 mg for a total of 8 mg of Ativan. Patient then came to and was c/o a headache. MD up to floor to assess patient and no new orders was given. Sitter at bedside will continue to monitor patient.

## 2012-03-29 NOTE — Progress Notes (Signed)
[facility] PROGRESS NOTE  [name] [name] ZOX:[id number] DOB: 1981/03/14 DOA: 03/26/2012 PCP: Sheila Oats, MD  Assessment/Plan: 1. Seizure disorder/refractory epilepsy--further seizures last night and this AM. Management per neurology--started on topiramate, titrate upwards over 6 weeks to 200mg  BID and at that point stop Vimpat. Serial phenytoin levels during this titration as topiramate can increase dilantin concentrations. Ativan 2mg  IM prn seizure activity. IncreaseD levitiracetam to 2000mg  BID, would decrease back to 1500mg  BID once therapeutic on topiramate. Continue phenytoin 200mg  BID, continue vimpat 200mg  BID  2. Major depressive disorder/SI--management per psychiatry. To [facility] when stable. 3. DVT RUE--Xarelto. Patient refuses lab draws, therefore not a warfarin candidate. 4. Difficult IV access--Per Dr. 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 Could not access a collateral vein in right neck. Attempt made to access left brachial vein above elbow. [Given DVT RUE] may have to use femoral vein in future for access." Unable to maintain PIV. Continue to monitor, if worsens, would likely need femoral line. 5. Extensive dental caries-- per Dr. Kristin Bruins, patient needs outpatient oral surgeon--Dr. Florene Route as outpatient for possible dental extraction for dental caries after transfer to behavioral health. Appointment with Dr. Florene Route can be arranged at [phone]. 6. Alcohol abuse  7. Protein C deficiency  8. Report of drug seeking behavior--currently receiving 2 mg Dilaudid Q4 PRN. Taper off Dilaudid.  Seizure frequency increased. Discussed with Dr. Maris Berger immediate recommendations, she will follow-up later today. Wean hydromorphone again.   Code Status: full code Family Communication: none present Disposition Plan: to [facility] when stable  Brendia Sacks, MD  [facility] Team 5 Pager [phone]. If 7PM-7AM, please contact night-coverage  at [email or web address], password [facility] 03/29/2012, 10:32 AM  LOS: 3 days   Brief narrative: 31 year old man admitted for refractory epilepsy from [facility] where he was treated for depression, SI.  Consultants:  Neurology  Psychiatry   Procedures:    HPI/Subjective: 5 seizures last night (8 mg IM Ativan). One seizure this AM, witnessed by myself. Complains of HA, tooth pain.  Objective: Filed Vitals:   03/28/12 2340 03/28/12 2355 03/29/12 0500 03/29/12 0544  BP: 119/72 115/78  95/60  Pulse: 65 76  63  Temp:  98.4 F (36.9 C)  97.8 F (36.6 C)  TempSrc:  Axillary  Oral  Resp: 18 18  16   Height:      Weight:   70.2 kg (154 lb 12.2 oz)   SpO2: 100% 100%  95%    Intake/Output Summary (Last 24 hours) at 03/29/12 1032 Last data filed at 03/29/12 0500  Gross per 24 hour  Intake    480 ml  Output    380 ml  Net    100 ml   Filed Weights   03/27/12 0400 03/28/12 0300 03/29/12 0500  Weight: 68 kg (149 lb 14.6 oz) 70.3 kg (154 lb 15.7 oz) 70.2 kg (154 lb 12.2 oz)    Exam: General:  Seizure activity observed earlier today. Now awake, alert. No acute distress. Cardiovascular: RRR, no m/r/g.  Respiratory: CTA bilaterally, no w/r/r. Normal respiratory effort. Psychiatric: appears depressed, speech fluent and appropriate Neurologic: grossly non-focal.  Data Reviewed: Basic Metabolic Panel:  Lab 03/25/12 3086  NA 140  K 4.8  CL 108  CO2 --  GLUCOSE 109*  BUN 18  CREATININE 1.00  CALCIUM --  MG --  PHOS --   CBC:  Lab 03/25/12 1649  WBC --  NEUTROABS --  HGB 12.6*  HCT 37.0*  MCV --  PLT --  CBG:  Lab 03/25/12 1941  GLUCAP 113*    Studies: No results found.  Scheduled Meds:    . chlorhexidine  15 mL Mouth/Throat QID  . folic acid  1 mg Oral Daily  . lacosamide  200 mg Oral BID  . levETIRAcetam  2,000 mg Oral BID  . phenytoin  200 mg Oral BID  . Rivaroxaban  15 mg Oral BID  . thiamine  100 mg Oral Daily  . topiramate  25 mg Oral BID   Continuous  Infusions:   Principal Problem:  *Seizure disorder Active Problems:  Status epilepticus, generalized convulsive  EtOH dependence  DVT (deep venous thrombosis)  Major depression     Brendia Sacks, MD  [facility] Team 5 Pager [phone]. If 8PM-8AM, please contact night-coverage at [email or web address], password [facility] 03/29/2012, 10:32 AM  LOS: 3 days   Time spent: 20 minutes

## 2012-03-29 NOTE — Progress Notes (Signed)
Pt having seizure activity on and off from 19:06 - 19:48. Total of 6 mg ativan given. Md aware. Now oriented and responsive. VSS. Will continue to monitor. IV nurse attempting IV placement in foot per MD request.

## 2012-03-29 NOTE — Progress Notes (Signed)
Per night nurse patient had 5 seizures during her 12 hour shift and received a total of 8 mg of Ativan IM.  Vitals remained stable, night coverage came to see patient around midnight, no new orders received.

## 2012-03-29 NOTE — Progress Notes (Signed)
Seizure stopped.  Dr. Irene Limbo assessed the patient.  Order received to transfer patient to stepdown.  Jewel Baize, RN received report.

## 2012-03-29 NOTE — Progress Notes (Signed)
Rn called to room by tech who states patient is seizing.  When Rn walked into room patient noted to be shaking and not responding verbally.  VS BP 139/87, P 65, T 98.3 axillary, O2 sat 100% on room air.  Notified MD and primary nurse.  Ativan 2 mg IM given.

## 2012-03-29 NOTE — Progress Notes (Addendum)
Further seizures today, responded to Ativan. Transferred to SDU for closer monitoring. Main complaint continues to be pain. Reports he was on oral morphine as an outpatient, though this cannot be verified. Denies allergy to morphine (listed, but also noted in record that patient tolerated morphine). Never has had hives or anaphylaxis, only itching.  Patient reported dose as morphine immediate release 30 mg to RN, then 60 mg TID to me. Denies ever having been on long-acting medications.   Patient reports last seizure prior to 03/2012 was 2 years ago. He denies drug use. Reports alcohol use on weekends, but no habitually. Works as a Psychologist, occupational. Reports neurologist as being B. Dahve. Thus far, unable to obtain contact information to confirm history and dosing.  Time 35 minutes.  1950--spoke with Dr. Roseanne Reno, reviewed case--recommends increasing Vimpat to 300 mg po BID. Discussed with M. Lynch night coverage and Dr. Vassie Loll. Try peripheral line, if unsuccessful will likely need femoral line.  Brendia Sacks, MD [facility] [phone]

## 2012-03-29 NOTE — Plan of Care (Signed)
Problem: Phase II Progression Outcomes Goal: Seizure activity resolved/baseline Outcome: Not Progressing Per handoff report, five seizures last night (1123 to 11-24).  Today a total of three seizures. Goal: Pain controlled Outcome: Not Progressing Patient has complained continually today of unresolved pain.  Notified MD. Goal: Progress activity as tolerated unless otherwise ordered Outcome: Not Progressing Patient is weak and high seizure risk.

## 2012-03-30 LAB — COMPREHENSIVE METABOLIC PANEL
CO2: 21 mEq/L (ref 19–32)
Calcium: 9.3 mg/dL (ref 8.4–10.5)
Creatinine, Ser: 0.9 mg/dL (ref 0.50–1.35)
GFR calc Af Amer: >90 mL/min (ref >90)
GFR calc non Af Amer: >90 mL/min (ref >90)
Glucose, Bld: 143 mg/dL — ABNORMAL HIGH (ref 70–99)
Total Bilirubin: 0.2 mg/dL — ABNORMAL LOW (ref 0.3–1.2)

## 2012-03-30 LAB — CBC
Hemoglobin: 12.2 g/dL — ABNORMAL LOW (ref 13.0–17.0)
MCHC: 31 g/dL (ref 30.0–36.0)
Platelets: 327 10*3/uL (ref 150–400)
RBC: 4.98 MIL/uL (ref 4.22–5.81)

## 2012-03-30 LAB — PHENYTOIN LEVEL, TOTAL: Phenytoin Lvl: 11.9 ug/mL (ref 10.0–20.0)

## 2012-03-30 LAB — MAGNESIUM: Magnesium: 2.1 mg/dL (ref 1.5–2.5)

## 2012-03-30 MED ORDER — MORPHINE SULFATE 30 MG PO TABS
30.0000 mg | ORAL_TABLET | ORAL | Status: AC | PRN
  Administered 2012-03-30 – 2012-03-31 (×3): 30 mg via ORAL
  Filled 2012-03-30 (×3): qty 1

## 2012-03-30 MED ORDER — MORPHINE SULFATE 2 MG/ML IJ SOLN: 2.0000 mg | INTRAMUSCULAR | Status: DC | PRN

## 2012-03-30 NOTE — Progress Notes (Signed)
MD requesting that psych MD weigh-in on whether [name] IVC needs to be renewed.  Spoke with psych MD.  Psych MD stating that she recommends [name] IVC be renewed.  Obtained MD's signature on IVC paperwork.  Notified Magistrate of impending fax.  Confirmed with Magistrate receipt of fax.   CSW to continue to follow.  Providence Crosby, LCSWA Clinical Social Work [phone]

## 2012-03-30 NOTE — Progress Notes (Signed)
[facility] PROGRESS NOTE  [name] [name] ZOX:[id number] DOB: April 02, 1981 DOA: 03/26/2012 PCP: Sheila Oats, MD reports neurologist as Dr. Hyman Bible. Provides no contact information.  Assessment/Plan: 1. Seizure disorder/refractory epilepsy--no further seizures after ~7 pm last night. Management per neurology--Vimpat and Topamax increased by neurology.  2. Major depressive disorder/SI--management per psychiatry. Have asked for reevaluation today and updated recommendations. To [facility] when stable? 3. DVT RUE--Xarelto. Patient refuses lab draws, therefore not a warfarin candidate. 4. Difficult IV access--unable to establish peripheral IV despite multiple attempts 11/24 PM. Per Dr. 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 Could not access a collateral vein in right neck. Attempt made to access left brachial vein above elbow. [Given DVT RUE] may have to use femoral vein in future for access." Continue to monitor, if worsens, would likely need femoral line. 5. Extensive dental caries-- per Dr. Kristin Bruins, patient needs outpatient oral surgeon--Dr. Florene Route as outpatient for possible dental extraction for dental caries after transfer to behavioral health. Appointment with Dr. Florene Route can be arranged at [phone]. 6. Alcohol abuse  7. Protein C deficiency  8. Report of drug seeking behavior--patient has reported various outpatient regimens--oral morphine 30 mg to 60 mg TID or more frequently.  Met with patient this AM with Jewel Baize RN present. Discussed concerns over frequency of seizures, non-compliance and refusal of lab draws (and at time medications). I am unable to monitor Dilantin level without lab draws. Discussed concern that narcotics may be contributing to seizure frequency. Patient uncooperative, fixated upon receiving pain medication. Will not allow lab draws, will not provide contact information for his neurologist or contact his sister to obtain neurologist's  contact information. Will not allow reported pain medication regimen to be verified. Discussed with Dr. Thad Ranger this AM. She also has been unable to obtain the name or contact information of his neurologist or verify outpatient regimen. Patient displays manipulative behavior and reports various different stories. He attempted to negotiate narcotics last night in return for contact information for his neurologist today. Today he refuses to assist with his care.  Expressed desire to be discharged. Requested psychiatry evaluation to comment upon IVC from 11/21 and current mental state and ability to refuse care.  Code Status: full code Family Communication: none present Disposition Plan: to [facility] when stable  Brendia Sacks, MD  [facility] Team 5 Pager [phone]. If 7PM-7AM, please contact night-coverage at [email or web address], password [facility] 03/30/2012, 8:35 AM  LOS: 4 days   Brief narrative: 31 year old man admitted for refractory epilepsy from [facility] where he was treated for depression, SI.  Consultants:  Neurology  Psychiatry   Procedures:    HPI/Subjective: No further seizures after 10 minute seizure approximately 7 pm last night. Unable to obtain peripheral IV in foot. Complains of whole body pain. Refusing lab work.  Objective: Filed Vitals:   03/29/12 2000 03/30/12 0000 03/30/12 0400 03/30/12 0727  BP: 116/72 112/57 104/61   Pulse: 101 90 62   Temp: 99.7 F (37.6 C) 98.3 F (36.8 C) 97.6 F (36.4 C) 97.8 F (36.6 C)  TempSrc: Oral Oral Core (Comment) Oral  Resp: 19 12 14    Height:      Weight:  70 kg (154 lb 5.2 oz)    SpO2: 97% 98% 98%     Intake/Output Summary (Last 24 hours) at 03/30/12 0835 Last data filed at 03/30/12 0300  Gross per 24 hour  Intake   1080 ml  Output    700 ml  Net    380 ml  Filed Weights   03/28/12 0300 03/29/12 0500 03/30/12 0000  Weight: 70.3 kg (154 lb 15.7 oz) 70.2 kg (154 lb 12.2 oz) 70 kg (154 lb 5.2 oz)    Exam: General:   Appears calm and comfortable, lying in bed, sitter at bedside. RN at bedside during interview and exam. Cardiovascular: RRR, no m/r/g.  Respiratory: CTA bilaterally, no w/r/r. Normal respiratory effort. Psychiatric: appears depressed, speech fluent and appropriate Neurologic: grossly non-focal.  Data Reviewed: Basic Metabolic Panel:  Lab 03/25/12 4540  NA 140  K 4.8  CL 108  CO2 --  GLUCOSE 109*  BUN 18  CREATININE 1.00  CALCIUM --  MG --  PHOS --   CBC:  Lab 03/25/12 1649  WBC --  NEUTROABS --  HGB 12.6*  HCT 37.0*  MCV --  PLT --   CBG:  Lab 03/25/12 1941  GLUCAP 113*    Studies: No results found.  Scheduled Meds:    . chlorhexidine  15 mL Mouth/Throat QID  . docusate sodium  100 mg Oral BID  . folic acid  1 mg Oral Daily  . lacosamide  300 mg Oral BID  . levETIRAcetam  2,000 mg Oral BID  . [COMPLETED] morphine  30 mg Oral Once  . [COMPLETED] morphine  30 mg Oral Once  . [COMPLETED] morphine  30 mg Oral Once  . phenytoin  200 mg Oral BID  . Rivaroxaban  15 mg Oral BID  . thiamine  100 mg Oral Daily  . topiramate  50 mg Oral BID  . [DISCONTINUED] lacosamide  200 mg Oral BID  . [DISCONTINUED] morphine  15 mg Oral Once  . [DISCONTINUED] topiramate  25 mg Oral BID   Continuous Infusions:   Principal Problem:  *Seizure disorder Active Problems:  Status epilepticus, generalized convulsive  EtOH dependence  DVT (deep venous thrombosis)  Major depression     Brendia Sacks, MD  [facility] Team 5 Pager [phone]. If 8PM-8AM, please contact night-coverage at [email or web address], password [facility] 03/30/2012, 8:35 AM  LOS: 4 days   Time spent: 30 minutes

## 2012-03-30 NOTE — Progress Notes (Addendum)
Discussed with RN 1820--patient seem depressed today, did not eat. Refused labs. Refused seizure medications until this afternoon. Complained of some nausea. No vomiting, diarrhea. Mild bradycardia, no agitation.  Sat at bedside with patient [phone]. Patient calm, appeared comfortable. HR 50-60s, normal respiratory rate, oxygenation, blood pressure.  Reported his sister is coming tomorrow with information from his neurologist and medical history/medications. Discussed again my concern about obtaining more information, as well as the need for blood work. He stated he refused "not to refuse, but because they can never get it". He expressed willingness to comply with the attempt to get blood work in AM.  ~1855 seizures started. I witnessed seizures, unequivocally epileptic. HR 140s, RR40s, SpO2 90s. Multiple seizures requiring 8 mg of IM Ativan. Did not lose airway.  Discussed with CCM--will consult this evening. When medically stable, would consider transfer to [facility] ICU.  1939--patient now awake, alert, consents to blood work.  Discussed with night coverage.  [name] Sacks, MD [facility] [phone]

## 2012-03-30 NOTE — Progress Notes (Signed)
Pt has been declined by [facility].  Awaiting disposition from [facility].  CSW to continue to follow.  Providence Crosby, LCSWA Clinical Social Work [phone]

## 2012-03-30 NOTE — Progress Notes (Signed)
CARE MANAGEMENT NOTE 03/30/2012  Patient:  [name] [name],[name] [name]   Account Number:  [id number]  Date Initiated:  03/26/2012  Documentation initiated by:  DAVIS,RHONDA  Subjective/Objective Assessment:   pt having seizures witnessed in ed and icu ongoing     Action/Plan:   will return to [facility]   Anticipated DC Date:  04/02/2012   Anticipated DC Plan:  [facility]  In-house referral  Clinical Social Worker      DC Planning Services  NA      [facility] Choice  NA   Choice offered to / List presented to:  NA   DME arranged  NA      DME agency  NA     HH arranged  NA      HH agency  NA   Status of service:  In process, will continue to follow Medicare Important Message given?  NA - LOS <3 / Initial given by admissions (If response is "NO", the following Medicare IM given date fields will be blank) Date Medicare IM given:   Date Additional Medicare IM given:    Discharge Disposition:    Per UR Regulation:  Reviewed for med. necessity/level of care/duration of stay  If discussed at Long Length of Stay Meetings, dates discussed:    Comments:  [id number] Earlene Plater, RN, BSN, CCM: CHART REVIEWED AND UPDATED.  Next chart review due on [id number]. NO DISCHARGE NEEDS PRESENT AT THIS TIME. CASE MANAGEMENT [phone]   [id number] Earlene Plater, RN, BSN, CCM: CHART REVIEWED AND UPDATED.  Next chart review due on [id number]. NO DISCHARGE NEEDS PRESENT AT THIS TIME. CASE MANAGEMENT [phone]

## 2012-03-30 NOTE — Procedures (Signed)
EEG NUMBER:  REFERRING PHYSICIAN:  Dr. Betti Cruz.  HISTORY:  A 31 year old male with status epilepticus.  MEDICATIONS:  Valium, Dilaudid, Vimpat, Keppra, Ativan, Dilantin, vitamin B, Topamax, Ultram, Lovenox, Lamictal.  CONDITIONS OF RECORDING:  This is a 16-channel EEG carried out with the patient in the awake and drowsy state.  DESCRIPTION:  The waking background activity is poorly sustained.  It consists of a low-voltage, symmetrical, fairly well-organized 8-9 Hz alpha activity seen in the parieto-occipital and posterior temporal regions.  Low-voltage, fast activity, poorly organized was seen anteriorly at times, superimposed and more posterior rhythms.  A mixture of theta and alpha was seen from the central and temporal regions.  The patient drowses easily in the majority of the tracing and during drowse with intervening underlying polymorphic delta activity as well as further slowing of the background rhythm.  Stage II sleep is not obtained.  Hyperventilation was not performed.  Intermittent photic stimulation failed to elicit any change in the tracing.  IMPRESSION:  This is a normal awake and drowsy EEG.  No epileptiform activity was noted.          ______________________________ Thana Farr, MD    [id number] D:  03/26/2012 18:35:39  T:  03/27/2012 13:19:30  Job #:  [id number]

## 2012-03-30 NOTE — Progress Notes (Signed)
Pt experienced seizures starting 18:50 lasting until 19:30. Able to follow commands and oriented 19:40. Received 2 mg Ativan IM x 4. Pt consented to lab draw. MD at bedside. Pt needs access to better manage seizure activity.

## 2012-03-30 NOTE — Progress Notes (Signed)
Patient Identification:  [name] [name] Date of Evaluation:  03/30/2012 Progress Note s/p consultation Reason for Consult:  Conganital Seizure Disorder  Referring Provider:  Dr. Irene Limbo  History of Present Illness:Pt is transferred from [facility] to [facility] due to recurrent seizures. He has a congenital seizure disorder. He claims it took time to get his complement of anti-seizure medications: Dilantin, VimPat, Topomax, Kappra. Pt believes that he began having seizures after anti-seizure medications had been restarted. He says he has tonic-clonic seizures and it takes time for his body to completely relax.   Past Psychiatric History: Congenital seizure disorder, Medication seeking [asks for stimulant for ADHD], receives pain medication, drinks beer- 2-3 beers; denies tobacco, recreational drugs   Past Medical History:     Past Medical History  Diagnosis Date  . Seizure   . Protein C deficiency   . Protein C deficiency        Past Surgical History  Procedure Date  . Insertion of vena cava filter     Allergies:  Allergies  Allergen Reactions  . Fish Allergy Anaphylaxis and Rash  . Depakote (Divalproex Sodium) Swelling  . Morphine And Related Hives    Pt tolerated IV morphine 03/25/2012 admission. Pt tolerated PO morphine 03/29/2012.    Current Medications:  Prior to Admission medications   Medication Sig Start Date End Date Taking? Authorizing Provider  lacosamide 100 MG TABS Take 1 tablet (100 mg total) by mouth 2 (two) times daily. 03/16/12  Yes Meredeth Ide, MD  levETIRAcetam (KEPPRA) 750 MG tablet Take 2 tablets (1,500 mg total) by mouth 2 (two) times daily. 03/16/12  Yes Meredeth Ide, MD  phenytoin (DILANTIN) 100 MG ER capsule Take 2 capsules (200 mg total) by mouth 2 (two) times daily. 03/16/12  Yes Meredeth Ide, MD  rivaroxaban (XARELTO) 10 MG TABS tablet Take 20 mg by mouth 2 (two) times daily.   Yes Historical Provider, MD  traMADol (ULTRAM) 50 MG tablet Take 1 tablet (50  mg total) by mouth every 6 (six) hours as needed. 03/16/12  Yes Meredeth Ide, MD    Social History:    reports that he has been smoking Cigarettes.  He has a 1.25 pack-year smoking history. He has never used smokeless tobacco. He reports that he drinks about 3.6 ounces of alcohol per week. He reports that he uses illicit drugs (Marijuana).   Family History:    History reviewed. No pertinent family history.  Mental Status Examination/Evaluation:  Pt EVALUATION TODAY IS POSTPONED; ARRIVED TO SEE PT. AND HE IS HAVING SEQUENTIAL SEIZURES.  DR. Irene Limbo IS PRESENT. Objective:  Appearance:   Eye Contact::   Speech:  Volume:    Mood:    Affect:   Thought Process:   Orientation:    Thought Content:    Suicidal Thoughts:   Homicidal Thoughts:   Judgement:      DIAGNOSIS:   AXIS I  Seizures, congenital, Pain Medication dependence; Drug seeking   AXIS II  Deferred  AXIS III See medical notes.  AXIS IV economic problems, educational problems, other psychosocial or environmental problems, problems related to social environment and unontrolled seizures  AXIS V 41-50 serious symptoms   Assessment/Plan:  Discussed with Dr. Irene Limbo per Psych CSW Post-ictal status  RECOMMENDATION:  1.  Recommend continuation of IVC due to pt hx of non-adherence w/serious congenital seizure disorder 2.  Will follow pt. Mickeal Skinner MD 03/30/2012 1:26 PM

## 2012-03-31 MED ORDER — MORPHINE SULFATE 30 MG PO TABS
30.0000 mg | ORAL_TABLET | Freq: Four times a day (QID) | ORAL | Status: DC | PRN
  Administered 2012-03-31 – 2012-04-08 (×33): 30 mg via ORAL
  Filled 2012-03-31 (×37): qty 1

## 2012-03-31 NOTE — Progress Notes (Signed)
Per [facility], Pt has been declined due to medical acuity.  Psych MD to assess Pt for continued suicidality.  Per Unit CSW, Pt to be transferred to [facility].  Providence Crosby, LCSWA Clinical Social Work [phone]

## 2012-03-31 NOTE — Progress Notes (Signed)
[facility] picked up pt to transfer to [facility]. VSS. [name] [name]

## 2012-03-31 NOTE — Progress Notes (Signed)
Ask by Dr Irene Limbo to f/u labs and PCCM consult s/p extensive period of seizure activity at approx 1855 this evening. There is concern that pt has no IV access and high risk for airway compromise during seizures. Dr Irene Limbo spoke w/ Dr Delford Field who stated he would consider transfer to [facility]. Lab results w/o acute findings. Phenytoin level is low normal. Pt has rested through-out the shift w/o further seizure activity. VS have remained stable. I spoke w/ Dr Deterding w/ PCCM. She did speak w/ Dr Delford Field this evening regarding this pt and reported the plan for now, given pt is currently stable, is care and evaluation as indicated via [facility] and re-evaluate in am regarding any plans to transfer and/ or establish central access. She did offer if pt's condition changes overnight someone from PCCM would see pt. Will will continue to monitor closely in SDU and augment current plan as indicated.  Leanne Chang, NP-C [facility] Pager [phone]

## 2012-03-31 NOTE — Progress Notes (Addendum)
[facility] PROGRESS NOTE  [name] [name] MWN:[id number] DOB: 03/02/1981 DOA: 03/26/2012 PCP: Sheila Oats, MD reports neurologist as Dr. Hyman Bible. Thus far has not provided contact information.  Assessment/Plan: 1. Seizure disorder/refractory epilepsy--no further seizures after ~7:30 pm last night. Management per neurology--on 4 antiepileptics. Discussed with Dr. Maris Berger barrier to morphine, she does not think likely to exacerbate seizures. 2. Major depressive disorder/SI--per psychiatry. IVC renewed as per psychiatry. 3. DVT RUE--Xarelto. Patient has refused lab draws, therefore not a warfarin candidate. 4. Difficult IV access--unable to establish peripheral IV despite multiple attempts 11/24 PM. Per Dr. 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 Could not access a collateral vein in right neck. Attempt made to access left brachial vein above elbow. [Given DVT RUE] may have to use femoral vein in future for access." 5. Extensive dental caries-- per Dr. Kristin Bruins, patient needs outpatient oral surgeon--Dr. Florene Route as outpatient for possible dental extraction for dental caries after transfer to behavioral health. Appointment with Dr. Florene Route can be arranged at [phone]. 6. Alcohol abuse  7. Protein C deficiency  8. Report of drug seeking behavior--patient has reported various outpatient regimens--oral morphine 30 mg to 60 mg TID or more frequently.  Patient reports sister is coming today with medical information.  Although stable after prolonged seizure last night, access remains a problem, seizure severity seems to be worsening and patient continues to intermittently and unpredictably refuse care. Patient has not gone >24 hours without a seizure since admission. He remains under IVC. Until seizures are controlled think it prudent to transfer to [facility] where neurology is based and there is more resources, especially in the evening. Currently >12 hours  since last seizure, I feel risk of transfer is outweighed by benefits. Discussed with patient the rationale, he agrees. Discussed with [facility] Team 1 Dr. Butler Denmark.  11/20 4 recorded seizures 11/21 3 recorded seizures 11/22 1 recorded seizure 11/23 5 recorded seizures 11/24 2+ recorded seizures 11/25 seizure complex ~40 minutes requiring 8 mg IM Ativan  Code Status: full code Family Communication: none present Disposition Plan: to [facility] when stable  Brendia Sacks, MD  [facility] Team 5 Pager [phone]. If 7PM-7AM, please contact night-coverage at [email or web address], password [facility] 03/31/2012, 7:53 AM  LOS: 5 days   Brief narrative: 31 year old man admitted for refractory epilepsy from [facility] where he was treated for depression, SI. Hospitalization prolonged secondary to recurrent seizures despite 4 antiepileptics. IVC renewed 11/25 per psychiatry for depression, SI.  Consultants:  Neurology  Psychiatry   Procedures:    HPI/Subjective: No further seizures after >30 minutes seizure last night ~7 pm. Labwork was unremarkable. Complains of whole body pain.  Objective: Filed Vitals:   03/30/12 2200 03/31/12 0000 03/31/12 0359 03/31/12 0400  BP: 107/70 111/72 105/61   Pulse: 79 78 73   Temp:  98.1 F (36.7 C) 98.4 F (36.9 C)   TempSrc:  Oral Oral   Resp: 20 16 20    Height:      Weight:    66.9 kg (147 lb 7.8 oz)  SpO2: 97% 99% 98%     Intake/Output Summary (Last 24 hours) at 03/31/12 0753 Last data filed at 03/31/12 0400  Gross per 24 hour  Intake    960 ml  Output    950 ml  Net     10 ml   Filed Weights   03/29/12 0500 03/30/12 0000 03/31/12 0400  Weight: 70.2 kg (154 lb 12.2 oz) 70 kg (154 lb 5.2 oz) 66.9 kg (147 lb  7.8 oz)    Exam: General:  Appears calm and comfortable, sitter at bedside Eyes: Pupils, lids, irises appear normal ENT: grossly normal hearing, lips Cardiovascular: RRR, no m/r/g. No LE edema. Telemetry: SR, no arrhythmias  Respiratory: CTA  bilaterally, no w/r/r. Normal respiratory effort. Abdomen: soft, ntnd Musculoskeletal: grossly normal tone BUE/BLE, moves all extremities to command Psychiatric: grossly normal mood and affect, speech fluent and appropriate Neurologic: grossly non-focal.  Data Reviewed: Basic Metabolic Panel:  Lab 03/30/12 1610 03/25/12 1649  NA 138 140  K 4.1 4.8  CL 105 108  CO2 21 --  GLUCOSE 143* 109*  BUN 17 18  CREATININE 0.90 1.00  CALCIUM 9.3 --  MG 2.1 --  PHOS -- --   CBC:  Lab 03/30/12 1939 03/25/12 1649  WBC 4.0 --  NEUTROABS -- --  HGB 12.2* 12.6*  HCT 39.3 37.0*  MCV 78.9 --  PLT 327 --   CBG:  Lab 03/25/12 1941  GLUCAP 113*    Studies: No results found.  Scheduled Meds:    . chlorhexidine  15 mL Mouth/Throat QID  . docusate sodium  100 mg Oral BID  . folic acid  1 mg Oral Daily  . lacosamide  300 mg Oral BID  . levETIRAcetam  2,000 mg Oral BID  . phenytoin  200 mg Oral BID  . Rivaroxaban  15 mg Oral BID  . thiamine  100 mg Oral Daily  . topiramate  50 mg Oral BID   Continuous Infusions:   Principal Problem:  *Seizure disorder Active Problems:  Status epilepticus, generalized convulsive  EtOH dependence  DVT (deep venous thrombosis)  Major depression     Brendia Sacks, MD  [facility] Team 5 Pager [phone]. If 7PM-7AM, please contact night-coverage at [email or web address], password [facility] 03/31/2012, 7:53 AM  LOS: 5 days   Time spent: 20 minutes

## 2012-03-31 NOTE — Progress Notes (Signed)
Stopped by for follow-up visit w/pt. Nurse was w/pt. Provided short visit.  Marjory Lies Chaplain

## 2012-03-31 NOTE — Consult Note (Signed)
PULMONARY  / CRITICAL CARE MEDICINE  Name: [name] [name] MRN: [id number] DOB: [birth year]    LOS: 5  REFERRING MD :  Irene Limbo   CHIEF COMPLAINT:  IV access/ critical care comments   BRIEF PATIENT DESCRIPTION:  31 year old male w/ h/o protein C def., chronic DVTs, and major depression. Admitted on 11/20 from [facility] for status epilepticus. PCCM asked to eval on 11/26 for possible IV access.    SIGNIFICANT EVENTS:    LEVEL OF CARE:  icu-->can be SDU PRIMARY SERVICE:  [facility] CONS[facility]ANT[facility]:  Neuro, IR for IV access CODE STATUS: full  DIET: :reg  DVT Px:  xarelto   HISTORY OF PRESENT ILLNESS:   31 year old male w/ h/o protein C def., chronic DVTs, and major depression. Admitted on 11/20 from [facility] for status epilepticus. PCCM asked to eval on 11/26 for possible IV access.   PAST MEDICAL HISTORY :  Past Medical History  Diagnosis Date  . Seizure   . Protein C deficiency   . Protein C deficiency    Past Surgical History  Procedure Date  . Insertion of vena cava filter    Prior to Admission medications   Medication Sig Start Date End Date Taking? Authorizing Provider  lacosamide 100 MG TABS Take 1 tablet (100 mg total) by mouth 2 (two) times daily. 03/16/12  Yes Meredeth Ide, MD  levETIRAcetam (KEPPRA) 750 MG tablet Take 2 tablets (1,500 mg total) by mouth 2 (two) times daily. 03/16/12  Yes Meredeth Ide, MD  phenytoin (DILANTIN) 100 MG ER capsule Take 2 capsules (200 mg total) by mouth 2 (two) times daily. 03/16/12  Yes Meredeth Ide, MD  rivaroxaban (XARELTO) 10 MG TABS tablet Take 20 mg by mouth 2 (two) times daily.   Yes Historical Provider, MD  traMADol (ULTRAM) 50 MG tablet Take 1 tablet (50 mg total) by mouth every 6 (six) hours as needed. 03/16/12  Yes Meredeth Ide, MD   Allergies  Allergen Reactions  . Fish Allergy Anaphylaxis and Rash  . Depakote (Divalproex Sodium) Swelling  . Morphine And Related Hives    Pt tolerated IV morphine 03/25/2012 admission. Pt tolerated PO  morphine 03/29/2012.    FAMILY HISTORY:  History reviewed. No pertinent family history. SOCIAL HISTORY:  reports that he has been smoking Cigarettes.  He has a 1.25 pack-year smoking history. He has never used smokeless tobacco. He reports that he drinks about 3.6 ounces of alcohol per week. He reports that he uses illicit drugs (Marijuana).  REVIEW OF SYSTEMS:    Constitutional: No weight loss, gain, night sweats, Fevers, chills, fatigue .  HEENT: No headaches, visual changes, Difficulty swallowing, Tooth/dental problems, or Sore throat,  No sneezing, itching, ear ache, nasal congestion, post nasal drip, no visual complaints CV: No chest pain, Orthopnea, PND, swelling in lower extremities, dizziness, palpitations, syncope.  GI No heartburn, indigestion, abdominal pain, nausea, vomiting, diarrhea, change in bowel habits, loss of appetite, bloody stools.  Resp: No cough, No coughing up of blood. No change in color of mucus. No wheezing.  Skin: no rash or itching or icterus GU: no dysuria, change in color of urine, no urgency or frequency. No flank pain, no hematuria  MS: No joint pain or swelling. No decreased range of motion  Psych: No change in mood or affect. No depression or anxiety.  Neuro: no difficulty with speech, weakness, numbness, ataxia     INTERVAL HISTORY:   VITAL SIGNS: Temp:  [97.5 F (36.4 C)-98.4  F (36.9 C)] 97.5 F (36.4 C) (11/26 0800) Pulse Rate:  [54-113] 81  (11/26 0800) Resp:  [13-24] 18  (11/26 0800) BP: (93-138)/(53-84) 138/72 mmHg (11/26 0800) SpO2:  [97 %-100 %] 100 % (11/26 0800) Weight:  [66.9 kg (147 lb 7.8 oz)] 66.9 kg (147 lb 7.8 oz) (11/26 0400) HEMODYNAMICS:   VENTILATOR SETTINGS:   INTAKE / OUTPUT: Intake/Output      11/25 0701 - 11/26 0700 11/26 0701 - 11/27 0700   P.O. 960    Total Intake(mL/kg) 960 (14.3)    Urine (mL/kg/hr) 950 (0.6)    Total Output 950    Net +10           PHYSICAL EXAMINATION: General:  Awake, alert. No  focal def Neuro:  intact HEENT:  intact Cardiovascular:  rrr Lungs:  clear Abdomen:  Soft, non-tender Musculoskeletal:  intact Skin:  RUE swelling    LABS: Cbc  Lab 03/30/12 1939 03/25/12 1649  WBC 4.0 --  HGB 12.2* 12.6*  HCT 39.3 37.0*  PLT 327 --    Chemistry   Lab 03/30/12 1939 03/25/12 1649  NA 138 140  K 4.1 4.8  CL 105 108  CO2 21 --  BUN 17 18  CREATININE 0.90 1.00  CALCIUM 9.3 --  MG 2.1 --  PHOS -- --  GLUCOSE 143* 109*    Liver fxn  Lab 03/30/12 1939  AST 29  ALT 19  ALKPHOS 140*  BILITOT 0.2*  PROT 7.6  ALBUMIN 3.7   coags No results found for this basename: APTT:3,INR:3 in the last 168 hours Sepsis markers No results found for this basename: LATICACIDVEN:3,PROCALCITON:3 in the last 168 hours Cardiac markers No results found for this basename: CKTOTAL:3,CKMB:3,TROPONINI:3 in the last 168 hours BNP No results found for this basename: PROBNP:3 in the last 168 hours ABG  Lab 03/25/12 1649  PHART --  PCO2ART --  PO2ART --  HCO3 --  TCO2 22    CBG trend  Lab 03/25/12 1941  GLUCAP 113*    IMAGING:  ECG:  DIAGNOSES: Principal Problem:  *Seizure disorder Active Problems:  Status epilepticus, generalized convulsive  EtOH dependence  DVT (deep venous thrombosis)  Major depression   ASSESSMENT / PLAN:  Seizure d/o, s/p status-->seen by neurology. No seizures since PM hours 11/25.  Major depression Suicidal ideation-->f/b psych, has IVC papers pending d/c PLAN:   Cont anticonvulsant rx per neuro Psych admit after d/c  Protein C def RUE DVT Difficult IV access Plan: Cont xeralto IR called. Do not think access is required emergently at this point. He has refused access unless done by IR.   CLINICAL SUMMARY:  This is a 31 year old male w/ PMH of protein C def., RUE DVT and difficult IV access. Admitted from [facility] for seizures. Now >12 hr w/out seizure. Neurology following. Have called IR to assist w/ IV access as he  refuses bedside attempt. Would cont all other rx as outlined. We have nothing else to offer. IF plan is to go to [facility], he does NOT need ICU bed. Think he would be fine in SDU setting.    I have personally obtained a history, examined the patient, evaluated laboratory and imaging results, formulated the assessment and plan and placed orders.  At this point, little for PCCM to do, if patient deteriorates enough from vitals or airway protection please do not hesitate to call.  IR to insert IV access, would seriously consider a more permanent IV access given clinical picture.  PCCM will sign off, please call back if needed.  Patient seen and examined, agree with above note.  I dictated the care and orders written for this patient under my direction.  Koren Bound, M.D. [phone]

## 2012-03-31 NOTE — Progress Notes (Deleted)
NEURO HOSPITALIST PROGRESS NOTE   SUBJECTIVE:                                                                                                                         Per chart patient had seizure activity on 03/30/12 @ 1855 lasting for about 40 minutes. Patietn was given 8 mg Ativan IM over 30 minute period of time.  Intubation was not required. PCCM was consulted to obtain IV access but patient refused and will need IR if further IV access needed.  No further seizure since 03/30/12.  Patient alert and oriented, feeling "achy all over with sore muscles".   Corrected Dilantin 14.2  OBJECTIVE:                                                                                                                           Vital signs in last 24 hours: Temp:  [97.5 F (36.4 C)-98.4 F (36.9 C)] 97.5 F (36.4 C) (11/26 0800) Pulse Rate:  [54-113] 81  (11/26 0800) Resp:  [13-24] 18  (11/26 0800) BP: (93-138)/(53-84) 138/72 mmHg (11/26 0800) SpO2:  [97 %-100 %] 100 % (11/26 0800) Weight:  [66.9 kg (147 lb 7.8 oz)] 66.9 kg (147 lb 7.8 oz) (11/26 0400)  Intake/Output from previous day: 11/25 0701 - 11/26 0700 In: 960 [P.O.:960] Out: 950 [Urine:950] Intake/Output this shift: Total I/O In: 240 [P.O.:240] Out: 350 [Urine:350] Nutritional status: General  Past Medical History  Diagnosis Date  . Seizure   . Protein C deficiency   . Protein C deficiency      Neurologic Exam:   Mental Status: Alert, oriented, thought content appropriate.  Speech fluent without evidence of aphasia.  Able to follow 3 step commands without difficulty. Cranial Nerves: II: Discs flat bilaterally; Visual fields grossly normal, pupils equal, round, reactive to light and accommodation III,IV, VI: ptosis not present, extra-ocular motions intact bilaterally V,VII: smile symmetric, facial light touch sensation normal bilaterally VIII: hearing normal bilaterally IX,X: gag reflex  present XI: bilateral shoulder shrug XII: midline tongue extension Motor: Right : Upper extremity   5/5    Left:     Upper extremity   5/5  Lower extremity   5/5     Lower extremity  5/5 Tone and bulk:normal tone throughout; no atrophy noted Sensory: Pinprick and light touch intact throughout, bilaterally Deep Tendon Reflexes: 2+ and symmetric throughout Cerebellar: normal finger-to-nose,  normal heel-to-shin test CV: pulses palpable throughout    Lab Results: No results found for this basename: cbc, bmp, coags, chol, tri, ldl, hga1c   Lipid Panel No results found for this basename: CHOL,TRIG,HDL,CHOLHDL,VLDL,LDLCALC in the last 72 hours  Studies/Results: No results found.  MEDICATIONS                                                                                                                        Scheduled:   . chlorhexidine  15 mL Mouth/Throat QID  . docusate sodium  100 mg Oral BID  . folic acid  1 mg Oral Daily  . lacosamide  300 mg Oral BID  . levETIRAcetam  2,000 mg Oral BID  . phenytoin  200 mg Oral BID  . Rivaroxaban  15 mg Oral BID  . thiamine  100 mg Oral Daily  . topiramate  50 mg Oral BID    ASSESSMENT/PLAN:                                                                                                               Patient Active Hospital Problem List: Seizure disorder (03/07/2012)   Assessment: Patient continues to have seizures while on Keppra 2000 mg BID, Vimpat (increased to 300 mg BID on 03/29/12), Dilantin 200 mg BID (recent level 14.2) and Topamax 50 mg BID (recent increase in Topamax to 50 mg BID 11/24)   Plan: At this time will Continue current AED regime. Patient being transferred to [facility]. Will continue to follow.     Felicie Morn PA-C [facility] [phone]  03/31/2012, 11:14 AM

## 2012-03-31 NOTE — Progress Notes (Signed)
Pt currently in bed sleeping. No seizure activity observed, pt has been appropriate and calm. PRN pain med requested x 3 during the shift. Safety sitter at bed side, will continue to monitor.

## 2012-03-31 NOTE — Progress Notes (Signed)
Subjective: Last seizure was last evening.  Has done well today.  Vimpat has been recently increased and Topamax recently increased as well.  There has not been enough time for this increase to take effect.  Patient now transferred to [facility].   Objective: Current vital signs: BP 117/87  Pulse 71  Temp 98.6 F (37 C) (Oral)  Resp 17  Ht 5\' 8"  (1.727 m)  Wt 69.6 kg (153 lb 7 oz)  BMI 23.33 kg/m2  SpO2 98% Vital signs in last 24 hours: Temp:  [97.5 F (36.4 C)-98.6 F (37 C)] 98.6 F (37 C) (11/26 1545) Pulse Rate:  [71-113] 71  (11/26 1642) Resp:  [16-24] 17  (11/26 1642) BP: (105-138)/(61-87) 117/87 mmHg (11/26 1642) SpO2:  [97 %-100 %] 98 % (11/26 1642) Weight:  [66.9 kg (147 lb 7.8 oz)-69.6 kg (153 lb 7 oz)] 69.6 kg (153 lb 7 oz) (11/26 1545)  Intake/Output from previous day: 11/25 0701 - 11/26 0700 In: 960 [P.O.:960] Out: 950 [Urine:950] Intake/Output this shift: Total I/O In: 480 [P.O.:480] Out: 500 [Urine:500] Nutritional status: General  Neurologic Exam: Mental Status:  Patient is awake and oriented. Follows three-step commands without difficulty.  Cranial Nerves:  II: Visual Fields are full. Pupils are equal, round, and reactive to light. Discs are difficult to visualize.  III,IV, VI: EOMI without ptosis or diploplia.  V: Facial sensation is symmetric to temperature  VII: Facial movement is symmetric.  VIII: hearing is intact to voice  X/XI: intact gag  XII: tongue is midline  Motor:  5/5 strength was present in all four extremities.  Sensory:  Sensation is symmetric to light touch and in the arms and legs.  Deep Tendon Reflexes:  2+ and symmetric  Cerebellar:  Finger to nose and heel to shin intact bilaterally   Lab Results: Basic Metabolic Panel:  Lab 03/30/12 2130 03/25/12 1649  NA 138 140  K 4.1 4.8  CL 105 108  CO2 21 --  GLUCOSE 143* 109*  BUN 17 18  CREATININE 0.90 1.00  CALCIUM 9.3 --  MG 2.1 --  PHOS -- --    Liver Function  Tests:  Lab 03/30/12 1939  AST 29  ALT 19  ALKPHOS 140*  BILITOT 0.2*  PROT 7.6  ALBUMIN 3.7   No results found for this basename: LIPASE:5,AMYLASE:5 in the last 168 hours No results found for this basename: AMMONIA:3 in the last 168 hours  CBC:  Lab 03/30/12 1939 03/25/12 1649  WBC 4.0 --  NEUTROABS -- --  HGB 12.2* 12.6*  HCT 39.3 37.0*  MCV 78.9 --  PLT 327 --    Cardiac Enzymes: No results found for this basename: CKTOTAL:5,CKMB:5,CKMBINDEX:5,TROPONINI:5 in the last 168 hours  Lipid Panel: No results found for this basename: CHOL:5,TRIG:5,HDL:5,CHOLHDL:5,VLDL:5,LDLCALC:5 in the last 168 hours  CBG:  Lab 03/25/12 1941  GLUCAP 113*    Microbiology: Results for orders placed during the hospital encounter of 03/26/12  MRSA PCR SCREENING     Status: Normal   Collection Time   03/31/12  3:47 PM      Component Value Range Status Comment   MRSA by PCR NEGATIVE  NEGATIVE Final     Coagulation Studies: No results found for this basename: LABPROT:5,INR:5 in the last 72 hours  Imaging: No results found.  Medications:  I have reviewed the patient's current medications. Scheduled:   . chlorhexidine  15 mL Mouth/Throat QID  . docusate sodium  100 mg Oral BID  . folic acid  1  mg Oral Daily  . lacosamide  300 mg Oral BID  . levETIRAcetam  2,000 mg Oral BID  . phenytoin  200 mg Oral BID  . Rivaroxaban  15 mg Oral BID  . thiamine  100 mg Oral Daily  . topiramate  50 mg Oral BID    Assessment/Plan:  Patient Active Hospital Problem List: Seizure disorder (03/07/2012)   Assessment: Patient's last seizure yesterday.  Stable today.     Plan: No change in medications recommended for today.      LOS: 5 days   Thana Farr, MD [facility] [phone] 03/31/2012  7:00 PM

## 2012-04-01 DIAGNOSIS — F1910 Other psychoactive substance abuse, uncomplicated: Secondary | ICD-10-CM

## 2012-04-01 NOTE — Progress Notes (Addendum)
[facility] Progress Note [place] TEAM 1 - Stepdown/ICU TEAM   [name] [name] ZOX:[id number] DOB: 1980-05-14 DOA: 03/26/2012 PCP: Sheila Oats, MD  Brief narrative: 31 year old man admitted for refractory epilepsy from [facility] where he was treated for depression, SI. Hospitalization prolonged secondary to recurrent seizures despite 4 antiepileptics. IVC renewed 11/25 per Psychiatry for depression, SI.  Although stable after prolonged seizure 11/25 PM, access remained a problem, seizure severity seemed to be worsening overall, and patient continued to intermittently and unpredictably refuse care. Patient had not gone >24 hours without a seizure since admission up to 11/26. He remained under IVC. Until seizures are controlled it was thought prudent to transfer to [facility] where Neurology is based. .  11/20 4 recorded seizures  11/21 3 recorded seizures  11/22 1 recorded seizure  11/23 5 recorded seizures  11/24 2+ recorded seizures  11/25 seizure complex ~40 minutes requiring 8 mg IM Ativan   Assessment/Plan:  Seizure disorder/refractory epilepsy no further seizures after ~7:30 pm 11/25 - management per neurology - appears they may be ready to medically clear him by 11/28  Major depressive disorder/SI per Psychiatry - I called Psych today to assure the pt is to be seen in f/u in that disposition decision will likely need to be made in next 24hrs  DVT RUE Xarelto - patient has refused lab draws, therefore not a warfarin candidate  Difficult IV access unable to establish peripheral IV despite multiple attempts 11/24 - per Dr. 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 Could not access a collateral vein in right neck. Attempt made to access left brachial vein above elbow. [Given DVT RUE] may have to use femoral vein in future for access." - at present there is no absolute requirement for an IV (seiz has been arrested previously w/ IM ativan)  Extensive dental  caries per Dr. Kristin Bruins, patient needs outpatient oral surgeon.  Dr. Florene Route as outpatient for possible dental extraction for dental caries after transfer to behavioral health. Appointment with Dr. Florene Route can be arranged at [phone].  Alcohol abuse   Protein C deficiency  On chronic anticoag  Report of drug seeking behavior patient has reported various outpatient regimens - cont current tx plan w/o change   Code Status: FULL Disposition Plan: remain on SDU - possibly to be medically cleared by AM 11/28 - Psych to comment on ultimate d/c plan   Consultants: Neuro Psych  Procedures: none  Antibiotics: none  DVT prophylaxis: xarelto  HPI/Subjective: The patient had no complaints when I examined him on November 27.  He was quite anxious to be discharged home.  He denied fevers chills nausea vomiting or shortness of breath.   Objective: Blood pressure 102/70, pulse 61, temperature 97.9 F (36.6 C), temperature source Oral, resp. rate 12, height 5\' 8"  (1.727 m), weight 70.7 kg (155 lb 13.8 oz), SpO2 96.00%.  Intake/Output Summary (Last 24 hours) at 04/01/12 1359 Last data filed at 04/01/12 1018  Gross per 24 hour  Intake    960 ml  Output    850 ml  Net    110 ml     Exam: General: No acute respiratory distress Lungs: Clear to auscultation bilaterally without wheezes or crackles Cardiovascular: Regular rate and rhythm without murmur gallop or rub normal S1 and S2 Abdomen: Nontender, nondistended, soft, bowel sounds positive, no rebound, no ascites, no appreciable mass Extremities: No significant cyanosis, clubbing, or edema bilateral lower extremities  Data Reviewed: Basic Metabolic Panel:  Lab 03/30/12 8295  03/25/12 1649  NA 138 140  K 4.1 4.8  CL 105 108  CO2 21 --  GLUCOSE 143* 109*  BUN 17 18  CREATININE 0.90 1.00  CALCIUM 9.3 --  MG 2.1 --  PHOS -- --   Liver Function Tests:  Lab 03/30/12 1939  AST 29  ALT 19  ALKPHOS 140*  BILITOT  0.2*  PROT 7.6  ALBUMIN 3.7   CBC:  Lab 03/30/12 1939 03/25/12 1649  WBC 4.0 --  NEUTROABS -- --  HGB 12.2* 12.6*  HCT 39.3 37.0*  MCV 78.9 --  PLT 327 --   CBG:  Lab 03/25/12 1941  GLUCAP 113*    Recent Results (from the past 240 hour(s))  MRSA PCR SCREENING     Status: Normal   Collection Time   03/31/12  3:47 PM      Component Value Range Status Comment   MRSA by PCR NEGATIVE  NEGATIVE Final      Studies:  Recent x-ray studies have been reviewed in detail by the Attending Physician  Scheduled Meds:  Reviewed in detail by the Attending Physician   Lonia Blood, MD [facility] Office  [phone] Pager [phone]  On-Call/Text Page:      [email or web address]      password [email or web address]  If 7PM-7AM, please contact night-coverage [email or web address] Password [email or web address] 04/01/2012, 1:59 PM   LOS: 6 days

## 2012-04-01 NOTE — Progress Notes (Signed)
Pt was resting today,but remembered I stopped by yesterday. He said his head was hurting really badly. I did not visit long - pt thanked me for coming by again.  Marjory Lies Chaplain

## 2012-04-01 NOTE — Progress Notes (Signed)
NEURO HOSPITALIST PROGRESS NOTE   SUBJECTIVE:                                                                                                                         No further seizures. Patient is feeling much better and looking forward to going home.  OBJECTIVE:                                                                                                                           Vital signs in last 24 hours: Temp:  [97.8 F (36.6 C)-98.6 F (37 C)] 98 F (36.7 C) (11/27 0722) Pulse Rate:  [62-80] 69  (11/27 0722) Resp:  [10-19] 10  (11/27 0722) BP: (105-120)/(56-87) 110/56 mmHg (11/27 0722) SpO2:  [98 %-100 %] 98 % (11/27 0722) Weight:  [69.6 kg (153 lb 7 oz)-70.7 kg (155 lb 13.8 oz)] 70.7 kg (155 lb 13.8 oz) (11/27 0500)  Intake/Output from previous day: 11/26 0701 - 11/27 0700 In: 1200 [P.O.:1200] Out: 900 [Urine:900] Intake/Output this shift:   Nutritional status: General  Past Medical History  Diagnosis Date  . Seizure   . Protein C deficiency   . Protein C deficiency     Neurologic ROS negative with exception of above. Musculoskeletal ROS no abnormalities  Neurologic Exam:  Mental Status:  Alert, oriented, thought content appropriate. Speech fluent without evidence of aphasia. Able to follow 3 step commands without difficulty.  Cranial Nerves:  II: Discs flat bilaterally; Visual fields grossly normal, pupils equal, round, reactive to light and accommodation  III,IV, VI: ptosis not present, extra-ocular motions intact bilaterally  V,VII: smile symmetric, facial light touch sensation normal bilaterally  VIII: hearing normal bilaterally  IX,X: gag reflex present  XI: bilateral shoulder shrug  XII: midline tongue extension  Motor:  Right : Upper extremity 5/5 Left: Upper extremity 5/5  Lower extremity 5/5 Lower extremity 5/5  Tone and bulk:normal tone throughout; no atrophy noted  Sensory: Pinprick and light touch intact  throughout, bilaterally  Deep Tendon Reflexes: 2+ and symmetric throughout  Cerebellar:  normal finger-to-nose, normal heel-to-shin test  CV: pulses palpable throughout    Lab Results: No results found for this basename: cbc, bmp, coags, chol, tri, ldl, hga1c  Lipid Panel No results found for this basename: CHOL,TRIG,HDL,CHOLHDL,VLDL,LDLCALC in the last 72 hours  Studies/Results: No results found.  MEDICATIONS                                                                                                                        Scheduled:   . chlorhexidine  15 mL Mouth/Throat QID  . docusate sodium  100 mg Oral BID  . folic acid  1 mg Oral Daily  . lacosamide  300 mg Oral BID  . levETIRAcetam  2,000 mg Oral BID  . phenytoin  200 mg Oral BID  . Rivaroxaban  15 mg Oral BID  . thiamine  100 mg Oral Daily  . topiramate  50 mg Oral BID    ASSESSMENT/PLAN:                                                                                                               Patient Active Hospital Problem List: Seizure disorder (03/07/2012)   Assessment: No further seizure activity over night.   Plan: No change in medications recommended at this time.    Felicie Morn PA-C [facility] [phone]  04/01/2012, 9:59 AM    Ritta Slot, MD [facility] [phone]  If 7pm- 7am, please page neurology on call at [phone].

## 2012-04-01 NOTE — Progress Notes (Signed)
Progress Note s/p consultation Patient Identification:  [name] [name] Date of Evaluation:  04/01/2012 Reason for Consult:  Seizure Disorder   Referring Provider: Dr. Sharon Seller  History of Present Illness:Pt had been admitted to [facility] where anticonvulsants dropped below therapeutic level.  He began having seizures and was transferred to [facility]  Today he has been transferred to [facility] ICU.  Neurology will be following him there  Past Psychiatric History:Pt has been depressed, pain medication dependent, sporadic refusal of recommended treatment, some alcohol use, drug seeking   Past Medical History:     Past Medical History  Diagnosis Date  . Seizure   . Protein C deficiency   . Protein C deficiency        Past Surgical History  Procedure Date  . Insertion of vena cava filter     Allergies:  Allergies  Allergen Reactions  . Fish Allergy Anaphylaxis and Rash  . Depakote (Divalproex Sodium) Swelling  . Morphine And Related Hives    Pt tolerated IV morphine 03/25/2012 admission. Pt tolerated PO morphine 03/29/2012.    Current Medications:  Prior to Admission medications   Medication Sig Start Date End Date Taking? Authorizing Provider  lacosamide 100 MG TABS Take 1 tablet (100 mg total) by mouth 2 (two) times daily. 03/16/12  Yes Meredeth Ide, MD  levETIRAcetam (KEPPRA) 750 MG tablet Take 2 tablets (1,500 mg total) by mouth 2 (two) times daily. 03/16/12  Yes Meredeth Ide, MD  phenytoin (DILANTIN) 100 MG ER capsule Take 2 capsules (200 mg total) by mouth 2 (two) times daily. 03/16/12  Yes Meredeth Ide, MD  rivaroxaban (XARELTO) 10 MG TABS tablet Take 20 mg by mouth 2 (two) times daily.   Yes Historical Provider, MD  traMADol (ULTRAM) 50 MG tablet Take 1 tablet (50 mg total) by mouth every 6 (six) hours as needed. 03/16/12  Yes Meredeth Ide, MD    Social History:    reports that he has been smoking Cigarettes.  He has a 1.25 pack-year smoking history. He has never used smokeless  tobacco. He reports that he drinks about 3.6 ounces of alcohol per week. He reports that he uses illicit drugs (Marijuana).   Family History:    History reviewed. No pertinent family history.  Mental Status Examination/Evaluation: Objective:  Appearance: Casual  Eye Contact::  Good  Speech:  Clear and Coherent  Volume:  Decreased  Mood:  fatigued  Affect:  Blunt and Congruent  Thought Process:  Coherent, Goal Directed and Logical  Orientation:  Full  Thought Content:  hoping for control of seizures  Suicidal Thoughts:  No  Homicidal Thoughts:  No  Judgement:  Fair  Insight:  Fair   DIAGNOSIS:   AXIS I   Congenital seizures, partially controlled, Pain medication dependence; alcohol abuse  AXIS II  Deferred  AXIS III See medical notes.  AXIS IV occupational problems, other psychosocial or environmental problems, problems related to legal system/crime and  neds supportive constact  AXIS V 51-60 moderate symptoms   Assessment/Plan:  Discussed with Dr. Thad Ranger Pt has had no seizure since yesterday.  He says he feels very tired.  He is calm with good eye contact and normal rate of speech.  He is coherent.  He is cautioned about alcohol use.  He misinterprets comment as comment about alcoholism.  He is reminded that alcohol is not his 'friend'.  He then recalls that alcohol wll lower his seizure threshold.  He is cognitively intact and has capacity  even when he decides issues not in his best interest e.g. Refusing diagnostics. RECOMMENDATION:  1.  Will follow pt. Per coverage for consultant. Mickeal Skinner MD  04/01/2012 9:44 AM

## 2012-04-01 NOTE — Progress Notes (Signed)
Clinical Social Work  CSW received hand off from [employer] CSW in regards to patient's care. CSW reviewed chart and attempted to meet with patient. Patient was sleeping and reported that he had not slept all night. Patient reports that he is not able to talk with CSW due to being too tired and asked CSW to follow up at a later time.  CSW called [facility] in order to determine his status of being able to return. Per chart review, [facility] CSW has faxed out patient to other facilities as alternative options. CSW spoke with Fulton Mole at [facility] who reports that when patient is medically ready then another referral can be made to determine if he can be accepted at [facility].  CSW staffed case with psych MD who plans to meet with patient today. CSW will continue to follow.  [place], [place] [phone]

## 2012-04-02 DIAGNOSIS — F322 Major depressive disorder, single episode, severe without psychotic features: Secondary | ICD-10-CM

## 2012-04-02 NOTE — Progress Notes (Signed)
[facility] Progress Note [place] TEAM 1 - Stepdown/ICU TEAM   Diar Xxx-Cundy ZHY:[id number] DOB: [birth year] DOA: 03/26/2012 PCP: Sheila Oats, MD  Brief narrative: 31 year old man admitted for refractory epilepsy from [facility] where he was treated for depression, SI. Hospitalization prolonged secondary to recurrent seizures despite 4 antiepileptics. IVC renewed 11/25 per Psychiatry for depression, SI.  Although stable after prolonged seizure 11/25 PM, access remained a problem, seizure severity seemed to be worsening overall, and patient continued to intermittently and unpredictably refuse care. Patient had not gone >24 hours without a seizure since admission up to 11/26. He remained under IVC. Until seizures are controlled it was thought prudent to transfer to [facility] where Neurology is based. .  11/20 4 recorded seizures  11/21 3 recorded seizures  11/22 1 recorded seizure  11/23 5 recorded seizures  11/24 2+ recorded seizures  11/25 seizure complex ~40 minutes requiring 8 mg IM Ativan   Assessment/Plan:  Seizure disorder / refractory epilepsy no further seizures after ~7:30 pm 11/25 - management per neurology - neuro has cleared from their standpoint if home supervision can be arranged  Major depressive disorder/SI per Psychiatry who continues to follow the patient at home - as per my discussion today with the attending psychiatrist the patient may be a candidate for intensive outpatient therapy - a psychiatrist however wishes to discuss this option with the patient's family prior to consideration of making this formal recommendation  DVT RUE Xarelto - patient has refused lab draws, therefore not a warfarin candidate  Difficult IV access unable to establish peripheral IV despite multiple attempts 11/24 - per Dr. 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 Could not access a collateral vein in right neck. Attempt made to access left brachial vein above  elbow. [Given DVT RUE] may have to use femoral vein in future for access." - at present there is no absolute requirement for an IV (seiz has been arrested previously w/ IM ativan)  Extensive dental caries per Dr. Kristin Bruins, patient needs outpatient oral surgeon - Dr. Florene Route as outpatient for possible dental extraction for dental caries after transfer to behavioral health - appointment with Dr. Florene Route can be arranged at [phone].  Alcohol abuse   Protein C deficiency  On chronic anticoag  Report of drug seeking behavior patient has reported various outpatient regimens - cont current tx plan w/o change  Code Status: FULL Disposition Plan: remain on SDU - possibly to be discharged in the a.m. - Psych to comment on ultimate d/c plan Friday morning  Consultants: Neuro Psych  Procedures: none  Antibiotics: none  DVT prophylaxis: xarelto  HPI/Subjective: The patient is in very good spirits today.  He denies complaints.  He is looking forward to hopefully be discharged home tomorrow.   Objective: Blood pressure 106/69, pulse 58, temperature 98 F (36.7 C), temperature source Oral, resp. rate 16, height 5\' 8"  (1.727 m), weight 69.5 kg (153 lb 3.5 oz), SpO2 97.00%.  Intake/Output Summary (Last 24 hours) at 04/02/12 1643 Last data filed at 04/02/12 1528  Gross per 24 hour  Intake    480 ml  Output   1200 ml  Net   -720 ml     Exam: No acute findings on basic physical exam.  Data Reviewed: Basic Metabolic Panel:  Lab 03/30/12 3244  NA 138  K 4.1  CL 105  CO2 21  GLUCOSE 143*  BUN 17  CREATININE 0.90  CALCIUM 9.3  MG 2.1  PHOS --  Liver Function Tests:  Lab 03/30/12 1939  AST 29  ALT 19  ALKPHOS 140*  BILITOT 0.2*  PROT 7.6  ALBUMIN 3.7   CBC:  Lab 03/30/12 1939  WBC 4.0  NEUTROABS --  HGB 12.2*  HCT 39.3  MCV 78.9  PLT 327    Recent Results (from the past 240 hour(s))  MRSA PCR SCREENING     Status: Normal   Collection Time    03/31/12  3:47 PM      Component Value Range Status Comment   MRSA by PCR NEGATIVE  NEGATIVE Final      Studies:  Recent x-ray studies have been reviewed in detail by the Attending Physician  Scheduled Meds:  Reviewed in detail by the Attending Physician   Lonia Blood, MD [facility] Office  [phone] Pager [phone]  On-Call/Text Page:      [email or web address]      password [email or web address]  If 7PM-7AM, please contact night-coverage [email or web address] Password [facility] 04/02/2012, 4:43 PM   LOS: 7 days

## 2012-04-02 NOTE — Progress Notes (Addendum)
Subjective: No further seizures  Exam: Filed Vitals:   04/02/12 0827  BP:   Pulse:   Temp: 97.3 F (36.3 C)  Resp:    Gen: In bed, NAD MS: Awake, alert, able to give # of quarters in $2.75.  ZO:XWRUE, EOMI Motor: 5/5 throughout, no drift Sensory:intact to LT  Impression: 31 yo M with refractory epilepsy. He is being titrated up on topamax in addition to his current doses of levetiracetam, vimpat, dilantin. He seems to have stabilized on his current medications, but given his propensity to have prolonged seizures, I would be hesitant to discharge him without a plan to stay with someone who could stay with him.   His vimpat dose is higher than typically used, but dilantin can enhance the metabolism and given that he seems stable without side effects will continue this dose until theraputic on topamax.   Recommendations: 1)Continue topamax(day 4 of 50 BID) 2) Continue vimpat 300mg  BID 3) Continue dilantin 200mg  BID 4) Continue lev 2000mg  BID.  5) Once able to arrange supervision at home, could consider discharge. (if cleared from psychiatric perspective)  Ritta Slot, MD [facility] [phone]  If 7pm- 7am, please page neurology on call at [phone].

## 2012-04-02 NOTE — Consult Note (Signed)
Reason for Consult: Depression, Seizure Disorder Referring Physician: Dr. Blanch Media [name] is an 31 y.o. male.   HPI: [name] is a 31 y/o male with a past psychiatric history for Alcohol Abuse and question of dependence of pain medications.  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 The patient is visiting family for Thanksgiving, and had been spending the day with his friend [name]. He talked to his friend about the depressive symptoms he had been experiencing including suicidal thoughts and his friend advised him to seek help. He came to [name] ED and was admitted to [facility]. He began having seizures and was transported to [name] where he was stabilized. The patient reports his depression has been present since July 6th, 2013, the day his daughter and wife (whom he had been separated from) died, and has progressively worsened. The patient endorses poor sleep, anhedonia, crying spells, and suicidal thought over the past 5 months.  He has not gone for counseling, and denies any prior psychiatric treatment.     The patient denies any previous episodes consistent with mania or hypomania. The patient denies any episodes of psychosis. The patient endorses nightmares, but no further symptoms consistent with PTSD. Patient denies current suicidal ideation, intent, or plan. Patient denies current homicidal ideation, intent, or plan. Patient denies auditory hallucinations. Patient denies visual hallucinations. Patient denies symptoms of paranoia. Patient states sleep has improved, with approximately 6 hours of sleep per night. Appetite is improved. Energy level is fair.  He denies excessive worry to the point of physical symptoms as well as any panic attacks.  Reviewing the patient's history he does have a history of Alcohol Dependence and poor compliance with medications, which the patient minimized.  He gave verbal consent for this provider to speak with  his stepfather, who employs him.   Past Psychiatric History: Medications: Patient denies being tried on any previous antidepressants. Outpatient psychiatric treatment: Patient denies. Inpatient psychiatric treatment: One admission 03/2012 Suicide attempts: Patient denies  Past Medical History  Diagnosis Date  . Seizure   . Protein C deficiency   . Protein C deficiency     Past Surgical History  Procedure Date  . Insertion of vena cava filter     History reviewed. No pertinent family history.  Social History:  reports that he has been smoking Cigarettes.  He has a 1.25 pack-year smoking history. He has never used smokeless tobacco. He reports that he drinks about 3.6 ounces of alcohol per week. He reports that he uses illicit drugs (Marijuana).  Allergies:  Allergies  Allergen Reactions  . Fish Allergy Anaphylaxis and Rash  . Depakote (Divalproex Sodium) Swelling  . Morphine And Related Hives    Pt tolerated IV morphine 03/25/2012 admission. Pt tolerated PO morphine 03/29/2012.    Medications: I have reviewed the patient's current medications. No current facility-administered medications on file prior to encounter.   Current Outpatient Prescriptions on File Prior to Encounter  Medication Sig Dispense Refill  . lacosamide 100 MG TABS Take 1 tablet (100 mg total) by mouth 2 (two) times daily.  60 tablet  2  . levETIRAcetam (KEPPRA) 750 MG tablet Take 2 tablets (1,500 mg total) by mouth 2 (two) times daily.  120 tablet  3  . phenytoin (DILANTIN) 100 MG ER capsule Take 2 capsules (200 mg total) by mouth 2 (two) times daily.  60 capsule  2  . traMADol (ULTRAM) 50 MG tablet Take 1 tablet (50  mg total) by mouth every 6 (six) hours as needed.  30 tablet  0   Scheduled Meds:   . docusate sodium  100 mg Oral BID  . folic acid  1 mg Oral Daily  . lacosamide  300 mg Oral BID  . levETIRAcetam  2,000 mg Oral BID  . phenytoin  200 mg Oral BID  . Rivaroxaban  15 mg Oral BID  .  thiamine  100 mg Oral Daily  . topiramate  50 mg Oral BID  . [DISCONTINUED] chlorhexidine  15 mL Mouth/Throat QID   Continuous Infusions:  PRN Meds:.acetaminophen, acetaminophen, LORazepam, morphine, nicotine polacrilex, ondansetron (ZOFRAN) IV, ondansetron   No results found for this or any previous visit (from the past 48 hour(s)).  No results found.  Review of Systems  Constitutional: Negative.   Respiratory: Negative.   Cardiovascular: Negative.   Gastrointestinal: Negative.    Blood pressure 106/69, pulse 58, temperature 98 F (36.7 C), temperature source Oral, resp. rate 16, height 5\' 8"  (1.727 m), weight 69.5 kg (153 lb 3.5 oz), SpO2 97.00%.  Physical Exam  Vitals reviewed. Constitutional: He appears well-developed and well-nourished. No distress.  Skin: He is not diaphoretic.    Mental Status Examination/Evaluation: Objective:  Appearance:  Casual, Disheveled,Guarded, Meticulous, Neat and Well Groomed  Eye Contact::   fair  Speech: Clear and Coherent   Volume:  Normal  Mood:  "okay"  Affect:  Blunt  Thought Process:   Coherent,  Linear  Orientation:  Alert and oriented x 4  Thought Content:  WDL  Suicidal Thoughts:  Patient denies suicidal ideation, intent or plans.  Homicidal Thoughts: Patient denies homicidal ideation, intent or plans.  Judgement: intact  Insight: Absent, Intact  Psychomotor Activity:  Normal,   Akathisia:  None  Memory:  Recent and remote intact  Assets:  Communication Skills Desire for Improvement Financial Resources/Insurance Housing Social Support Talents/Skills Transportation Vocational/Educational   Assessment/Plan: Axis I Clinical Disorder:   Major Depression, severe, recurrent without psychotic features.  Alcohol Dependence, rule out opiate dependence. Axis II : No Diagnosis  Axis III : Seizure Disorder Axis IV Current Psychosocial Stressors:  primary support group, Other psychosocial and environmental problems: death of  ex-wife and 8 y/o daughter Axis V GAF Score (current level of functioning): 50  PLAN:  1. Patient denies current suicidal or homicidal ideation, intent or plans.  2. Recommend initiation of an antidepressant for this patient.  a) May use sertraline 25-50 mg or escitalopram 10-20 mg, given his current medications. 3. Discussed psychosocial stressors. The patient would benefit from Intensive outpatient treatment, after his current inpatient treatment, which is available in [place] where the patient lives. 4. Please discuss risks and benefits, side effects and alternatives with patient, if started on an antidepressant. 5. Patient told to call clinic if any problems occur. Patient advised to go to ER  if he should develop SI/HI, side effects, or if symptoms worsen. Please provide patient with crisis numbers to call, if needed 6. Reviewed patient's labs. 7. The patient was encouraged to keep all PCP and specialty clinic appointments.  8. Will need to discuss patient with his stepfather prior to discharge, attempted to reach him [phone]) but was unable to do so, patient states he is currently working and will be at the hospital tomorrow. 9. The patient expressed understanding of the plan outlined above and agrees with the plan.  Jersi Mcmaster 04/02/2012, 6:17 PM

## 2012-04-03 MED ORDER — TOPIRAMATE 25 MG PO TABS
75.0000 mg | ORAL_TABLET | Freq: Two times a day (BID) | ORAL | Status: DC
  Administered 2012-04-03 – 2012-04-07 (×9): 75 mg via ORAL
  Filled 2012-04-03 (×10): qty 3

## 2012-04-03 MED ORDER — SERTRALINE HCL 50 MG PO TABS
50.0000 mg | ORAL_TABLET | Freq: Every day | ORAL | Status: DC
  Administered 2012-04-03 – 2012-04-18 (×16): 50 mg via ORAL
  Filled 2012-04-03 (×17): qty 1

## 2012-04-03 NOTE — Progress Notes (Signed)
[facility] Progress Note [place] TEAM 1 - Stepdown/ICU TEAM   [name] ZOX:[id number] DOB: 04-Feb-1981 DOA: 03/26/2012 PCP: Sheila Oats, MD  Brief narrative: 31 year old man admitted for refractory epilepsy from [facility] where he was treated for depression, SI. Hospitalization prolonged secondary to recurrent seizures despite 4 antiepileptics. IVC renewed 11/25 per Psychiatry for depression, SI.  Although stable after prolonged seizure 11/25 PM, access remained a problem, seizure severity seemed to be worsening overall, and patient continued to intermittently and unpredictably refuse care. Patient had not gone >24 hours without a seizure since admission up to 11/26. He remained under IVC. Until seizures are controlled it was thought prudent to transfer to [facility] where Neurology is based. .  11/20 4 recorded seizures  11/21 3 recorded seizures  11/22 1 recorded seizure  11/23 5 recorded seizures  11/24 2+ recorded seizures  11/25 seizure complex ~40 minutes requiring 8 mg IM Ativan   Assessment/Plan:  Seizure disorder / refractory epilepsy no seizure since ~7:30 pm 11/25 until 6:30am today when pt had 1 vs/ 2 seizures, the first of which lasted 5-6 minutes, but was arrested by IM ativan - management per Neurology - would like at least 48hrs seizure free before cleared for d/c to next venue - meds adjusted   Major depressive disorder/SI per Psychiatry who continues to follow the patient - unfortunately, it appears that the pt does not have involved family as he previously reported - as a result, Psych is suggesting that pt remain under IVC and that we pursue inpt psych hospitalization once cleared medically (which will now be no sooner than Sunday/Monday) - I have begun sertraline at the suggestion of Pscyh  DVT RUE Xarelto - patient has refused lab draws, therefore not a warfarin candidate  Difficult IV access unable to establish peripheral IV despite multiple attempts  11/24 - per Dr. 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 Could not access a collateral vein in right neck. Attempt made to access left brachial vein above elbow. [Given DVT RUE] may have to use femoral vein in future for access." - at present there is no absolute requirement for an IV (seiz has been arrested previously w/ IM ativan)  Extensive dental caries per Dr. Kristin Bruins, patient needs outpatient oral surgeon - Dr. Florene Route as outpatient for possible dental extraction for dental caries after transfer to behavioral health - appointment with Dr. Florene Route can be arranged at [phone].  Alcohol abuse   Protein C deficiency  On chronic anticoag  Report of drug seeking behavior patient has reported various outpatient regimens - cont current tx plan w/o change  Code Status: FULL Disposition Plan: remain on SDU due to active seizure d/o w/ risk of status epilepticus Consultants: Neuro Psych  Procedures: none  Antibiotics: none  DVT prophylaxis: xarelto  HPI/Subjective: Pt is lethargic/post-ictal.  He denies specific complaints at this time.     Objective: Blood pressure 131/95, pulse 74, temperature 97.6 F (36.4 C), temperature source Oral, resp. rate 15, height 5\' 8"  (1.727 m), weight 69.5 kg (153 lb 3.5 oz), SpO2 97.00%.  Intake/Output Summary (Last 24 hours) at 04/03/12 1343 Last data filed at 04/03/12 1100  Gross per 24 hour  Intake    480 ml  Output   1700 ml  Net  -1220 ml     Exam: General: No acute respiratory distress - lethargic  Lungs: Clear to auscultation bilaterally without wheezes or crackles Cardiovascular: Regular rate and rhythm without murmur gallop or rub normal  S1 and S2 Abdomen: Nontender, nondistended, soft, bowel sounds positive, no rebound, no ascites, no appreciable mass Extremities: No significant cyanosis, clubbing, or edema bilateral lower extremities  Data Reviewed: Basic Metabolic Panel:  Lab  03/30/12 1939  NA 138  K 4.1  CL 105  CO2 21  GLUCOSE 143*  BUN 17  CREATININE 0.90  CALCIUM 9.3  MG 2.1  PHOS --   Liver Function Tests:  Lab 03/30/12 1939  AST 29  ALT 19  ALKPHOS 140*  BILITOT 0.2*  PROT 7.6  ALBUMIN 3.7   CBC:  Lab 03/30/12 1939  WBC 4.0  NEUTROABS --  HGB 12.2*  HCT 39.3  MCV 78.9  PLT 327    Recent Results (from the past 240 hour(s))  MRSA PCR SCREENING     Status: Normal   Collection Time   03/31/12  3:47 PM      Component Value Range Status Comment   MRSA by PCR NEGATIVE  NEGATIVE Final      Studies:  Recent x-ray studies have been reviewed in detail by the Attending Physician  Scheduled Meds:  Reviewed in detail by the Attending Physician   Lonia Blood, MD [facility] Office  [phone] Pager [phone]  On-Call/Text Page:      [email or web address]      password [email or web address]  If 7PM-7AM, please contact night-coverage [email or web address] Password [email or web address] 04/03/2012, 1:43 PM   LOS: 8 days

## 2012-04-03 NOTE — Progress Notes (Signed)
Subjective: Patient had seizure lasting 5 - 6 minutes this morning. Got ativan 2mg  x1. Might have also gotten a second dose of 2mg , and this is documented, but there is some discrepancy as to whether he got the second dose.   Exam: Filed Vitals:   04/03/12 0726  BP: 105/69  Pulse:   Temp: 97.7 F (36.5 C)  Resp: 14   Gen: In bed, NAD MS: Awakens easily, responds to questions and commands, oriented to person and month, but not place.  ZD:GUYQI, Able to track across midline in both directions.  Motor: moves all extremities well Sensory:responds to stimuli   Impression: 31 yo with refractory epilepsy. He has been difficult to control in the past, and we may not get him to seizure freedom with this hospitalization, however given his propensity for status epilepticus, I would favor at least a few days of seizure freedom prior to discharge. His seizure did abort after 5 -6 minutes this time which is better than previous breakthrough.   Recommendations: 1)Increase topamax to 75mg  BID, can increase to 100mg  BID in 5 days.  2) Continue vimpat 300mg  BID, dilantin 200mg  BID, and keppra 2gm BID.  3) Once on theraputic dose of topamax and controlled from sz perspective, would plan on decreasing vimpat.   Ritta Slot, MD [facility] [phone]  If 7pm- 7am, please page neurology on call at [phone].

## 2012-04-03 NOTE — Consult Note (Signed)
Reason for Consult: Depression, Seizure Disorder  Referring Physician: Dr. Sharon Seller   Date: 04/03/2012  [name] is an 31 y.o. male.   HPI: [name] is a 31 y/o male with a past psychiatric history for Alcohol Abuse and question of dependence of pain medications. The patient had a seizure this morning and therefore is still receiving inpatient medical treatment.  Patient denies current suicidal ideation, intent, or plan. Patient denies current homicidal ideation, intent, or plan. Patient denies auditory hallucinations. Patient denies visual hallucinations. Patient denies symptoms of paranoia. Patient states sleep has was good. Appetite is fair. Energy level is low.   The patient reported that his stepfather would be here today at noon, and thus this provider had be present, but no family members have come.  Staff further reports no previous family visits at the patient's last inpatient admission, and therefore his actually level of social support is questionable.   Past Psychiatric History:  Medications: Patient denies being tried on any previous antidepressants.  Outpatient psychiatric treatment: Patient denies.  Inpatient psychiatric treatment: One admission 03/2012  Suicide attempts: Patient denies  Past Medical History  Diagnosis Date  . Seizure   . Protein C deficiency   . Protein C deficiency     Past Surgical History  Procedure Date  . Insertion of vena cava filter     History reviewed. No pertinent family history.  Social History:  reports that he has been smoking Cigarettes.  He has a 1.25 pack-year smoking history. He has never used smokeless tobacco. He reports that he drinks about 3.6 ounces of alcohol per week. He reports that he uses illicit drugs (Marijuana).  Allergies:  Allergies  Allergen Reactions  . Fish Allergy Anaphylaxis and Rash  . Depakote (Divalproex Sodium) Swelling  . Morphine And Related Hives    Pt tolerated IV morphine 03/25/2012  admission. Pt tolerated PO morphine 03/29/2012.    Medications: I have reviewed the patient's current medications.    . docusate sodium  100 mg Oral BID  . folic acid  1 mg Oral Daily  . lacosamide  300 mg Oral BID  . levETIRAcetam  2,000 mg Oral BID  . phenytoin  200 mg Oral BID  . Rivaroxaban  15 mg Oral BID  . thiamine  100 mg Oral Daily  . topiramate  75 mg Oral BID  . [DISCONTINUED] topiramate  50 mg Oral BID     No results found for this or any previous visit (from the past 48 hour(s)).  No results found.  Review of Systems  Constitutional: Negative for fever, chills, weight loss, malaise/fatigue and diaphoresis.  Respiratory: Negative for cough, shortness of breath and wheezing.   Cardiovascular: Negative for chest pain and palpitations.  Gastrointestinal: Negative for vomiting, diarrhea and constipation.  Neurological: Positive for seizures and weakness.   Blood pressure 131/95, pulse 74, temperature 97.7 F (36.5 C), temperature source Oral, resp. rate 15, height 5\' 8"  (1.727 m), weight 69.5 kg (153 lb 3.5 oz), SpO2 97.00%.  Physical Exam  Vitals reviewed. Constitutional: He appears well-developed and well-nourished.  Skin: He is not diaphoretic.    Mental Status Examination/Evaluation:  Objective: Appearance: Casual, Disheveled, with eyes closed.  Eye Contact: poor  Speech: Clear and Coherent   Volume: Normal   Mood: "tired and aggavated"   Affect: Blunt   Thought Process: Coherent, Linear   Orientation: Patient is somnolent, and oriented to person, and place  Thought Content: WDL   Suicidal Thoughts: Patient denies suicidal  ideation, intent or plans.   Homicidal Thoughts: Patient denies homicidal ideation, intent or plans.   Judgement: intact   Insight: Absent, Intact   Psychomotor Activity: Decreased  Akathisia: None   Memory: Recent and remote intact   Assets: Desire for Improvement  Financial Resources/Insurance  Housing  Talents/Skills    Transportation  Vocational/Educational    Assessment/Plan:  Axis I Clinical Disorder:  Major Depression, severe, recurrent without psychotic features. Alcohol Dependence, rule out opiate dependence.  Axis II : No Diagnosis  Axis III : Seizure Disorder  Axis IV Current Psychosocial Stressors: primary support group, Other psychosocial and environmental problems: death of ex-wife and 36 y/o daughter  Axis V GAF Score (current level of functioning): 48  PLAN:  1. Patient denies current suicidal or homicidal ideation, intent or plans.  2. Again, recommend initiation of an antidepressant for this patient.  a) May use sertraline 25-50 mg or escitalopram 10-20 mg, given his current medications.  3.Reviewed patient's labs.  4. Please discuss risks and benefits, side effects and alternatives with patient, if started on an antidepressant.  5. Have not been able to reach patient's stepfather with current number, the patient reported his family would come in today at noon, but no family has come. Will need to discuss patient's condition with the family prior to discharge. 6. Again would recommend setting up IOP appointments for patient, AFTER inpatient treatment is completed.   Serayah Yazdani 04/03/2012, 11:53 AM

## 2012-04-03 NOTE — Progress Notes (Signed)
Utilization review completed.  

## 2012-04-03 NOTE — Progress Notes (Signed)
Pt. Had seizure lasting about 5-6 minutes; was post-ictal for about 20 and then had a second seizure lasting about 1 min.  He is currently post-ictal.  Vitals all stable bp 109/65 hr 78 rr 20 o2 100%.  Ativan intramuscular 2mg  given twice; both [facility] and neuro[facility]ist aware.  Will continue to monitor.  Vivi Martens RN

## 2012-04-04 DIAGNOSIS — F1020 Alcohol dependence, uncomplicated: Secondary | ICD-10-CM

## 2012-04-04 MED ORDER — LORAZEPAM 1 MG PO TABS
1.0000 mg | ORAL_TABLET | Freq: Three times a day (TID) | ORAL | Status: DC
  Administered 2012-04-04 – 2012-04-06 (×7): 1 mg via ORAL
  Filled 2012-04-04 (×7): qty 1

## 2012-04-04 MED ORDER — HYDROMORPHONE HCL PF 1 MG/ML IJ SOLN
INTRAMUSCULAR | Status: AC
  Filled 2012-04-04: qty 2

## 2012-04-04 MED ORDER — LACOSAMIDE 200 MG PO TABS
200.0000 mg | ORAL_TABLET | Freq: Two times a day (BID) | ORAL | Status: DC
  Administered 2012-04-04 – 2012-04-09 (×10): 200 mg via ORAL
  Filled 2012-04-04 (×11): qty 1

## 2012-04-04 MED ORDER — LACOSAMIDE 50 MG PO TABS
100.0000 mg | ORAL_TABLET | Freq: Two times a day (BID) | ORAL | Status: DC
  Administered 2012-04-04 – 2012-04-13 (×20): 100 mg via ORAL
  Filled 2012-04-04 (×10): qty 2
  Filled 2012-04-04: qty 4
  Filled 2012-04-04 (×12): qty 2

## 2012-04-04 MED ORDER — LEVETIRACETAM 750 MG PO TABS
2000.0000 mg | ORAL_TABLET | Freq: Two times a day (BID) | ORAL | Status: DC
  Administered 2012-04-04 – 2012-04-20 (×32): 2000 mg via ORAL
  Filled 2012-04-04 (×37): qty 1

## 2012-04-04 MED ORDER — HYDROMORPHONE HCL PF 1 MG/ML IJ SOLN
2.0000 mg | Freq: Once | INTRAMUSCULAR | Status: AC
  Administered 2012-04-04: 2 mg via INTRAVENOUS

## 2012-04-04 MED ORDER — LEVETIRACETAM 750 MG PO TABS
2000.0000 mg | ORAL_TABLET | Freq: Once | ORAL | Status: AC
  Administered 2012-04-04: 2000 mg via ORAL
  Filled 2012-04-04: qty 1

## 2012-04-04 NOTE — Progress Notes (Signed)
Patient had 1st seizure at 0712, lasting 1 1/2 minutes, 2 mg IV ativan given  BP 134/82, 2nd seizure at 0720, lasting around 6 minutes, Dr. Amada Jupiter (Neuro) orders given for a total of 4 ativan mg. B P 143/78 at.0731. Patient confused. New orders obtained from MD. Will continue to monitor patient.

## 2012-04-04 NOTE — Consult Note (Signed)
Reason for Consult: Depression, Seizure Disorder  Referring Physician: Dr. Sharon Seller   Date: 04/04/2012  [name] [name] is an 31 y.o. male.   HPI: [name] [name] is a 31 y/o male with a past psychiatric history for Alcohol Abuse and question of dependence of pain medications. Staff reports patients had seizures again this morning and therefore is still receiving inpatient medical treatment.  He was questioned about the amount of alcohol he had used prior to admission and he denied regular use and admits to only excessive use the night before he was admitted to the hospital while he was out with his friends.   In the area of affective symptoms patient appears depressed.  He admits to depression which started with the death of his mother several years ago which steadily increased in the months following his wife's and daughter's death this past 2022/12/14. Patient denies current suicidal ideation, intent, or plan. Patient denies current homicidal ideation, intent, or plan. Patient denies auditory hallucinations. Patient denies visual hallucinations. Patient denies symptoms of paranoia. Patient states sleep has was nonrestorative last night, but reports he was able to sleep. Appetite is poor. Energy level is low.   The patient reports his stepfather came to see the patient last night.  I have still not been able to contact any family or friends for collateral information. He states his friend Thayer Ohm from [place] had advised the patient to seek psychiatric treatment, but the patient does not have his number.  Past Psychiatric History: Reviewed Medications: Patient denies being tried on any previous antidepressants.  Outpatient psychiatric treatment: Patient denies.  Inpatient psychiatric treatment: One admission 03/2012  Suicide attempts: Patient denies  Past Medical History  Diagnosis Date  . Seizure   . Protein C deficiency   . Protein C deficiency     Past Surgical History  Procedure Date  . Insertion  of vena cava filter     History reviewed. No pertinent family history.  Social History:  reports that he has been smoking Cigarettes.  He has a 1.25 pack-year smoking history. He has never used smokeless tobacco. He reports that he drinks about 3.6 ounces of alcohol per week. He reports that he uses illicit drugs (Marijuana).  Allergies:  Allergies  Allergen Reactions  . Fish Allergy Anaphylaxis and Rash  . Depakote (Divalproex Sodium) Swelling  . Morphine And Related Hives    Pt tolerated IV morphine 03/25/2012 admission. Pt tolerated PO morphine 03/29/2012.    Medications: I have reviewed the patient's current medications.    . docusate sodium  100 mg Oral BID  . folic acid  1 mg Oral Daily  . HYDROmorphone      . [COMPLETED]  HYDROmorphone (DILAUDID) injection  2 mg Intravenous Once  . lacosamide  100 mg Oral BID  . lacosamide  200 mg Oral BID  . levETIRAcetam  2,000 mg Oral Q12H  . [COMPLETED] levETIRAcetam  2,000 mg Oral Once  . LORazepam  1 mg Oral Q8H  . phenytoin  200 mg Oral BID  . Rivaroxaban  15 mg Oral BID  . sertraline  50 mg Oral Daily  . thiamine  100 mg Oral Daily  . topiramate  75 mg Oral BID  . [DISCONTINUED] lacosamide  300 mg Oral BID  . [DISCONTINUED] levETIRAcetam  2,000 mg Oral BID     No results found for this or any previous visit (from the past 48 hour(s)).  No results found.  Review of Systems  Constitutional: Negative for fever, chills,  weight loss, malaise/fatigue and diaphoresis.  Respiratory: Negative for cough, shortness of breath and wheezing.   Cardiovascular: Negative for chest pain and palpitations.  Gastrointestinal: Negative for vomiting, diarrhea and constipation.  Neurological: Positive for seizures and weakness.   Blood pressure 125/72, pulse 73, temperature 97.8 F (36.6 C), temperature source Oral, resp. rate 21, height 5\' 8"  (1.727 m), weight 70.9 kg (156 lb 4.9 oz), SpO2 96.00%.  Physical Exam  Vitals  reviewed. Constitutional: He appears well-developed and well-nourished.  Skin: He is not diaphoretic.    Mental Status Examination/Evaluation:  Objective: Appearance: Casual, Disheveled, sitting up in bed with hiccups  Eye Contact: poor, has eyes closed most of the interview  Speech: Clear and Coherent   Volume: Normal   Mood: "not too good"   Affect: Blunt   Thought Process: Coherent, Linear   Orientation: Patient is somnolent, and oriented to person, and place  Thought Content: WDL   Suicidal Thoughts: Patient denies suicidal ideation, intent or plans.   Homicidal Thoughts: Patient denies homicidal ideation, intent or plans.   Judgement: intact   Insight: Absent, Intact   Psychomotor Activity: Decreased  Akathisia: None   Memory: Recent and remote intact   Assets: Desire for Improvement  Financial Resources/Insurance  Housing  Talents/Skills  Transportation  Vocational/Educational    Assessment/Plan:  Axis I Clinical Disorder:  Major Depression, severe, recurrent without psychotic features. Alcohol Dependence, rule out opiate dependence.  Axis II : No Diagnosis  Axis III : Seizure Disorder  Axis IV Current Psychosocial Stressors: primary support group, Other psychosocial and environmental problems: death of ex-wife and 47 y/o daughter  Axis V GAF Score (current level of functioning): 48  PLAN:  1. Patient denies current suicidal or homicidal ideation, intent or plans.  2. Again, recommend initiation of an antidepressant for this patient.  a) Continue sertraline 50 mg daily. 3.Reviewed patient's labs.  4. Have not been able to reach patient's stepfather or other family members. If LCSW is available to reach family to obtain collateral information, this would be helpful in determining best discharge option for the patient. 5. Again would recommend setting up IOP appointments for patient, AFTER inpatient treatment is completed.  6. Psychiatry will follow this patient, and  assess need for further inpatient treatment prior to patient's discharge.   Rion Catala 04/04/2012, 12:23 PM

## 2012-04-04 NOTE — Progress Notes (Signed)
[facility] Progress Note [place] TEAM 1 - Stepdown/ICU TEAM   [name] [name] ZOX:[id number] DOB: 1980-10-08 DOA: 03/26/2012 PCP: Sheila Oats, MD  Brief narrative: 31 year old man admitted for refractory epilepsy from [facility] where he was treated for depression, SI. Hospitalization prolonged secondary to recurrent seizures despite 4 antiepileptics. IVC renewed 11/25 per Psychiatry for depression, SI.  Although stable after prolonged seizure 11/25 PM, access remained a problem, seizure severity seemed to be worsening overall, and patient continued to intermittently and unpredictably refuse care. Patient had not gone >24 hours without a seizure since admission up to 11/26. He remained under IVC. Until seizures are controlled it was thought prudent to transfer to [facility] where Neurology is based. .  11/20 4 recorded seizures  11/21 3 recorded seizures  11/22 1 recorded seizure  11/23 5 recorded seizures  11/24 2+ recorded seizures  11/25 seizure complex ~40 minutes requiring 8 mg IM Ativan   Assessment/Plan:  Seizure disorder / refractory epilepsy no seizure since ~7:30 pm 11/25 until 6:30am 11/29 when pt had 1 vs/ 2 seizures, the first of which lasted 5-6 minutes, arrested by IM ativan - again, pt had a seizure this AM requiring a total of 6 mg of ativan for abortion - management per Neurology - would like at least 48hrs seizure free before cleared for d/c to next venue - meds adjusted such that dosing is closer to early AM - ativan being given on schedule now   Major depressive disorder/SI per Psychiatry who continues to follow the patient - unfortunately, it appears that the pt does not have involved family as he previously reported - as a result, Psych is suggesting that pt remain under IVC and that we pursue inpt psych hospitalization once cleared medically (which will now be no sooner than Monday) - I have begun sertraline at the suggestion of Pscyh  DVT RUE Xarelto - patient  has refused lab draws, therefore not a warfarin candidate  Difficult IV access unable to establish peripheral IV despite multiple attempts 11/24 - per Dr. 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 Could not access a collateral vein in right neck. Attempt made to access left brachial vein above elbow. [Given DVT RUE] may have to use femoral vein in future for access." - at present there is no absolute requirement for an IV (seiz has been arrested previously w/ IM ativan)  Extensive dental caries per Dr. Kristin Bruins, patient needs outpatient oral surgeon - Dr. Florene Route as outpatient for possible dental extraction for dental caries after transfer to behavioral health - appointment with Dr. Florene Route can be arranged at [phone].  Alcohol abuse  Well beyond the window for withdrawal   Protein C deficiency  On chronic anticoag  Report of drug seeking behavior patient has reported various outpatient regimens - cont current tx plan w/o change  Code Status: FULL Disposition Plan: remain on SDU due to active seizure d/o w/ risk of status epilepticus Consultants: Neuro Psych  Procedures: none  Antibiotics: none  DVT prophylaxis: xarelto  HPI/Subjective: Pt is lethargic/post-ictal again, but able to answer questions.  He reports that he "hurts all over" and has a HA.  He denies cp, sob, f/c, or n/v.   Objective: Blood pressure 118/65, pulse 73, temperature 98 F (36.7 C), temperature source Oral, resp. rate 16, height 5\' 8"  (1.727 m), weight 70.9 kg (156 lb 4.9 oz), SpO2 95.00%.  Intake/Output Summary (Last 24 hours) at 04/04/12 0937 Last data filed at 04/03/12 1900  Gross  per 24 hour  Intake    600 ml  Output    800 ml  Net   -200 ml     Exam: General: No acute respiratory distress - lethargic  Lungs: Clear to auscultation bilaterally without wheezes or crackles Cardiovascular: Regular rate and rhythm without murmur gallop or rub  Abdomen:  Nontender, nondistended, soft, bowel sounds positive, no rebound, no ascites, no appreciable mass Extremities: No significant cyanosis, clubbing, or edema bilateral lower extremities  Data Reviewed: Basic Metabolic Panel:  Lab 03/30/12 1610  NA 138  K 4.1  CL 105  CO2 21  GLUCOSE 143*  BUN 17  CREATININE 0.90  CALCIUM 9.3  MG 2.1  PHOS --   Liver Function Tests:  Lab 03/30/12 1939  AST 29  ALT 19  ALKPHOS 140*  BILITOT 0.2*  PROT 7.6  ALBUMIN 3.7   CBC:  Lab 03/30/12 1939  WBC 4.0  NEUTROABS --  HGB 12.2*  HCT 39.3  MCV 78.9  PLT 327    Recent Results (from the past 240 hour(s))  MRSA PCR SCREENING     Status: Normal   Collection Time   03/31/12  3:47 PM      Component Value Range Status Comment   MRSA by PCR NEGATIVE  NEGATIVE Final      Studies:  Recent x-ray studies have been reviewed in detail by the Attending Physician  Scheduled Meds:  Reviewed in detail by the Attending Physician   Lonia Blood, MD [facility] Office  [phone] Pager [phone]  On-Call/Text Page:      [email or web address]      password [email or web address]  If 7PM-7AM, please contact night-coverage [email or web address] Password [email or web address] 04/04/2012, 9:37 AM   LOS: 9 days

## 2012-04-04 NOTE — Progress Notes (Signed)
Subjective: Patient had another breakthrough seizure around 7 am this morning.   Exam: Filed Vitals:   04/04/12 0358  BP:   Pulse:   Temp: 98 F (36.7 C)  Resp:    Initially patient has generalized convulsive activity with both a tonic phase followed by a short clonic phase. He subsequently briefly stops. In the interim he has some eyelid fluttering, then his eyes and head turn towards the right and he again has bilateral arm extension followed by clonic activity.   This was aborted with IM ativan. Following this, he awakens and is post-ictal, but able to converse.   Gen: In bed, NAD MS: Awakens after ativan, becomes defensive when stated that he refuses blood draws.  ZO:XWRUE, fixates and tracks.  Motor: moves all extremities purposefully.  Sensory:intact to LT.   Impression: 31 yo M with refractory epilepsy. For the past two days, his breakthrough have been in the early morning. I will stagger his AED dosing so he gets his keppra at 6am. Also, as we are titrating up on topamax will start scheduled ativan.  Recommendations: 1)Change keppra scheduling 2) Start ativan 1mg  PO TID 3) Keppra 2gm BID 4) Vimpat 300mg  BID 5) Dilantin 200mg  BID 6) Continue topamax 75mg  BID, increase in 4 days to 100mg  BID.   Ritta Slot, MD [facility] [phone]  If 7pm- 7am, please page neurology on call at [phone].

## 2012-04-05 NOTE — Progress Notes (Signed)
Patient was scheduled for a BMET per pharmacy, Patient refused lab draw, RN explained rationale of the lab draw with the medication he takes. Patient still refusing to be stuck.

## 2012-04-05 NOTE — Consult Note (Signed)
Reason for Consult: Depression, Seizure Disorder  Referring Physician: Dr. Sharon Seller   Date: 04/04/2012  [name] [name] is an 31 y.o. male.   HPI: [name] [name] is a 31 y/o male with a past psychiatric history for Major Depression, severe, recurrent without psychotic features. Alcohol Dependence, rule out opiate dependence. Staff reports patients had no further seizures this morning. The patient has not had any family visit and therefore will request that patient's phone is brought up from security, to get collateral information from his friend Thayer Ohm and his Stepfather.  After receiving his personal belonging the patient stated he would not allow this provider to speak to his stepfather or friend, Thayer Ohm.  In the area of affective symptoms patient appears depressed. Patient denies current suicidal ideation, intent, or plan. Patient denies current homicidal ideation, intent, or plan. Patient denies auditory hallucinations. Patient denies visual hallucinations. Patient denies symptoms of paranoia. Patient states sleep is fair. Appetite is fair. Energy level is low.   NO symptoms consistent with mania or hypomania. No symptoms consistent with psychosis.   Past Psychiatric History: Reviewed Medications: Patient denies being tried on any previous antidepressants.  Outpatient psychiatric treatment: Patient denies.  Inpatient psychiatric treatment: One admission 03/2012  Suicide attempts: Patient denies  Past Medical History  Diagnosis Date  . Seizure   . Protein C deficiency   . Protein C deficiency     Past Surgical History  Procedure Date  . Insertion of vena cava filter     History reviewed. No pertinent family history.  Social History:  reports that he has been smoking Cigarettes.  He has a 1.25 pack-year smoking history. He has never used smokeless tobacco. He reports that he drinks about 3.6 ounces of alcohol per week. He reports that he uses illicit drugs (Marijuana).  Allergies:    Allergies  Allergen Reactions  . Fish Allergy Anaphylaxis and Rash  . Depakote (Divalproex Sodium) Swelling  . Morphine And Related Hives    Pt tolerated IV morphine 03/25/2012 admission. Pt tolerated PO morphine 03/29/2012.    Medications: I have reviewed the patient's current medications.    . docusate sodium  100 mg Oral BID  . folic acid  1 mg Oral Daily  . [EXPIRED] HYDROmorphone      . lacosamide  100 mg Oral BID  . lacosamide  200 mg Oral BID  . levETIRAcetam  2,000 mg Oral Q12H  . LORazepam  1 mg Oral Q8H  . phenytoin  200 mg Oral BID  . Rivaroxaban  15 mg Oral BID  . sertraline  50 mg Oral Daily  . thiamine  100 mg Oral Daily  . topiramate  75 mg Oral BID   acetaminophen, acetaminophen, LORazepam, morphine, nicotine polacrilex, ondansetron (ZOFRAN) IV, ondansetron   No results found for this or any previous visit (from the past 48 hour(s)).  No results found.  Review of Systems  Constitutional: Negative for fever, chills, weight loss, malaise/fatigue and diaphoresis.  Respiratory: Negative for cough, shortness of breath and wheezing.   Cardiovascular: Negative for chest pain and palpitations.  Gastrointestinal: Negative for vomiting, diarrhea and constipation.  Neurological: Positive for weakness.   Blood pressure 109/67, pulse 68, temperature 97.5 F (36.4 C), temperature source Oral, resp. rate 16, height 5\' 8"  (1.727 m), weight 71.6 kg (157 lb 13.6 oz), SpO2 96.00%.  Physical Exam  Vitals reviewed. Constitutional: He appears well-developed and well-nourished.  Skin: He is not diaphoretic.    Mental Status Examination/Evaluation:  Objective: Appearance: Casual, Disheveled  Eye Contact:poor  Speech: Clear and Coherent   Volume: Normal   Mood: "okay"   Affect: Full range.   Thought Process: Coherent, Linear   Orientation:    Alert and oriented to person, place, and time  Thought Content: WDL   Suicidal Thoughts: Patient denies suicidal ideation,  intent or plans.   Homicidal Thoughts: Patient denies homicidal ideation, intent or plans.   Judgement:Poor  Insight: Poor  Psychomotor Activity: Decreased  Akathisia:   Memory: Recent and remote intact   Assets: Desire for Improvement  Financial Resources/Insurance  Housing  Talents/Skills  Transportation  Vocational/Educational    Assessment/Plan:  Axis I Clinical Disorder:  Major Depression, severe, recurrent without psychotic features. Alcohol Dependence, rule out opiate dependence.  Axis II : No Diagnosis  Axis III : Seizure Disorder  Axis IV Current Psychosocial Stressors: primary support group, Other psychosocial and environmental problems: death of ex-wife and 30 y/o daughter  Axis V GAF Score (current level of functioning): 30  PLAN:  1. The patient has now refused to allow this provider to speak to his stepfather and friend, Thayer Ohm, for collateral information and therefore, given the lack of collateral information, would recommend further inpatient psychiatric treatment after medical stabilization. The patient is currently under IVC, would renew this if needed to prevent patient to leaving without completing psychiatric treatment.  2. Again, recommend initiation of an antidepressant for this patient.  a) Continue sertraline 50 mg daily. 3.Reviewed patient's labs.  4.Will have patient retrieve phone numbers for his father and Thayer Ohm his friend, 5. Again would recommend setting up IOP appointments for patient, AFTER inpatient treatment is completed.  6. Psychiatry will follow this patient.  Kori Colin 04/05/2012, 11:29 AM

## 2012-04-05 NOTE — Progress Notes (Signed)
Subjective: No further seizures overnight, complains of some pain after seizures. Complains of being sleepy.   Exam: Filed Vitals:   04/05/12 0800  BP: 109/67  Pulse: 68  Temp: 97.5 F (36.4 C)  Resp:    Gen: In bed, NAD MS: Awake, Alert, Oriented. Able to  ZO:XWRUE, EOMI Motor: 5/5 without drift   Impression: 31 yo M with refractory epilepsy. As we are titrating topiramate, will continue ativan. His scheduling has been changed to allow for an earlier dose of keppra to try to avoid early mornign seizures.   Recommendations: 1) Keppra 2gm q12h 2) Continue ativan, could decrease dose tomorrow if he continues to complains of sleepiness.  3) vimpat 300mg  BID 4) dilantin 200mg  BID 6) Continue topamax 75mg  BID, increase in 3 days to 100mg  BID.    Ritta Slot, MD [facility] [phone]  If 7pm- 7am, please page neurology on call at [phone].

## 2012-04-05 NOTE — Progress Notes (Signed)
[facility] Progress Note [place] TEAM 1 - Stepdown/ICU TEAM   Catcher Xxx-Sinquefield ZOX:[id number] DOB: [birth year] DOA: 03/26/2012 PCP: Sheila Oats, MD  Brief narrative: 31 year old man admitted for refractory epilepsy from [facility] where he was treated for depression, SI. Hospitalization prolonged secondary to recurrent seizures despite 4 antiepileptics. IVC renewed 11/25 per Psychiatry for depression, SI.  Although stable after prolonged seizure 11/25 PM, access remained a problem, seizure severity seemed to be worsening overall, and patient continued to intermittently and unpredictably refuse care. Patient had not gone >24 hours without a seizure since admission up to 11/26. He remained under IVC. Until seizures are controlled it was thought prudent to transfer to [facility] where Neurology is based. .  11/20 4 recorded seizures  11/21 3 recorded seizures  11/22 1 recorded seizure  11/23 5 recorded seizures  11/24 2+ recorded seizures  11/25 seizure complex ~40 minutes requiring 8 mg IM Ativan  11/29 - 0630am - seizure 11/30 - 07:00am seizure x2  Assessment/Plan:  Seizure disorder / refractory epilepsy no seizure since ~7:30 pm 11/25 until 6:30am 11/29 when pt had 1 vs/ 2 seizures, the first of which lasted 5-6 minutes, arrested by IM ativan - again, had a seizure 11/30 AM requiring a total of 6 mg of ativan for abortion - management per Neurology - meds adjusted such that dosing is closer to early AM - ativan being given on schedule now - will need 72hrs seiz free prior to clearing for d/c home   Major depressive disorder/SI per Psychiatry who continues to follow the patient - unfortunately, it appears that the pt does not have involved family as he previously reported - as a result, Psych is suggesting that pt remain under IVC and that we pursue inpt psych hospitalization once cleared medically (which will now be no sooner than Monday) - sertraline dosing at the suggestion of  Pscyh  DVT RUE Xarelto - patient has refused lab draws, therefore not a warfarin candidate  Difficult IV access unable to establish peripheral IV despite multiple attempts 11/24 - per Dr. 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 Could not access a collateral vein in right neck. Attempt made to access left brachial vein above elbow. [Given DVT RUE] may have to use femoral vein in future for access." - at present there is no absolute requirement for an IV (seiz has been arrested previously w/ IM ativan)  Extensive dental caries per Dr. Kristin Bruins, patient needs outpatient oral surgeon - Dr. Florene Route as outpatient for possible dental extraction for dental caries after transfer to behavioral health - appointment with Dr. Florene Route can be arranged at [phone].  Alcohol abuse  Well beyond the window for withdrawal   Protein C deficiency  On chronic anticoag  Report of drug seeking behavior patient has reported various outpatient regimens - cont current tx plan w/o change  Code Status: FULL Disposition Plan: remain on SDU due to active seizure d/o w/ risk of status epilepticus Consultants: Neuro Psych  Procedures: none  Antibiotics: none  DVT prophylaxis: xarelto  HPI/Subjective: Alert and repsonsive.  C/o feeling "like shit all over."  Denies sob, f/c, n/v.   Objective: Blood pressure 112/69, pulse 68, temperature 98.1 F (36.7 C), temperature source Oral, resp. rate 15, height 5\' 8"  (1.727 m), weight 71.6 kg (157 lb 13.6 oz), SpO2 96.00%.  Intake/Output Summary (Last 24 hours) at 04/05/12 1657 Last data filed at 04/05/12 1600  Gross per 24 hour  Intake    480 ml  Output   2600 ml  Net  -2120 ml     Exam: General: No acute respiratory distress  Lungs: Clear to auscultation bilaterally without wheezes or crackles Cardiovascular: Regular rate and rhythm without murmur gallop or rub  Abdomen: Nontender, nondistended, soft, bowel sounds  positive, no rebound, no ascites, no appreciable mass Extremities: No significant cyanosis, clubbing, or edema bilateral lower extremities  Data Reviewed: Basic Metabolic Panel:  Lab 03/30/12 1610  NA 138  K 4.1  CL 105  CO2 21  GLUCOSE 143*  BUN 17  CREATININE 0.90  CALCIUM 9.3  MG 2.1  PHOS --   Liver Function Tests:  Lab 03/30/12 1939  AST 29  ALT 19  ALKPHOS 140*  BILITOT 0.2*  PROT 7.6  ALBUMIN 3.7   CBC:  Lab 03/30/12 1939  WBC 4.0  NEUTROABS --  HGB 12.2*  HCT 39.3  MCV 78.9  PLT 327    Recent Results (from the past 240 hour(s))  MRSA PCR SCREENING     Status: Normal   Collection Time   03/31/12  3:47 PM      Component Value Range Status Comment   MRSA by PCR NEGATIVE  NEGATIVE Final      Studies:  Recent x-ray studies have been reviewed in detail by the Attending Physician  Scheduled Meds:  Reviewed in detail by the Attending Physician   Lonia Blood, MD [facility] Office  [phone] Pager [phone]  On-Call/Text Page:      [email or web address]      password [email or web address]  If 7PM-7AM, please contact night-coverage [email or web address] Password [email or web address] 04/05/2012, 4:57 PM   LOS: 10 days

## 2012-04-06 MED ORDER — HYDROMORPHONE HCL PF 1 MG/ML IJ SOLN
1.0000 mg | Freq: Once | INTRAMUSCULAR | Status: AC
  Administered 2012-04-06: 1 mg via INTRAMUSCULAR
  Filled 2012-04-06: qty 1

## 2012-04-06 MED ORDER — LORAZEPAM 0.5 MG PO TABS
0.7500 mg | ORAL_TABLET | Freq: Three times a day (TID) | ORAL | Status: DC
  Administered 2012-04-06 – 2012-04-08 (×8): 0.75 mg via ORAL
  Administered 2012-04-09: 0.5 mg via ORAL
  Administered 2012-04-09: 0.75 mg via ORAL
  Administered 2012-04-09: 0.5 mg via ORAL
  Administered 2012-04-10 – 2012-04-11 (×4): 0.75 mg via ORAL
  Filled 2012-04-06 (×4): qty 2
  Filled 2012-04-06: qty 1
  Filled 2012-04-06: qty 2
  Filled 2012-04-06: qty 1
  Filled 2012-04-06 (×7): qty 2
  Filled 2012-04-06: qty 1
  Filled 2012-04-06 (×2): qty 2

## 2012-04-06 MED ORDER — LORAZEPAM 2 MG/ML IJ SOLN: 2.0000 mg | Freq: Once | INTRAMUSCULAR | Status: AC

## 2012-04-06 NOTE — Progress Notes (Signed)
Patient Identification:  [name] [name] Date of Evaluation:  04/06/2012 Reason for Consult:  Follow up Consultation  Congenital Seizures  Referring Provider: Dr.Mc Clung  History of Present Illness:Pt was transferred from [facility] after initial seizure to [facility]  He was followed by Neurology and was finally transferred to [facility] to have continue follow by neurology and medicine.  Pt continues to have seizures as anticonvulsant medication is slowing titrated.  Past Psychiatric History:  History of non-adherence to medication / treatment regimen, hitsory of pain medication seeking.    Past Medical History:     Past Medical History  Diagnosis Date  . Seizure   . Protein C deficiency   . Protein C deficiency        Past Surgical History  Procedure Date  . Insertion of vena cava filter     Allergies:  Allergies  Allergen Reactions  . Fish Allergy Anaphylaxis and Rash  . Depakote (Divalproex Sodium) Swelling  . Morphine And Related Hives    Pt tolerated IV morphine 03/25/2012 admission. Pt tolerated PO morphine 03/29/2012.    Current Medications:  Prior to Admission medications   Medication Sig Start Date End Date Taking? Authorizing Provider  lacosamide 100 MG TABS Take 1 tablet (100 mg total) by mouth 2 (two) times daily. 03/16/12  Yes Meredeth Ide, MD  levETIRAcetam (KEPPRA) 750 MG tablet Take 2 tablets (1,500 mg total) by mouth 2 (two) times daily. 03/16/12  Yes Meredeth Ide, MD  phenytoin (DILANTIN) 100 MG ER capsule Take 2 capsules (200 mg total) by mouth 2 (two) times daily. 03/16/12  Yes Meredeth Ide, MD  rivaroxaban (XARELTO) 10 MG TABS tablet Take 20 mg by mouth 2 (two) times daily.   Yes Historical Provider, MD  traMADol (ULTRAM) 50 MG tablet Take 1 tablet (50 mg total) by mouth every 6 (six) hours as needed. 03/16/12  Yes Meredeth Ide, MD    Social History:    reports that he has been smoking Cigarettes.  He has a 1.25 pack-year smoking history. He has never used smokeless  tobacco. He reports that he drinks about 3.6 ounces of alcohol per week. He reports that he uses illicit drugs (Marijuana).   Family History:    History reviewed. No pertinent family history.  Mental Status Examination/Evaluation: Objective:  Appearance: immobile with painful expression  "I'm bad after yesterday's seizures"  Eye Contact::  Fair  Speech:  Slow  Volume:  Decreased  Mood:  depressed  Affect:  Congruent, Constricted and Depressed  Thought Process:  Coherent  Orientation:  Full  Thought Content:  discomfort  Suicidal Thoughts:  No  Homicidal Thoughts:  No  Judgement:  Fair  Insight:  Fair   DIAGNOSIS:   AXIS I   Acute Stress due to recurrent congenital Seizures,  Medication dependence  AXIS II  Deferred  AXIS III See medical notes.  AXIS IV other psychosocial or environmental problems, problems related to social environment and problesm adhering to meication regimen  AXIS V 51-60 moderate symptoms   Assessment/Plan:  Discussed with Psych CSW Pt is in bed.  Pt has eyes partially closed with painful expression.  He says he has been uncomfortable all day.  He has a bad headache after the day of multiple seizures.  He denies suicidal ideation.  Noted:  Pt insisted on leaving last night  AMA.   Pt has IVC that kept him here while seizure activity persists.  RECOMMENDATION:  1.  Suggest renewal of IVC.  Dr. Donette Larry called, Psych CSW notified. 2.  Will follow pt.  Mickeal Skinner MD 04/06/2012 1:21 PM

## 2012-04-06 NOTE — Progress Notes (Addendum)
Pt call light went on at 0152 but pt did not respond. I walked into the room and noticed pt was having a seizure. Lasted ~2 minutes. Pt had another seizure that last ~4 minutes and then one more that lasted ~ 1 minute. Lenny Pastel was notified and order was received to give Ativan IM. Pt is now resting and responds to verbal commands. Neurology has been paged and I am waiting to hear from them. During this time pt BP was stable along with O2 Sats and heart rate normal. Vital Signs recorded. Will continue to follow up and monitor pt for any more signs of sz activity.   In between seizures pt responded to verbal commands by squeezing fingers.

## 2012-04-06 NOTE — Progress Notes (Signed)
Utilization review completed.  

## 2012-04-06 NOTE — Progress Notes (Signed)
[facility] Progress Note [place] TEAM 1 - Stepdown/ICU TEAM   [name] [name] ZOX:[id number] DOB: 1981/02/23 DOA: 03/26/2012 PCP: Sheila Oats, MD  Brief narrative: 31 year old man admitted for refractory epilepsy from [facility] where he was treated for depression, SI. Hospitalization prolonged secondary to recurrent seizures despite 4 antiepileptics. IVC renewed 11/25 per Psychiatry for depression, SI.  Although stable after prolonged seizure 11/25 PM, access remained a problem, seizure severity seemed to be worsening overall, and patient continued to intermittently and unpredictably refuse care. Patient had not gone >24 hours without a seizure since admission up to 11/26. He remained under IVC. Until seizures are controlled it was thought prudent to transfer to [facility] where Neurology is based. .  11/20 4 recorded seizures  11/21 3 recorded seizures  11/22 1 recorded seizure  11/23 5 recorded seizures  11/24 2+ recorded seizures  11/25 seizure complex ~40 minutes requiring 8 mg IM Ativan  11/29 - 0630am - seizure 11/30 - 07:00am seizure x2 12/02 - 02:00 seizure  Assessment/Plan:  Seizure disorder / refractory epilepsy As per Neurology Service - will need 72hrs seizure free prior to clearing for d/c home   Major depressive disorder/SI per Psychiatry who continues to follow the patient - unfortunately, it appears that the pt does not have involved family as he previously reported - as a result, Psych is suggesting that pt remain under IVC and that we pursue inpt psych hospitalization once cleared medically - sertraline dosing at the suggestion of Pscyh  DVT RUE Xarelto - patient has refused lab draws, therefore not a warfarin candidate  Difficult IV access unable to establish peripheral IV despite multiple attempts 11/24 - per Dr. 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 Could not access a collateral vein in right neck. Attempt made to access left  brachial vein above elbow. [Given DVT RUE] may have to use femoral vein in future for access." - at present there is no absolute requirement for an IV (seiz has been arrested previously w/ IM ativan)  Extensive dental caries per Dr. Kristin Bruins, patient needs outpatient oral surgeon - Dr. Florene Route as outpatient for possible dental extraction for dental caries after transfer to behavioral health - appointment with Dr. Florene Route can be arranged at [phone].  Alcohol abuse  Well beyond the window for withdrawal   Protein C deficiency  On chronic anticoag  Report of drug seeking behavior patient has reported various outpatient regimens - cont current tx plan w/o change  Code Status: FULL Disposition Plan: remain on SDU due to active seizure d/o w/ risk of status epilepticus Consultants: Neuro Psych  Procedures: none  Antibiotics: none  DVT prophylaxis: xarelto  HPI/Subjective: Resting comfortably at present.   Objective: Blood pressure 110/67, pulse 63, temperature 97.7 F (36.5 C), temperature source Oral, resp. rate 15, height 5\' 8"  (1.727 m), weight 72.1 kg (158 lb 15.2 oz), SpO2 96.00%.  Intake/Output Summary (Last 24 hours) at 04/06/12 1322 Last data filed at 04/06/12 1200  Gross per 24 hour  Intake    480 ml  Output   2025 ml  Net  -1545 ml     Exam: No signif acute findings on exam today  Data Reviewed: Basic Metabolic Panel:  Lab 03/30/12 8295  NA 138  K 4.1  CL 105  CO2 21  GLUCOSE 143*  BUN 17  CREATININE 0.90  CALCIUM 9.3  MG 2.1  PHOS --   Liver Function Tests:  Lab 03/30/12 1939  AST 29  ALT  19  ALKPHOS 140*  BILITOT 0.2*  PROT 7.6  ALBUMIN 3.7   CBC:  Lab 03/30/12 1939  WBC 4.0  NEUTROABS --  HGB 12.2*  HCT 39.3  MCV 78.9  PLT 327    Recent Results (from the past 240 hour(s))  MRSA PCR SCREENING     Status: Normal   Collection Time   03/31/12  3:47 PM      Component Value Range Status Comment   MRSA by PCR  NEGATIVE  NEGATIVE Final      Studies:  Recent x-ray studies have been reviewed in detail by the Attending Physician  Scheduled Meds:  Reviewed in detail by the Attending Physician   Lonia Blood, MD [facility] Office  [phone] Pager [phone]  On-Call/Text Page:      [email or web address]      password [email or web address]  If 7PM-7AM, please contact night-coverage [email or web address] Password [email or web address] 04/06/2012, 1:22 PM   LOS: 11 days

## 2012-04-06 NOTE — Progress Notes (Signed)
NEURO HOSPITALIST PROGRESS NOTE   SUBJECTIVE:                                                                                                                        Patient states he continues to have a HA and feel muscle aches. He denies feeling mentally slow or having subjective problems with cognition.   OBJECTIVE:                                                                                                                           Vital signs in last 24 hours: Temp:  [97.6 F (36.4 C)-98.4 F (36.9 C)] 97.6 F (36.4 C) (12/02 0749) Pulse Rate:  [58-121] 58  (12/02 0736) Resp:  [14-30] 14  (12/02 0736) BP: (106-135)/(63-75) 106/63 mmHg (12/02 0736) SpO2:  [95 %-100 %] 99 % (12/02 0736) Weight:  [72.1 kg (158 lb 15.2 oz)] 72.1 kg (158 lb 15.2 oz) (12/02 0455)  Intake/Output from previous day: 12/01 0701 - 12/02 0700 In: 600 [P.O.:600] Out: 2775 [Urine:2775] Intake/Output this shift:   Nutritional status: General  Past Medical History  Diagnosis Date  . Seizure   . Protein C deficiency   . Protein C deficiency     Neurologic ROS negative with exception of above. Musculoskeletal ROS muscle sorness  Neurologic Exam:  Mental Status: Alert, oriented, thought content appropriate.  Speech fluent without evidence of aphasia.  Able to follow 3 step commands without difficulty. Cranial Nerves: ZO:XWRUEA fields grossly normal, pupils equal, round, reactive to light and accommodation III,IV, VI: ptosis not present, extra-ocular motions intact bilaterally V,VII: smile symmetric, facial light touch sensation normal bilaterally VIII: hearing normal bilaterally IX,X: gag reflex present XI: bilateral shoulder shrug XII: midline tongue extension Motor: Right : Upper extremity   5/5    Left:     Upper extremity   5/5  Lower extremity   5/5     Lower extremity   5/5 Tone and bulk:normal tone throughout; no atrophy noted Sensory: Pinprick and  light touch intact throughout, bilaterally Deep Tendon Reflexes: 2+ and symmetric throughout Plantars: Right: downgoing   Left: downgoing Cerebellar: normal finger-to-nose,  normal heel-to-shin test CV: pulses palpable throughout     Lab Results: No results  found for this basename: cbc, bmp, coags, chol, tri, ldl, hga1c   Lipid Panel No results found for this basename: CHOL,TRIG,HDL,CHOLHDL,VLDL,LDLCALC in the last 72 hours  Studies/Results: No results found.  MEDICATIONS                                                                                                                        Scheduled:   . docusate sodium  100 mg Oral BID  . folic acid  1 mg Oral Daily  . [COMPLETED]  HYDROmorphone (DILAUDID) injection  1 mg Intramuscular Once  . lacosamide  100 mg Oral BID  . lacosamide  200 mg Oral BID  . levETIRAcetam  2,000 mg Oral Q12H  . [COMPLETED] LORazepam  2 mg Intravenous Once  . LORazepam  0.75 mg Oral Q8H  . phenytoin  200 mg Oral BID  . Rivaroxaban  15 mg Oral BID  . sertraline  50 mg Oral Daily  . thiamine  100 mg Oral Daily  . topiramate  75 mg Oral BID  . [DISCONTINUED] LORazepam  1 mg Oral Q8H    ASSESSMENT/PLAN:                                                                                                               Patient Active Hospital Problem List: Seizure disorder (03/07/2012)   Assessment: Continues to have break through seizures.  Currently on Topamax 75 BID (increase to 100 mg BID in two days). Also on Keppra 2 gm every 12 hours, Vimpat 300mg  BID, Dilantin 200mg  BID and 1mg  ativan PO q 8 hours.   Plan:  1) will Continue current AED regime 2) Will decrease Ativan to 0.75mg  every 8 hours    Felicie Morn PA-C [facility] [phone]  04/06/2012, 10:51 AM    I have reviewed this note.   Ritta Slot, MD [facility] [phone]  If 7pm- 7am, please page neurology on call at [phone].
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
## 2012-04-06 NOTE — Progress Notes (Signed)
Clinical Social Work  Psych MD requests IVC paperwork be renewed on patient. Dr. Sharon Seller signed paperwork. CSW faxed paperwork to [employer]. CSW called office which verified paperwork was received.  [place], [place] [phone]

## 2012-04-07 DIAGNOSIS — F1920 Other psychoactive substance dependence, uncomplicated: Secondary | ICD-10-CM

## 2012-04-07 DIAGNOSIS — F430 Acute stress reaction: Secondary | ICD-10-CM

## 2012-04-07 MED ORDER — TOPIRAMATE 100 MG PO TABS
100.0000 mg | ORAL_TABLET | Freq: Two times a day (BID) | ORAL | Status: DC
  Administered 2012-04-07 – 2012-04-20 (×26): 100 mg via ORAL
  Filled 2012-04-07 (×30): qty 1

## 2012-04-07 NOTE — Progress Notes (Signed)
Clinical Social Work Progress Note PSYCHIATRY SERVICE LINE 04/07/2012  Patient:  [name] [name]  Account:  [id number]  Admit Date:  03/26/2012  Clinical Social Worker:  Unk Lightning, LCSW  Date/Time:  04/07/2012 04:00 PM  Review of Patient  Overall Medical Condition:   Patient reports he still feels "like crap" since last seizure. RN reports that patient is moving to neuro floor when bed available.   Participation Level:  Active  Participation Quality  Appropriate   Other Participation Quality:   Affect  Flat   Cognitive  Appropriate   Reaction to Medications/Concerns:   None reported   Modes of Intervention  Support   Summary of Progress/Plan at Discharge   CSW spoke with bedside RN regarding patient. RN reports that patient has been appropriate and transferring off floor today.    CSW met with patient at bedside. Patient in bed watching tv. Patient reports that he feels bad and wishes he no longer had seizures. Patient denies any SI or HI. Patient reports he has been talking to stepfather every night. Patient has requested that family not come to visit. Patient reports it would be too hard to have them visit and then would be lonely when they left. Patient also feels he would be a burden to family if they had to drive to [place]. Patient reports that mother passed away in Sep 15, 1999. Patient never had a relationship with biological father but feels stepfather is very supportive. Patient spoke about tattoos he had for his mother and for his dtr. Patient reports that he is starting to feel too sad to talk about family that has passed away and asked that he not have to talk about it at this time. CSW agreeable and spoke about patient's hospital stay. Patient reports RNs have been nice and he is ready to get better to go home. Patient is agreeable to CSW to continue to follow.

## 2012-04-07 NOTE — Progress Notes (Signed)
[facility] Progress Note [place] TEAM 1 - Stepdown/ICU TEAM   [name] [name] ZOX:[id number] DOB: August 27, 1980 DOA: 03/26/2012 PCP: Sheila Oats, MD  Brief narrative: 31 year old man admitted for refractory epilepsy from [facility] where he was treated for depression, SI. Hospitalization prolonged secondary to recurrent seizures despite 4 antiepileptics. IVC renewed 11/25 per Psychiatry for depression, SI.  Although stable after prolonged seizure 11/25 PM, access remained a problem, seizure severity seemed to be worsening overall, and patient continued to intermittently and unpredictably refuse care. Patient had not gone >24 hours without a seizure since admission up to 11/26. He remained under IVC. Until seizures are controlled it was thought prudent to transfer to [facility] where Neurology is based. .  11/20 4 recorded seizures  11/21 3 recorded seizures  11/22 1 recorded seizure  11/23 5 recorded seizures  11/24 2+ recorded seizures  11/25 seizure complex ~40 minutes requiring 8 mg IM Ativan  11/29 - 0630am - seizure 11/30 - 07:00am seizure x2 12/02 - 02:00 seizure  Assessment/Plan:  Seizure disorder / refractory epilepsy As per Neurology Service - will need 72hrs seizure free prior to clearing for d/c home - some concern that he is having pseudoseizures- over the past few days pt has been reporting seizures (apparently has a strong aura) but no way to actually visualize them in this room- will transfer to camera room today- d/w Onalee Hua- Neuro PA  Major depressive disorder/SI per Psychiatry who continues to follow the patient - unfortunately, it appears that the pt does not have involved family as he previously reported - as a result, Psych is suggesting that pt remain under IVC and that we pursue inpt psych hospitalization once cleared medically - sertraline dosing at the suggestion of Pscyh  DVT RUE Xarelto - patient has refused lab draws, therefore not a warfarin  candidate  Difficult IV access unable to establish peripheral IV despite multiple attempts 11/24 - per Dr. 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 Could not access a collateral vein in right neck. Attempt made to access left brachial vein above elbow. [Given DVT RUE] may have to use femoral vein in future for access." - at present there is no absolute requirement for an IV (seiz has been arrested previously w/ IM ativan)  Extensive dental caries per Dr. Kristin Bruins, patient needs outpatient oral surgeon - Dr. Florene Route as outpatient for possible dental extraction for dental caries after transfer to behavioral health - appointment with Dr. Florene Route can be arranged at [phone].  Alcohol abuse  Well beyond the window for withdrawal   Protein C deficiency  On chronic anticoag  Report of drug seeking behavior patient has reported various outpatient regimens - cont current tx plan w/o change  Code Status: FULL Disposition Plan: transfer to neuro- camera room today Consultants: Neuro Psych  Procedures: none  Antibiotics: none  DVT prophylaxis: xarelto  HPI/Subjective: Laying in bed- c/o on and off headaches, does not like lights on due to exacerbation of headaches- no other complaints.    Objective: Blood pressure 111/72, pulse 63, temperature 97.2 F (36.2 C), temperature source Oral, resp. rate 17, height 5\' 8"  (1.727 m), weight 71.4 kg (157 lb 6.5 oz), SpO2 96.00%.  Intake/Output Summary (Last 24 hours) at 04/07/12 1839 Last data filed at 04/07/12 1500  Gross per 24 hour  Intake    720 ml  Output   1500 ml  Net   -780 ml     Exam: Gen- appears depressed- oriented, no acute distress  HEENT- eyes bloodshot CVS- RRR, no murmurs GI- soft, NT, ND, BS+ Resp-  CTA b/l Ext- no c/c/e  Data Reviewed: Basic Metabolic Panel: No results found for this basename: NA:5,K:5,CL:5,CO2:5,GLUCOSE:5,BUN:5,CREATININE:5,CALCIUM:5,MG:5,PHOS:5 in the last  168 hours Liver Function Tests: No results found for this basename: AST:5,ALT:5,ALKPHOS:5,BILITOT:5,PROT:5,ALBUMIN:5 in the last 168 hours CBC: No results found for this basename: WBC:5,NEUTROABS:5,HGB:5,HCT:5,MCV:5,PLT:5 in the last 168 hours  Recent Results (from the past 240 hour(s))  MRSA PCR SCREENING     Status: Normal   Collection Time   03/31/12  3:47 PM      Component Value Range Status Comment   MRSA by PCR NEGATIVE  NEGATIVE Final      Studies:  Recent x-ray studies have been reviewed in detail by the Attending Physician  Scheduled Meds:  Reviewed in detail by the Attending Physician   Calvert Cantor, MD [phone]  If 7PM-7AM, please contact night-coverage [email or web address] Password [facility] 04/07/2012, 6:39 PM   LOS: 12 days

## 2012-04-07 NOTE — Progress Notes (Signed)
Subjective: No seizures overnight.   Exam: Filed Vitals:   04/07/12 1200  BP:   Pulse:   Temp: 97.5 F (36.4 C)  Resp:    Gen: In bed, NAD MS: Awake, Alert, oriented ZO:XWRUE, EOMI Motor: 5/5 throughout Sensory:intact to LT  Impression: 31 yo M with refractory epilepsy. We could go ahead and increase topiramate today as the patient has not had any cognitive side effects. He has had severe(per patient) hyperammonemia with depakote previously and there I do not feel that this is an option. Last seizure 12/2, had 4 minute one, then recurrance lasting one minute.   Recommendations: 1) Increase topiramate to 100mg  BID. Will check labs tomorrow. 2) Continue phenytoin 200mg  BID  3) Continue keppra 2gm BID 4) continue lacosamide 300 BID 5) would start weaning ativan tomorrow.   Ritta Slot, MD [facility] [phone]  If 7pm- 7am, please page neurology on call at [phone].

## 2012-04-08 MED ORDER — MORPHINE SULFATE 15 MG PO TABS
30.0000 mg | ORAL_TABLET | Freq: Three times a day (TID) | ORAL | Status: DC | PRN
  Administered 2012-04-08 – 2012-04-20 (×34): 30 mg via ORAL
  Filled 2012-04-08 (×14): qty 2
  Filled 2012-04-08: qty 3
  Filled 2012-04-08 (×2): qty 2
  Filled 2012-04-08: qty 1
  Filled 2012-04-08 (×2): qty 2
  Filled 2012-04-08: qty 1
  Filled 2012-04-08 (×7): qty 2
  Filled 2012-04-08: qty 1
  Filled 2012-04-08 (×5): qty 2
  Filled 2012-04-08: qty 1
  Filled 2012-04-08: qty 2

## 2012-04-08 MED ORDER — RIVAROXABAN 20 MG PO TABS
20.0000 mg | ORAL_TABLET | Freq: Every day | ORAL | Status: DC
  Administered 2012-04-08 – 2012-04-20 (×13): 20 mg via ORAL
  Filled 2012-04-08 (×13): qty 1

## 2012-04-08 NOTE — Progress Notes (Signed)
[facility] Progress Note [place] TEAM 4    [name] [name] ZOX:[id number] DOB: [birth year] DOA: 03/26/2012 PCP: Sheila Oats, MD  Brief narrative: 31 year old man admitted for refractory epilepsy from [facility] where he was treated for depression, SI. Hospitalization prolonged secondary to recurrent seizures despite 4 antiepileptics. IVC renewed 11/25 per Psychiatry for depression, SI.  Although stable after prolonged seizure 11/25 PM, access remained a problem, seizure severity seemed to be worsening overall, and patient continued to intermittently and unpredictably refuse care. Patient had not gone >24 hours without a seizure since admission up to 11/26. He remained under IVC. Until seizures are controlled it was thought prudent to transfer to [facility] where Neurology is based. .  11/20 4 recorded seizures  11/21 3 recorded seizures  11/22 1 recorded seizure  11/23 5 recorded seizures  11/24 2+ recorded seizures  11/25 seizure complex ~40 minutes requiring 8 mg IM Ativan  11/29 - 0630am - seizure 11/30 - 07:00am seizure x2 12/02 - 02:00 seizure  Assessment/Plan:  Seizure disorder / refractory epilepsy As per Neurology Service - will need 72hrs seizure free prior to clearing for d/c home - some concern that he is having pseudoseizures- over the past few days pt has been reporting seizures (apparently has a strong aura) but no way to actually visualize them in this room- Patient to be transfered to camera room.  Patient with no seizure activity over past 24 hours. Continue current medications with vimpat, keppra, dilantin, topamax. Neurology titrating medications. Per Neuro.  Major depressive disorder/SI Patient denies any suicidal ideations. per Psychiatry who continues to follow the patient - unfortunately, it appears that the pt does not have involved family as he previously reported - as a result, Psych is suggesting that pt remain under IVC and that we pursue inpt psych  hospitalization once cleared medically - sertraline dosing at the suggestion of Pscyh  DVT RUE Xarelto - patient has refused lab draws, therefore not a warfarin candidate  Difficult IV access unable to establish peripheral IV despite multiple attempts 11/24 - per Dr. 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 Could not access a collateral vein in right neck. Attempt made to access left brachial vein above elbow. [Given DVT RUE] may have to use femoral vein in future for access." - at present there is no absolute requirement for an IV (seiz has been arrested previously w/ IM ativan)  Extensive dental caries per Dr. Kristin Bruins, patient needs outpatient oral surgeon - Dr. Florene Route as outpatient for possible dental extraction for dental caries after transfer to behavioral health - appointment with Dr. Florene Route can be arranged at [phone].  Alcohol abuse  Well beyond the window for withdrawal. Stable.  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 Patient to follow up with MD managing his pain medications as outpatient on discharge.  Code Status: FULL Disposition Plan: transfer to neuro- camera room today Consultants: Neuro Psych  Procedures: none  Antibiotics: none  DVT prophylaxis: xarelto  HPI/Subjective: Laying in bed-. Patient and nursing stating no seizures over past 24 hours. Headaches improving with no seizures.    Objective: Blood pressure 111/70, pulse 58, temperature 98 F (36.7 C), temperature source Oral, resp. rate 13, height 5\' 8"  (1.727 m), weight 69.7 kg (153 lb 10.6 oz), SpO2 97.00%.  Intake/Output Summary (Last 24 hours) at 04/08/12 8295 Last data filed  at 04/08/12 0741  Gross per 24 hour  Intake    780 ml  Output   2425 ml  Net  -1645 ml     Exam: Gen- appears depressed-  oriented, no acute distress HEENT- eyes bloodshot CVS- RRR, no murmurs GI- soft, NT, ND, BS+ Resp-  CTA b/l Ext- no c/c/e  Data Reviewed: Basic Metabolic Panel: No results found for this basename: NA:5,K:5,CL:5,CO2:5,GLUCOSE:5,BUN:5,CREATININE:5,CALCIUM:5,MG:5,PHOS:5 in the last 168 hours Liver Function Tests: No results found for this basename: AST:5,ALT:5,ALKPHOS:5,BILITOT:5,PROT:5,ALBUMIN:5 in the last 168 hours CBC: No results found for this basename: WBC:5,NEUTROABS:5,HGB:5,HCT:5,MCV:5,PLT:5 in the last 168 hours  Recent Results (from the past 240 hour(s))  MRSA PCR SCREENING     Status: Normal   Collection Time   03/31/12  3:47 PM      Component Value Range Status Comment   MRSA by PCR NEGATIVE  NEGATIVE Final      Studies:  Recent x-ray studies have been reviewed in detail by the Attending Physician  Scheduled Meds:  Reviewed in detail by the Attending Physician   Ramiro Harvest, MD [phone]  If 7PM-7AM, please contact night-coverage [email or web address] Password [email or web address] 04/08/2012, 8:11 AM   LOS: 13 days

## 2012-04-08 NOTE — Progress Notes (Signed)
Subjective: Last documented seizure was 12/2.  Patient reports that this morning had an aura when he had a strong metallic taste in his mouth and then went out for a few seconds.  Reports that he was able to ward off the seizure.  Patient remains on Topamax, Dilantin, Keppra and Vimpat.  Ativan is being used as well.  Topamax last increased on yesterday.  Patient does not report side effects.  Objective: Current vital signs: BP 111/70  Pulse 58  Temp 98 F (36.7 C) (Oral)  Resp 13  Ht 5\' 8"  (1.727 m)  Wt 69.7 kg (153 lb 10.6 oz)  BMI 23.36 kg/m2  SpO2 97% Vital signs in last 24 hours: Temp:  [97.2 F (36.2 C)-98 F (36.7 C)] 98 F (36.7 C) (12/04 0740) Pulse Rate:  [52-66] 58  (12/04 0400) Resp:  [13-15] 13  (12/04 0740) BP: (105-120)/(63-72) 111/70 mmHg (12/04 0740) SpO2:  [97 %-100 %] 97 % (12/04 0400) Weight:  [69.7 kg (153 lb 10.6 oz)] 69.7 kg (153 lb 10.6 oz) (12/04 0400)  Intake/Output from previous day: 12/03 0701 - 12/04 0700 In: 1140 [P.O.:1140] Out: 1950 [Urine:1950] Intake/Output this shift: Total I/O In: -  Out: 475 [Urine:475] Nutritional status: General  Neurologic Exam: Mental Status: Alert, oriented, thought content appropriate.  Speech fluent without evidence of aphasia.  Able to follow 3 step commands without difficulty. Cranial Nerves: II: Discs flat bilaterally; Visual fields grossly normal, pupils equal, round, reactive to light and accommodation III,IV, VI: ptosis not present, extra-ocular motions intact bilaterally V,VII: smile symmetric, facial light touch sensation normal bilaterally VIII: hearing normal bilaterally IX,X: gag reflex present XI: bilateral shoulder shrug XII: midline tongue extension Motor: Right : Upper extremity   5/5    Left:     Upper extremity   5/5  Lower extremity   5/5     Lower extremity   5/5 Tone and bulk:normal tone throughout; no atrophy noted Sensory: Pinprick and light touch intact throughout, bilaterally Deep  Tendon Reflexes: 2+ and symmetric throughout Plantars: Right: downgoing   Left: downgoing Cerebellar: normal finger-to-nose, normal rapid alternating movements and normal heel-to-shin test Gait: normal gait and station CV: pulses palpable throughout     Lab Results: Basic Metabolic Panel: No results found for this basename: NA:5,K:5,CL:5,CO2:5,GLUCOSE:5,BUN:5,CREATININE:5,CALCIUM:3,MG:5,PHOS:5 in the last 168 hours  Liver Function Tests: No results found for this basename: AST:5,ALT:5,ALKPHOS:5,BILITOT:5,PROT:5,ALBUMIN:5 in the last 168 hours No results found for this basename: LIPASE:5,AMYLASE:5 in the last 168 hours No results found for this basename: AMMONIA:3 in the last 168 hours  CBC: No results found for this basename: WBC:5,NEUTROABS:5,HGB:5,HCT:5,MCV:5,PLT:5 in the last 168 hours  Cardiac Enzymes: No results found for this basename: CKTOTAL:5,CKMB:5,CKMBINDEX:5,TROPONINI:5 in the last 168 hours  Lipid Panel: No results found for this basename: CHOL:5,TRIG:5,HDL:5,CHOLHDL:5,VLDL:5,LDLCALC:5 in the last 168 hours  CBG: No results found for this basename: GLUCAP:5 in the last 168 hours  Microbiology: Results for orders placed during the hospital encounter of 03/26/12  MRSA PCR SCREENING     Status: Normal   Collection Time   03/31/12  3:47 PM      Component Value Range Status Comment   MRSA by PCR NEGATIVE  NEGATIVE Final     Coagulation Studies: No results found for this basename: LABPROT:5,INR:5 in the last 72 hours  Imaging: No results found.  Medications:  I have reviewed the patient's current medications. Scheduled:   . docusate sodium  100 mg Oral BID  . folic acid  1 mg Oral Daily  .  lacosamide  100 mg Oral BID  . lacosamide  200 mg Oral BID  . levETIRAcetam  2,000 mg Oral Q12H  . LORazepam  0.75 mg Oral Q8H  . phenytoin  200 mg Oral BID  . Rivaroxaban  20 mg Oral Q supper  . sertraline  50 mg Oral Daily  . thiamine  100 mg Oral Daily  .  topiramate  100 mg Oral BID  . [DISCONTINUED] Rivaroxaban  15 mg Oral BID  . [DISCONTINUED] topiramate  75 mg Oral BID    Assessment/Plan:  Patient Active Hospital Problem List: Seizure disorder (03/07/2012)   Assessment: Seizure frequency improving on multiple AED's and Ativan. Patient reports that his stepfather is coming tomorrow.  If patient remains reasonably stable may be able to be discharged into his care at that time.  Patient reports he will not be returning home alone.   Plan:  1.  Continue AED's at current doses.      LOS: 13 days   Thana Farr, MD [facility] [phone] 04/08/2012  9:21 AM

## 2012-04-08 NOTE — Progress Notes (Signed)
Clinical Social Work  CSW attempted to meet with patient. Patient was in bed sleeping and reports that he does not want to talk at this time. Patient asked CSW to follow up at a later time.  [place], [place] [phone]

## 2012-04-08 NOTE — Progress Notes (Signed)
Patient Identification:  [name] [name] Date of Evaluation:  04/08/2012 Reason for Consult:  Seizure Disorder  Referring Provider: Dr. Janee Morn Dr. Thad Ranger Pt was transferred from [facility] after initial seizure to [facility] He was followed by Neurology and was finally transferred to [facility] to have continue follow by neurology and medicine. Pt continues to have seizures as anticonvulsant medication is slowing titrated.   History of Present Illness:Pt has multiple seizures despite 4 anticonvulsant medications.  He says he has a very 'rough' time after a seizure to return to feeling 'normal'.  He often gets a severe headache after a seizures.  His treatment is complicated because he has refused to allow contact with his treating physicians in [place].  He says he also needs pain medication  Past Psychiatric History:He has a history of pain medication/medication seeking.  He has refused diagnostic studies, does not agree to certain treatment routines.   Past Medical History:     Past Medical History  Diagnosis Date  . Seizure   . Protein C deficiency   . Protein C deficiency        Past Surgical History  Procedure Date  . Insertion of vena cava filter     Allergies:  Allergies  Allergen Reactions  . Fish Allergy Anaphylaxis and Rash  . Depakote (Divalproex Sodium) Swelling  . Morphine And Related Hives    Pt tolerated IV morphine 03/25/2012 admission. Pt tolerated PO morphine 03/29/2012.    Current Medications:  Prior to Admission medications   Medication Sig Start Date End Date Taking? Authorizing Provider  lacosamide 100 MG TABS Take 1 tablet (100 mg total) by mouth 2 (two) times daily. 03/16/12  Yes Meredeth Ide, MD  levETIRAcetam (KEPPRA) 750 MG tablet Take 2 tablets (1,500 mg total) by mouth 2 (two) times daily. 03/16/12  Yes Meredeth Ide, MD  morphine (MSIR) 30 MG tablet Take 30 mg by mouth every 6 (six) hours as needed. Pain.  Patient reports taking prior to admission, with Rx from  [facility] in [place]. Unable to verify. Last pain med at [facility] [phone]) was Oxycodone in 2011;  Patient states he has a supply at home, and will NOT need a prescription for this at discharge.   Yes Historical Provider, MD  phenytoin (DILANTIN) 100 MG ER capsule Take 2 capsules (200 mg total) by mouth 2 (two) times daily. 03/16/12  Yes Meredeth Ide, MD  Rivaroxaban (XARELTO) 20 MG TABS Take 20 mg by mouth daily with supper. Took 15 mg BID for 3 weeks (begun 11/19). To continue on 20 mg daily with supper.   Yes Historical Provider, MD    Social History:    reports that he has been smoking Cigarettes.  He has a 1.25 pack-year smoking history. He has never used smokeless tobacco. He reports that he drinks about 3.6 ounces of alcohol per week. He reports that he uses illicit drugs (Marijuana).   Family History:    History reviewed. No pertinent family history.  Mental Status Examination/Evaluation: Objective:  Appearance: Casual and appears better today than yesterday  Eye Contact::  Good  Speech:  Clear and Coherent  Volume:  Normal  Mood:  More calm and says he feels better  Affect:  Appropriate and Congruent  Thought Process:  Coherent, Goal Directed and Logical  Orientation:  Full (Time, Place, and Person)  Thought Content:  wants to live alone but considers he may lieve with step father for a few weeks  Suicidal Thoughts:  No  Homicidal Thoughts:  No  Judgement:  Fair  Insight:  Fair   DIAGNOSIS:   AXIS I   Pain medication - seeking. R/O psudoseizures  AXIS II  Deferred  AXIS III See medical notes.  AXIS IV economic problems, other psychosocial or environmental problems, problems related to social environment and non-adherence to treatment regimen  AXIS V 51-60 moderate symptoms   Assessment/Plan:  Discussed with Dr. Thad Ranger, to call Dr. Janee Morn.  Pt appears more relaxed with less frowning today and greater fluency of speech.  He  says he will see step father tomorrow [mother has died].   He was raised by his step father.  He says he sees a doctor in [place].  When he has a series of seizures, it takes a LONG time to stabilize seizures once the medication dose level is corrected.   He may agree to live briefly with stop father but wants to live alone.   RECOMMENDATION:  1.  Pt hs capacity to understand his condition but does not always choose to cooperate with diagnostics or share treating MD information, medication he is given 2.  Consider pseudoseizures especially if pt is so guarded about his treating physician, types of studies done and medications taken. Per Dr. Thad Ranger 3.  Continue medications 4.  Will follow pt. Mickeal Skinner MD 04/08/2012 3:36 PM

## 2012-04-08 NOTE — Progress Notes (Signed)
Patient Identification:  [name] [name] Date of Evaluation:  04/08/2012 Reason for Consult: Follow up Consultation Congenital Seizures   Referring Provider: Dr.Mc Clung   History of Present Illness:Pt was transferred from [facility] after initial seizure to [facility] He was followed by Neurology and was finally transferred to [facility] to have continue follow by neurology and medicine. Pt continues to have seizures as anticonvulsant medication is slowing titrated.   Past Psychiatric History: History of non-adherence to medication / treatment regimen, history of medication seeking. For pain meds.   Past Medical History:     Past Medical History  Diagnosis Date  . Seizure   . Protein C deficiency   . Protein C deficiency        Past Surgical History  Procedure Date  . Insertion of vena cava filter     Allergies:  Allergies  Allergen Reactions  . Fish Allergy Anaphylaxis and Rash  . Depakote (Divalproex Sodium) Swelling  . Morphine And Related Hives    Pt tolerated IV morphine 03/25/2012 admission. Pt tolerated PO morphine 03/29/2012.    Current Medications:  Prior to Admission medications   Medication Sig Start Date End Date Taking? Authorizing Provider  lacosamide 100 MG TABS Take 1 tablet (100 mg total) by mouth 2 (two) times daily. 03/16/12  Yes Meredeth Ide, MD  levETIRAcetam (KEPPRA) 750 MG tablet Take 2 tablets (1,500 mg total) by mouth 2 (two) times daily. 03/16/12  Yes Meredeth Ide, MD  phenytoin (DILANTIN) 100 MG ER capsule Take 2 capsules (200 mg total) by mouth 2 (two) times daily. 03/16/12  Yes Meredeth Ide, MD  rivaroxaban (XARELTO) 10 MG TABS tablet Take 20 mg by mouth 2 (two) times daily.   Yes Historical Provider, MD  traMADol (ULTRAM) 50 MG tablet Take 1 tablet (50 mg total) by mouth every 6 (six) hours as needed. 03/16/12  Yes Meredeth Ide, MD    Social History:    reports that he has been smoking Cigarettes.  He has a 1.25 pack-year smoking history. He has never used  smokeless tobacco. He reports that he drinks about 3.6 ounces of alcohol per week. He reports that he uses illicit drugs (Marijuana).   Family History:    History reviewed. No pertinent family history.  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  AXIS IV other psychosocial or environmental problems, problems related to social environment and problesm adhering to meication regimen    AXIS V 41-50 serious symptoms   Assessment/Plan: Pt appears to be extremely uncofortable.  He says he always has terrible headaches after seizures.  He denies SI.  RECOMMENDATION:  1. Will follow pt. who is cognitively intact.  [name] [name] 04/08/2012 1:31 AM

## 2012-04-09 ENCOUNTER — Encounter (HOSPITAL_COMMUNITY): Payer: Self-pay | Admitting: *Deleted

## 2012-04-09 MED ORDER — LACOSAMIDE 50 MG PO TABS
200.0000 mg | ORAL_TABLET | Freq: Two times a day (BID) | ORAL | Status: DC
  Administered 2012-04-09 – 2012-04-13 (×9): 200 mg via ORAL
  Filled 2012-04-09 (×9): qty 4

## 2012-04-09 MED ORDER — PHENOBARBITAL 64.8 MG PO TABS
64.8000 mg | ORAL_TABLET | Freq: Every day | ORAL | Status: DC
  Filled 2012-04-09: qty 1

## 2012-04-09 MED ORDER — MORPHINE SULFATE 30 MG PO TABS
30.0000 mg | ORAL_TABLET | Freq: Once | ORAL | Status: AC
  Administered 2012-04-09: 30 mg via ORAL

## 2012-04-09 MED ORDER — OXYCODONE HCL 5 MG PO TABS
5.0000 mg | ORAL_TABLET | Freq: Once | ORAL | Status: AC
  Administered 2012-04-09: 5 mg via ORAL
  Filled 2012-04-09: qty 1

## 2012-04-09 MED ORDER — PHENOBARBITAL 32.4 MG PO TABS
64.8000 mg | ORAL_TABLET | Freq: Every day | ORAL | Status: DC
  Administered 2012-04-09 – 2012-04-19 (×11): 64.8 mg via ORAL
  Filled 2012-04-09 (×6): qty 2
  Filled 2012-04-09: qty 1
  Filled 2012-04-09 (×5): qty 2

## 2012-04-09 NOTE — Progress Notes (Signed)
Pt to TX to 4N-16, VSS, called report.

## 2012-04-09 NOTE — Progress Notes (Signed)
Clinical Social Work  CSW attempted to meet with patient at bedside. Patient reported to have seizures early this morning. Patient reports he does not feel he is able to talk with CSW at this time due to having a headache from seizures. CSW will continue to follow.  [place], [place] [phone]

## 2012-04-09 NOTE — Progress Notes (Signed)
Event: RN notified that pt had 2 generalized seizures lasting approx 2 min in duration. Pt did have urinary incontinence and increased foam-like oral secretions c/w gran-mal seizure activity. Pt did not drop his 02 sats or have any indication of airway compromise. Pt was given 2mg  of IM Ativan and seizure activity has resolved. VSS. NP to bedside. Subjective: Pt reports he does not have any memory of having a seizure as he was sleeping. He does report that he has a h/a which he usually does after a seizure. Reports the h/a is currently 7/10 in severity and is located in the  frontal and (R) temporal area. Initially declined offer of medication for his h/a but then changed his mind. Objective: Pt is a 31 year old man admitted on 03/20/12 for refractory epilepsy from [facility] where he was being treated for depression, SI. Hospitalization prolonged secondary to recurrent seizures despite 4 antiepileptics. Pt was transferred to [facility] on 03/31/12 where Neurology service is based. At bedside pt noted somewhat lethargic but responsive and  appropriate. Speech is clear. BBS CTA, HR 72 w/ RRR and w/o M/G/R. Current VS T-98.2, BP 128/77, P-68-72, R-21 w/ 02 sats of 94% on R/A. Pupils 3/3 equal and reactive, EOMI,  there is no obvious facial droop, CN I-XII appear grossly intact, MAEx4 w/ 5/5 strength BUE and 5/5 BLE's.  Assessment/Plan: 1. Seizure: Generalized. Resolved. Last recorded seizure 04/06/12. Pt reporting residual h/a but no focal neurological deficits identified. Last lab work on 03/30/12 revealed low but therapeutic dilantin level.   No labs for now as pt has had access problems and had to be stuck numerous times. He currently refuses to have labs drawn. He does not have IV access. Patient remains on Topamax, Dilantin, Keppra and Vimpat. Will defer any changes in pt's current regimen to rounding MD in am. Continue PRN Ativan IM.  2. Headache: Likely related to #1. No focal deficits. One time dose of Oxy IR 5mg   given PO. Continue MSIR 30mg  q8h PRN. Will continue to monitor closely.  Leanne Chang, NP-C [facility] Pager [phone]

## 2012-04-09 NOTE — Progress Notes (Signed)
Patient arrived to unit via wheelchair with nurse tech.  Patient oriented to unit.  Patient alert and oriented x3.  Patient refuses to have the bed alarm set on the bed stating that he is a grown man and that he will call when he needs to get out of bed.  Explained the rationale for the bed alarm and the importance of calling for assistance out of bed due to the risk of falling due to seizure activity; patient verbalizes understanding.  No IV present.  Complained of a headache, but headache resolved when lights were turned off, no additional interventions needed.  Dry/flaky skin and some excoriation noted to left foot.  Will continue to monitor.

## 2012-04-09 NOTE — Progress Notes (Signed)
Progress Note s/p consultation:[facility] MD Progress Note  04/09/2012 10:19 PM  The patient was seen today and reports the following:  Diagnosis:  Axis I: Congenital Seizures, Drug seeking behavior  ADL's:  Intact  Sleep:  No   Problems sleeping after seizures  Appetite:  Yes,  AEB:  Mild>(1-10) >Severe  7  Hopelessness (1-10):5 Depression (1-10):8 Anxiety (1-10): 4  Suicidal Ideation:   Plan:  No  Intent:  No  Means:  NA  Homicidal Ideation:   Plan:  No  Intent:  No  Means:  No  Mental Status: General Appearance Luretha Murphy:  Guarded Eye Contact:  Good Motor Behavior:  Normal Speech:  Normal Level of Consciousness:  Drowsy Mood:  Depressed Affect:  Constricted Anxiety Level:  Minimal Thought Process:  Relevant Thought Content:  Rumination regarding nature of seizures and after effects Perception:  Normal Judgment:  Fair Insight:  Absent Cognition:  Orientation time, place and person Sleep:     Vital Signs:Blood pressure 108/66, pulse 66, temperature 98.3 F (36.8 C), temperature source Oral, resp. rate 18, height 5\' 8"  (1.727 m), weight 69.9 kg (154 lb 1.6 oz), SpO2 98.00%.  Lab Results: No results found for this or any previous visit (from the past 48 hour(s)).   Treatment Plan Summary: Daily contact with patient to assess and evaluate symptoms and progress in treatment Medication management The patient will deny suicidal ideations for 48 hours prior to discharge and have a depression and anxiety rating of 3 or less.  RECOMMENDATION:Page pending; to discuss with Dr. Janee Morn;  1. Suggest continued taper of ativan, may use buspirone - if compatible - 15 mg 3 times daily 2. Pt is c/o problems sleeping, will discuss with Dr. Thad Ranger. 3. Will follow pt.  Shaneca Orne 04/09/2012, 10:19 PM

## 2012-04-09 NOTE — Progress Notes (Signed)
Utilization review completed.  

## 2012-04-09 NOTE — Progress Notes (Signed)
[facility] Progress Note [place] TEAM 4    Guillaume Xxx-Bowens ZOX:[id number] DOB: June 03, 1980 DOA: 03/26/2012 PCP: Sheila Oats, MD  Brief narrative: 31 year old man admitted for refractory epilepsy from [facility] where he was treated for depression, SI. Hospitalization prolonged secondary to recurrent seizures despite 4 antiepileptics. IVC renewed 11/25 per Psychiatry for depression, SI.  Although stable after prolonged seizure 11/25 PM, access remained a problem, seizure severity seemed to be worsening overall, and patient continued to intermittently and unpredictably refuse care. Patient had not gone >24 hours without a seizure since admission up to 11/26. He remained under IVC. Until seizures are controlled it was thought prudent to transfer to [facility] where Neurology is based. .  11/20 4 recorded seizures  11/21 3 recorded seizures  11/22 1 recorded seizure  11/23 5 recorded seizures  11/24 2+ recorded seizures  11/25 seizure complex ~40 minutes requiring 8 mg IM Ativan  11/29 - 0630am - seizure 11/30 - 07:00am seizure x2 12/02 - 02:00 seizure 12/05  - 2 generalized seizures 2 mins each approx 220am  Assessment/Plan:  Seizure disorder / refractory epilepsy As per Neurology Service - will need 72hrs seizure free prior to clearing for d/c home - some concern that he is having pseudoseizures- over the past few days pt has been reporting seizures (apparently has a strong aura) but no way to actually visualize them in this room- Patient to be transfered to camera room.  Patient with 2 generalized seizure activity early this morning. Phenobarbital added regimen per neurology. Continue current medications with vimpat, keppra, dilantin, topamax. Neurology titrating medications. Per Neuro.  Major depressive disorder/SI Patient denies any suicidal ideations. per Psychiatry who continues to follow the patient - unfortunately, it appears that the pt states per psych report his step father is  going to visit today and may stay with him briefly after discharge.  Psych is suggesting that pt remain under IVC and that we pursue inpt psych hospitalization once cleared medically - sertraline dosing at the suggestion of Pscyh.  DVT RUE Xarelto - patient has refused lab draws, therefore not a warfarin candidate  Difficult IV access unable to establish peripheral IV despite multiple attempts 11/24 - per Dr. 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 Could not access a collateral vein in right neck. Attempt made to access left brachial vein above elbow. [Given DVT RUE] may have to use femoral vein in future for access." - at present there is no absolute requirement for an IV (seiz has been arrested previously w/ IM ativan)  Extensive dental caries per Dr. Kristin Bruins, patient needs outpatient oral surgeon - Dr. Florene Route as outpatient for possible dental extraction for dental caries after transfer to behavioral health - appointment with Dr. Florene Route can be arranged at [phone].  Alcohol abuse  Well beyond the window for withdrawal. Stable.  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 Patient to follow up with MD managing his pain medications as outpatient on discharge.  Code Status: FULL Disposition Plan: transfer to neuro- camera room today Consultants: Neuro Psych  Procedures: none  Antibiotics: none  DVT prophylaxis: xarelto  HPI/Subjective: Patient with 2 generalized seizures early this morning, none since. Patient sleeping but easily arousable. Patient states does not remember events.   Objective: Blood pressure 140/76, pulse 69, temperature 98.2 F (36.8 C), temperature source Oral, resp. rate 17, height  5\' 8"  (1.727 m), weight 69.9 kg (154 lb 1.6 oz), SpO2 97.00%.  Intake/Output Summary (Last 24  hours) at 04/09/12 0930 Last data filed at 04/08/12 1500  Gross per 24 hour  Intake      0 ml  Output   1025 ml  Net  -1025 ml     Exam: Gen- appears depressed- oriented, no acute distress HEENT- eyes bloodshot CVS- RRR, no murmurs GI- soft, NT, ND, BS+ Resp-  CTA b/l Ext- no c/c/e  Data Reviewed: Basic Metabolic Panel: No results found for this basename: NA:5,K:5,CL:5,CO2:5,GLUCOSE:5,BUN:5,CREATININE:5,CALCIUM:5,MG:5,PHOS:5 in the last 168 hours Liver Function Tests: No results found for this basename: AST:5,ALT:5,ALKPHOS:5,BILITOT:5,PROT:5,ALBUMIN:5 in the last 168 hours CBC: No results found for this basename: WBC:5,NEUTROABS:5,HGB:5,HCT:5,MCV:5,PLT:5 in the last 168 hours  Recent Results (from the past 240 hour(s))  MRSA PCR SCREENING     Status: Normal   Collection Time   03/31/12  3:47 PM      Component Value Range Status Comment   MRSA by PCR NEGATIVE  NEGATIVE Final      Studies:  Recent x-ray studies have been reviewed in detail by the Attending Physician  Scheduled Meds:  Reviewed in detail by the Attending Physician   Ramiro Harvest, MD [id number]  If 7PM-7AM, please contact night-coverage [email or web address] Password [email or web address] 04/09/2012, 9:30 AM   LOS: 14 days

## 2012-04-09 NOTE — Progress Notes (Signed)
Pt had 2 generalized seizures each lasting aprrox. 2 min a piece. Pt VSS at the time Lorazepam 2mg  IM given per prior MD order. Will notify MD for further orders.

## 2012-04-09 NOTE — Progress Notes (Signed)
Subjective: Patient with two GTC seizures early this morning requiring 2mg  of Ativan.  It seems the patient has been resting since that time.  No further seizure activity noted.   Objective: Current vital signs: BP 140/76  Pulse 69  Temp 98.2 F (36.8 C) (Oral)  Resp 17  Ht 5\' 8"  (1.727 m)  Wt 69.9 kg (154 lb 1.6 oz)  BMI 23.43 kg/m2  SpO2 97% Vital signs in last 24 hours: Temp:  [97.3 F (36.3 C)-98.9 F (37.2 C)] 98.2 F (36.8 C) (12/05 0400) Pulse Rate:  [57-73] 69  (12/05 0400) Resp:  [12-17] 17  (12/05 0400) BP: (102-140)/(63-76) 140/76 mmHg (12/05 0400) SpO2:  [94 %-98 %] 97 % (12/05 0400) Weight:  [69.9 kg (154 lb 1.6 oz)] 69.9 kg (154 lb 1.6 oz) (12/05 0400)  Intake/Output from previous day: 12/04 0701 - 12/05 0700 In: -  Out: 1500 [Urine:1500] Intake/Output this shift:   Nutritional status: General  Neurologic Exam: Mental Status:  Lethargic.  Minimal speech.   Follows simple commands without difficulty.  Cranial Nerves:  II: Discs flat bilaterally; Visual fields grossly normal, pupils equal, round, reactive to light and accommodation  III,IV, VI: ptosis not present, extra-ocular motions intact bilaterally  V,VII: smile symmetric, facial light touch sensation normal bilaterally  VIII: hearing normal bilaterally  IX,X: gag reflex present  XI: bilateral shoulder shrug  XII: midline tongue extension  Motor:  5/5 throughout Tone and bulk:normal tone throughout; no atrophy noted  Sensory: Pinprick and light touch intact throughout, bilaterally  Deep Tendon Reflexes: 2+ and symmetric throughout  Plantars:  Right: downgoing Left: downgoing  Cerebellar:  normal finger-to-nose and normal heel-to-shin test   Lab Results: Basic Metabolic Panel: No results found for this basename: NA:5,K:5,CL:5,CO2:5,GLUCOSE:5,BUN:5,CREATININE:5,CALCIUM:3,MG:5,PHOS:5 in the last 168 hours  Liver Function Tests: No results found for this basename:  AST:5,ALT:5,ALKPHOS:5,BILITOT:5,PROT:5,ALBUMIN:5 in the last 168 hours No results found for this basename: LIPASE:5,AMYLASE:5 in the last 168 hours No results found for this basename: AMMONIA:3 in the last 168 hours  CBC: No results found for this basename: WBC:5,NEUTROABS:5,HGB:5,HCT:5,MCV:5,PLT:5 in the last 168 hours  Cardiac Enzymes: No results found for this basename: CKTOTAL:5,CKMB:5,CKMBINDEX:5,TROPONINI:5 in the last 168 hours  Lipid Panel: No results found for this basename: CHOL:5,TRIG:5,HDL:5,CHOLHDL:5,VLDL:5,LDLCALC:5 in the last 168 hours  CBG: No results found for this basename: GLUCAP:5 in the last 168 hours  Microbiology: Results for orders placed during the hospital encounter of 03/26/12  MRSA PCR SCREENING     Status: Normal   Collection Time   03/31/12  3:47 PM      Component Value Range Status Comment   MRSA by PCR NEGATIVE  NEGATIVE Final     Coagulation Studies: No results found for this basename: LABPROT:5,INR:5 in the last 72 hours  Imaging: No results found.  Medications:  I have reviewed the patient's current medications. Scheduled:   . docusate sodium  100 mg Oral BID  . folic acid  1 mg Oral Daily  . lacosamide  100 mg Oral BID  . lacosamide  200 mg Oral BID  . levETIRAcetam  2,000 mg Oral Q12H  . LORazepam  0.75 mg Oral Q8H  . [COMPLETED] morphine  30 mg Oral Once  . [COMPLETED] oxyCODONE  5 mg Oral Once  . phenytoin  200 mg Oral BID  . Rivaroxaban  20 mg Oral Q supper  . sertraline  50 mg Oral Daily  . thiamine  100 mg Oral Daily  . topiramate  100 mg Oral BID  Assessment/Plan:  Patient Active Hospital Problem List: Seizure disorder (03/07/2012)   Assessment: Patient continues to have breakthrough seizures on Keppra, Vimpat, Dilantin and Topamax.  Receiving scheduled Ativan as well.  Last Dilantin level able to be obtained was on 11/25 and was 11.9.  Due to refusals unclear where his levels are at this time.  Concerned about chronic  use of Ativan since this will become less useful over time and will make a rescue for him less effective.   Plan:  1.  Phenobarbital 60mg  qhs-first dose tonight.  If helpful will consider tapering of the Ativan.   2.  Continue remaining anticonvulsants.       LOS: 14 days   Thana Farr, MD [facility] [phone] 04/09/2012  8:56 AM

## 2012-04-10 LAB — RAPID URINE DRUG SCREEN, HOSP PERFORMED
Amphetamines: NOT DETECTED
Benzodiazepines: NOT DETECTED
Opiates: POSITIVE — AB

## 2012-04-10 NOTE — Progress Notes (Signed)
Clinical Social Work  CSW attempted to meet with patient at bedside. Patient in bed sleeping with all lights off. Patient reports that he has a headache, does not want the lights turned on and does not feel he can participate in session at this time. CSW will continue to follow.  [place], [place] [phone]

## 2012-04-10 NOTE — Progress Notes (Signed)
Around 4:41am received phone call from Unit Secretary that resident is sniffing a powder substance off the overbed table. Entered room with nurse and nurse tech. Patient had his hand over the substance and would not remove it after several requests to do so. Patient tried to brush powder substance off overbed table. Patient was questioned several times by this nurse and patient refused to answer. Asked patient " Is that the pills I gave you earlier?" Patient stated " No. Those are not the pills you gave me." Security, Selena Batten the supervisor and Artist Beach NP were all informed of incident. Security and Selena Batten the Supervisor came to the unit and searched the patient room. Patient's home meds were found and given to this Clinical research associate. Medication reconcilliation form filled out and signed by this nurse and patient . Explained to patient that his medication will be returned to him upon discharge. Home medication taken to pharmcacy. Receipt on chart. Due meds given as per order. Asked resident to open mouth and lift tongue to ensure he swallowed meds. No hidden meds observed, witnessed by another nurse

## 2012-04-10 NOTE — Progress Notes (Signed)
[facility] Progress Note [name] [name] TEAM 4    [name] [name] AOZ:[id number] DOB: 1980-09-09 DOA: 03/26/2012 PCP: Sheila Oats, MD  Brief narrative: 31 year old man admitted for refractory epilepsy from [facility] where he was treated for depression, SI. Hospitalization prolonged secondary to recurrent seizures despite 4 antiepileptics. IVC renewed 11/25 per Psychiatry for depression, SI.  Although stable after prolonged seizure 11/25 PM, access remained a problem, seizure severity seemed to be worsening overall, and patient continued to intermittently and unpredictably refuse care. Patient had not gone >24 hours without a seizure since admission up to 11/26. He remained under IVC. Until seizures are controlled it was thought prudent to transfer to [facility] where Neurology is based. .  11/20 4 recorded seizures  11/21 3 recorded seizures  11/22 1 recorded seizure  11/23 5 recorded seizures  11/24 2+ recorded seizures  11/25 seizure complex ~40 minutes requiring 8 mg IM Ativan  11/29 - 0630am - seizure 11/30 - 07:00am seizure x2 12/02 - 02:00 seizure 12/05  - 2 generalized seizures 2 mins each approx 220am  Assessment/Plan:  Seizure disorder / refractory epilepsy As per Neurology Service - will need 72hrs seizure free prior to clearing for d/c home - some concern that he is having pseudoseizures- over the past few days pt has been reporting seizures (apparently has a strong aura) but no way to actually visualize them in this room- Patient to be transfered to camera room.  Patient with 2 generalized seizure activity early this morning. Phenobarbital added regimen per neurology. Continue current medications with vimpat, keppra, dilantin, topamax. Neurology titrating medications. Per Neuro.  Major depressive disorder/SI Patient denies any suicidal ideations. per Psychiatry who continues to follow the patient - unfortunately, it appears that the pt states per psych report his step father is  going to visit today and may stay with him briefly after discharge.  Psych is suggesting that pt remain under IVC and that we pursue inpt psych hospitalization once cleared medically - sertraline dosing at the suggestion of Pscyh.  DVT RUE Xarelto - patient has refused lab draws, therefore not a warfarin candidate  Difficult IV access unable to establish peripheral IV despite multiple attempts 11/24 - per Dr. 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 Could not access a collateral vein in right neck. Attempt made to access left brachial vein above elbow. [Given DVT RUE] may have to use femoral vein in future for access." - at present there is no absolute requirement for an IV (seiz has been arrested previously w/ IM ativan)  Extensive dental caries per Dr. Kristin Bruins, patient needs outpatient oral surgeon - Dr. Florene Route as outpatient for possible dental extraction for dental caries after transfer to behavioral health - appointment with Dr. Florene Route can be arranged at [phone].  Alcohol abuse  Well beyond the window for withdrawal. Stable.  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 Patient to follow up with MD managing his pain medications as outpatient on discharge.  Code Status: FULL Disposition Plan: transfer to neuro- camera room today Consultants: Neuro Psych  Procedures: none  Antibiotics: none  DVT prophylaxis: xarelto  HPI/Subjective: Patient with no generalized seizures over the past 24 hours. Events overnight noted. Patient sleeping but easily arousable.    Objective: Blood pressure 100/42, pulse 59, temperature 97.6 F (36.4 C), temperature source Oral, resp. rate 17, height 5\' 8"  (  1.727 m), weight 69.9 kg (154 lb 1.6 oz), SpO2 96.00%.  Intake/Output Summary (Last 24 hours) at 04/10/12  1806 Last data filed at 04/10/12 1724  Gross per 24 hour  Intake      0 ml  Output      0 ml  Net      0 ml     Exam: Gen- appears depressed- oriented, no acute distress CVS- RRR, no murmurs GI- soft, NT, ND, BS+ Resp-  CTA b/l Ext- no c/c/e  Data Reviewed: Basic Metabolic Panel: No results found for this basename: NA:5,K:5,CL:5,CO2:5,GLUCOSE:5,BUN:5,CREATININE:5,CALCIUM:5,MG:5,PHOS:5 in the last 168 hours Liver Function Tests: No results found for this basename: AST:5,ALT:5,ALKPHOS:5,BILITOT:5,PROT:5,ALBUMIN:5 in the last 168 hours CBC: No results found for this basename: WBC:5,NEUTROABS:5,HGB:5,HCT:5,MCV:5,PLT:5 in the last 168 hours  No results found for this or any previous visit (from the past 240 hour(s)).   Studies:  Recent x-ray studies have been reviewed in detail by the Attending Physician  Scheduled Meds:  Reviewed in detail by the Attending Physician   Ramiro Harvest, MD [phone]  If 7PM-7AM, please contact night-coverage [email or web address] Password [email or web address] 04/10/2012, 6:06 PM   LOS: 15 days

## 2012-04-10 NOTE — Progress Notes (Signed)
Patient Identification:  [name] [name] Date of Evaluation:  04/10/2012 Reason for Consult: Found with ?poweder on bed table.  Referring Provider: Dr. Janee Morn  History of Present Illness:Pt was ransferred to [facility] following admission to [facility] were he began having a seizure.  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  He was transferred to [facility] where neurology is based.   Past Psychiatric History: He has a history of pain medication/medication seeking.  He has refused diagnostic studies, does not agree to certain treatment routines  Past Medical History:     Past Medical History  Diagnosis Date  . Seizure   . Protein C deficiency   . Protein C deficiency        Past Surgical History  Procedure Date  . Insertion of vena cava filter     Allergies:  Allergies  Allergen Reactions  . Fish Allergy Anaphylaxis and Rash  . Depakote (Divalproex Sodium) Swelling  . Morphine And Related Hives    Pt tolerated IV morphine 03/25/2012 admission. Pt tolerated PO morphine 03/29/2012.    Current Medications:  Prior to Admission medications   Medication Sig Start Date End Date Taking? Authorizing Provider  lacosamide 100 MG TABS Take 1 tablet (100 mg total) by mouth 2 (two) times daily. 03/16/12  Yes Meredeth Ide, MD  levETIRAcetam (KEPPRA) 750 MG tablet Take 2 tablets (1,500 mg total) by mouth 2 (two) times daily. 03/16/12  Yes Meredeth Ide, MD  morphine (MSIR) 30 MG tablet Take 30 mg by mouth every 6 (six) hours as needed. Pain.  Patient reports taking prior to admission, with Rx from [facility] in [place]. Unable to verify. Last pain med at [facility] [phone]) was Oxycodone in 2011;  Patient states he has a supply at home, and will NOT need a prescription for this at discharge.   Yes Historical Provider, MD  phenytoin (DILANTIN) 100 MG ER capsule Take 2 capsules (200 mg total) by mouth 2 (two) times daily.  03/16/12  Yes Meredeth Ide, MD  Rivaroxaban (XARELTO) 20 MG TABS Take 20 mg by mouth daily with supper. Took 15 mg BID for 3 weeks (begun 11/19). To continue on 20 mg daily with supper.   Yes Historical Provider, MD    Social History:    reports that he has been smoking Cigarettes.  He has a 1.25 pack-year smoking history. He has never used smokeless tobacco. He reports that he drinks about 3.6 ounces of alcohol per week. He reports that he uses illicit drugs (Marijuana).   Family History:    History reviewed. No pertinent family history.  Mental Status Examination/Evaluation: Objective:  Appearance: Casual and in a darkened room  Eye Contact::  NA  Speech:  Clear and Coherent, Normal Rate and limited comments  Volume:  Decreased  Mood:  blocked  Affect:  Constricted  Thought Process:  Intact  Orientation:  Other:  unable to explain observation of power on table; observed 'sniffing'  Denies home meds in room; nted documentation and removal of home med searlier  Thought Content:  Ideas of Reference:   Delusions and Denial of earlier observations   Suicidal Thoughts:  No  Homicidal Thoughts:  No  Judgement:  Impaired  Insight:  Lacking   DIAGNOSIS:   AXIS I  Congenital seizure disorder; pain medication dependence  AXIS II  Deferred  AXIS III See medical notes.  AXIS IV other psychosocial or environmental problems, problems related to social  environment and uncooperative with diagnostics, tests part of treatment  AXIS V 41-50 serious symptoms   Assessment/Plan:  Discussed with Psych CSW and afternoon note/tests noted Pt is in darkened room. Pt has slow, low-pitched speech.  He says he is not feeling good today; denies recent seizure, headached.  Speech is limited but coherent.  He denies he was using home meds.   He Says he was only taking 'ordered meds'.  [this does not explain + barbiturates] included in the  Toxicology test with + opiates - which he does get.    The fact that  he has had home meds all this time, hidden, is another act of behavior driven by his  Own agenda.  Home meds are registered and stored in pharmacy. RECOMMENDATION:  1.  Pt  Is med seeking and now med 'using' 2.  Suggest continue IVC - understood that the 11/25 IVC was renewed by Psych CSW - needs to be renewed. 3.  Will follow Marleta Lapierre MD 04/10/2012 9:45 PM

## 2012-04-11 MED ORDER — LORAZEPAM 0.5 MG PO TABS
0.5000 mg | ORAL_TABLET | Freq: Three times a day (TID) | ORAL | Status: DC
  Administered 2012-04-11 – 2012-04-13 (×6): 0.5 mg via ORAL
  Filled 2012-04-11 (×6): qty 1

## 2012-04-11 NOTE — Consult Note (Signed)
Patient Identification:  [name] [name] Date of Evaluation:  04/11/2012 Reason for Consult:  Seizure disorder, depression  Referring Provider: Dr. Betti Cruz  Interval Hx;  Pt is transferred from [facility] to [facility] due to recurrent seizures.  Thinks he is feeling down and does not want to talk with anyone today. Reports no safety issues.     Past Psychiatric History: Congenital seizure disorder, Medication seeking [asks for stimulant for ADHD], receives pain medication, drinks beer- 2-3 beers; denies tobacco, recreational drugs.   Past Medical History:     Past Medical History  Diagnosis Date  . Seizure   . Protein C deficiency   . Protein C deficiency        Past Surgical History  Procedure Date  . Insertion of vena cava filter     Allergies:  Allergies  Allergen Reactions  . Fish Allergy Anaphylaxis and Rash  . Depakote (Divalproex Sodium) Swelling  . Morphine And Related Hives    Pt tolerated IV morphine 03/25/2012 admission. Pt tolerated PO morphine 03/29/2012.    Current Medications:  Prior to Admission medications   Medication Sig Start Date End Date Taking? Authorizing Provider  lacosamide 100 MG TABS Take 1 tablet (100 mg total) by mouth 2 (two) times daily. 03/16/12   Meredeth Ide, MD  levETIRAcetam (KEPPRA) 750 MG tablet Take 2 tablets (1,500 mg total) by mouth 2 (two) times daily. 03/16/12   Meredeth Ide, MD  phenytoin (DILANTIN) 100 MG ER capsule Take 2 capsules (200 mg total) by mouth 2 (two) times daily. 03/16/12   Meredeth Ide, MD  Rivaroxaban (XARELTO) 15 MG TABS tablet Take 1 tablet (15 mg total) by mouth 2 (two) times daily. Take 15 mg po daily for 20 days then  Continue to take 20 mg po daily 03/16/12   Meredeth Ide, MD  sulfamethoxazole-trimethoprim (BACTRIM DS) 800-160 MG per tablet Take 1 tablet by mouth every 12 (twelve) hours. 03/16/12   Meredeth Ide, MD  thiamine 100 MG tablet Take 1 tablet (100 mg total) by mouth daily. 03/16/12   Meredeth Ide, MD   traMADol (ULTRAM) 50 MG tablet Take 1 tablet (50 mg total) by mouth every 6 (six) hours as needed. 03/16/12   Meredeth Ide, MD    Social History:    reports that he has been smoking Cigarettes.  He has a 1.25 pack-year smoking history. He has never used smokeless tobacco. He reports that he drinks about 3.6 ounces of alcohol per week. He reports that he uses illicit drugs (Marijuana).   Family History:    History reviewed. No pertinent family history.     Mental Status Examination/Evaluation:  Appearance: on bed  Eye Contact:: Good  Speech: normal  Volume: Normal  Mood: angry  Affect: ristricted  Thought Process: organized  Orientation: Full  Thought Content: NO AVH  Suicidal Thoughts: No  Homicidal Thoughts: no  Memory: fair  Judgement: Impaired  Insight: Lacking  Psychomotor Activity: Normal  Concentration: Fair  Recall: Fair  Akathisia: No DIAGNOSIS:   Congenital Seizure Disorder; Pain medication addiction, Drug-seeking behavior History of non-adherence to medical treatments, diagnostics and management recommendations.    RECOMMENDATION:    1.  Will follow pt. As needed  Wonda Cerise MD 04/11/2012 8:20 PM      Review of Systems  HENT: Negative.      Physical Exam

## 2012-04-11 NOTE — Progress Notes (Signed)
Subjective: Patient with last documented seizure on 12/5.  Phenobarbital has been started at a low dose and patient does not report any side effects.  Discussed with pharmacy the possible interaction between Phenobarbital and Xarelto.  Anticonvulsant options are limited at this point and if can obtain some benefit from low dose usage will attempt to continue.    Objective: Current vital signs: BP 101/52  Pulse 62  Temp 98 F (36.7 C) (Oral)  Resp 18  Ht 5\' 8"  (1.727 m)  Wt 69.9 kg (154 lb 1.6 oz)  BMI 23.43 kg/m2  SpO2 98% Vital signs in last 24 hours: Temp:  [97.4 F (36.3 C)-98 F (36.7 C)] 98 F (36.7 C) (12/07 1039) Pulse Rate:  [59-68] 62  (12/07 1039) Resp:  [16-18] 18  (12/07 1039) BP: (96-101)/(42-58) 101/52 mmHg (12/07 1039) SpO2:  [97 %-98 %] 98 % (12/07 1039) Weight:  [69.9 kg (154 lb 1.6 oz)] 69.9 kg (154 lb 1.6 oz) (12/07 0500)  Intake/Output from previous day:   Intake/Output this shift:   Nutritional status: General  Neurologic Exam: Mental Status:  Lethargic. Minimal speech. Follows simple commands without difficulty.  Cranial Nerves:  II: Discs flat bilaterally; Visual fields grossly normal, pupils equal, round, reactive to light and accommodation  III,IV, VI: ptosis not present, extra-ocular motions intact bilaterally  V,VII: smile symmetric, facial light touch sensation normal bilaterally  VIII: hearing normal bilaterally  IX,X: gag reflex present  XI: bilateral shoulder shrug  XII: midline tongue extension  Motor:  5/5 throughout  Tone and bulk:normal tone throughout; no atrophy noted  Sensory: Pinprick and light touch intact throughout, bilaterally  Deep Tendon Reflexes: 2+ and symmetric throughout  Plantars:  Right: downgoing     Left: downgoing  Cerebellar:  normal finger-to-nose and normal heel-to-shin test    Lab Results: Basic Metabolic Panel: No results found for this basename:  NA:5,K:5,CL:5,CO2:5,GLUCOSE:5,BUN:5,CREATININE:5,CALCIUM:3,MG:5,PHOS:5 in the last 168 hours  Liver Function Tests: No results found for this basename: AST:5,ALT:5,ALKPHOS:5,BILITOT:5,PROT:5,ALBUMIN:5 in the last 168 hours No results found for this basename: LIPASE:5,AMYLASE:5 in the last 168 hours No results found for this basename: AMMONIA:3 in the last 168 hours  CBC: No results found for this basename: WBC:5,NEUTROABS:5,HGB:5,HCT:5,MCV:5,PLT:5 in the last 168 hours  Cardiac Enzymes: No results found for this basename: CKTOTAL:5,CKMB:5,CKMBINDEX:5,TROPONINI:5 in the last 168 hours  Lipid Panel: No results found for this basename: CHOL:5,TRIG:5,HDL:5,CHOLHDL:5,VLDL:5,LDLCALC:5 in the last 168 hours  CBG: No results found for this basename: GLUCAP:5 in the last 168 hours  Microbiology: Results for orders placed during the hospital encounter of 03/26/12  MRSA PCR SCREENING     Status: Normal   Collection Time   03/31/12  3:47 PM      Component Value Range Status Comment   MRSA by PCR NEGATIVE  NEGATIVE Final     Coagulation Studies: No results found for this basename: LABPROT:5,INR:5 in the last 72 hours  Imaging: No results found.  Medications:  I have reviewed the patient's current medications. Scheduled:   . docusate sodium  100 mg Oral BID  . folic acid  1 mg Oral Daily  . lacosamide  100 mg Oral BID  . lacosamide  200 mg Oral BID  . levETIRAcetam  2,000 mg Oral Q12H  . LORazepam  0.75 mg Oral Q8H  . phenobarbital  64.8 mg Oral QHS  . phenytoin  200 mg Oral BID  . Rivaroxaban  20 mg Oral Q supper  . sertraline  50 mg Oral Daily  . thiamine  100 mg Oral Daily  . topiramate  100 mg Oral BID    Assessment/Plan:  Patient Active Hospital Problem List: Seizure disorder (03/07/2012)   Assessment: Will continue current anticonvulsant medications at current dosage.   Plan:  1.  Continue seizure precautions  2.  Would start attempt to taper ativan-decrease to 0.5mg   TID    LOS: 16 days   Thana Farr, MD [facility] [phone] 04/11/2012  12:52 PM

## 2012-04-11 NOTE — Progress Notes (Signed)
[facility] Progress Note [name] TEAM 4    [name] [name] ZOX:[id number] DOB: Oct 26, 1980 DOA: 03/26/2012 PCP: Sheila Oats, MD  Brief narrative: 31 year old man admitted for refractory epilepsy from [facility] where he was treated for depression, SI. Hospitalization prolonged secondary to recurrent seizures despite 4 antiepileptics. IVC renewed 11/25 per Psychiatry for depression, SI.  Although stable after prolonged seizure 11/25 PM, access remained a problem, seizure severity seemed to be worsening overall, and patient continued to intermittently and unpredictably refuse care. Patient had not gone >24 hours without a seizure since admission up to 11/26. He remained under IVC. Until seizures are controlled it was thought prudent to transfer to [facility] where Neurology is based. .  11/20 4 recorded seizures  11/21 3 recorded seizures  11/22 1 recorded seizure  11/23 5 recorded seizures  11/24 2+ recorded seizures  11/25 seizure complex ~40 minutes requiring 8 mg IM Ativan  11/29 - 0630am - seizure 11/30 - 07:00am seizure x2 12/02 - 02:00 seizure 12/05  - 2 generalized seizures 2 mins each approx 220am  Assessment/Plan:  Seizure disorder / refractory epilepsy As per Neurology Service - will need 72hrs seizure free prior to clearing for d/c home - some concern that he is having pseudoseizures- over the past few days pt has been reporting seizures (apparently has a strong aura) but no way to actually visualize them in this room- Patient to be transfered to camera room.  Patient with 2 generalized seizure activity early this morning. Phenobarbital added regimen per neurology. Continue current medications with vimpat, keppra, dilantin, topamax. Neurology titrating medications. Taper Ativan per neurology recommendations. Per Neuro.  Major depressive disorder/SI Patient denies any suicidal ideations. per Psychiatry who continues to follow the patient - unfortunately, it appears that the pt  states per psych report his step father is going to visit today and may stay with him briefly after discharge.  Psych is suggesting that pt remain under IVC and that we pursue inpt psych hospitalization once cleared medically - sertraline dosing at the suggestion of Pscyh.  DVT RUE Xarelto - patient has refused lab draws, therefore not a warfarin candidate  Difficult IV access unable to establish peripheral IV despite multiple attempts 11/24 - per Dr. 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 Could not access a collateral vein in right neck. Attempt made to access left brachial vein above elbow. [Given DVT RUE] may have to use femoral vein in future for access." - at present there is no absolute requirement for an IV (seiz has been arrested previously w/ IM ativan)  Extensive dental caries per Dr. Kristin Bruins, patient needs outpatient oral surgeon - Dr. Florene Route as outpatient for possible dental extraction for dental caries after transfer to behavioral health - appointment with Dr. Florene Route can be arranged at [phone].  Alcohol abuse  Well beyond the window for withdrawal. Stable.  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 Patient to follow up with MD managing his pain medications as outpatient on discharge.  Code Status: FULL Disposition Plan: Per PSYCHIATRIC RECS. Consultants: Neuro Psych  Procedures: none  Antibiotics: none  DVT prophylaxis: xarelto  HPI/Subjective: Patient with no generalized seizures over the past 24-48 hours.  Patient sleeping but easily arousable.    Objective: Blood pressure 110/57, pulse 74, temperature 97.4 F (36.3 C), temperature source Oral, resp. rate 17, height 5\' 8"  (  1.727 m), weight 69.9 kg (154 lb 1.6 oz), SpO2 100.00%.  Intake/Output Summary (Last 24 hours) at  04/11/12 1446 Last data filed at 04/10/12 1724  Gross per 24 hour  Intake      0 ml  Output      0 ml  Net      0 ml     Exam: Gen- appears depressed- oriented, no acute distress CVS- RRR, no murmurs GI- soft, NT, ND, BS+ Resp-  CTA b/l Ext- no c/c/e  Data Reviewed: Basic Metabolic Panel: No results found for this basename: NA:5,K:5,CL:5,CO2:5,GLUCOSE:5,BUN:5,CREATININE:5,CALCIUM:5,MG:5,PHOS:5 in the last 168 hours Liver Function Tests: No results found for this basename: AST:5,ALT:5,ALKPHOS:5,BILITOT:5,PROT:5,ALBUMIN:5 in the last 168 hours CBC: No results found for this basename: WBC:5,NEUTROABS:5,HGB:5,HCT:5,MCV:5,PLT:5 in the last 168 hours  No results found for this or any previous visit (from the past 240 hour(s)).   Studies:  Recent x-ray studies have been reviewed in detail by the Attending Physician  Scheduled Meds:  Reviewed in detail by the Attending Physician   Ramiro Harvest, MD [phone]  If 7PM-7AM, please contact night-coverage [email or web address] Password [email or web address] 04/11/2012, 2:46 PM   LOS: 16 days

## 2012-04-12 NOTE — Progress Notes (Signed)
Pt stated that he actually felt better after the blinds were open, stated he was initally upset, but admitted this evening that he had started feeling better!!

## 2012-04-12 NOTE — Consult Note (Signed)
Patient Identification:  [name] [name] Date of Evaluation:  04/12/2012 Reason for Consult:  Seizure disorder, depression  Referring Provider: Dr. Betti Cruz  Interval Hx;  Seen today. Reports better mood but stay in the room most of the time. Feels tired being in the hospital now.  Thinks he is more happy now as has no Sz recently.  Reports no safety issues.     Past Psychiatric History: Congenital seizure disorder, Medication seeking [asks for stimulant for ADHD], receives pain medication, drinks beer- 2-3 beers; denies tobacco, recreational drugs.   Past Medical History:     Past Medical History  Diagnosis Date  . Seizure   . Protein C deficiency   . Protein C deficiency        Past Surgical History  Procedure Date  . Insertion of vena cava filter     Allergies:  Allergies  Allergen Reactions  . Fish Allergy Anaphylaxis and Rash  . Depakote (Divalproex Sodium) Swelling  . Morphine And Related Hives    Pt tolerated IV morphine 03/25/2012 admission. Pt tolerated PO morphine 03/29/2012.    Current Medications:  Prior to Admission medications   Medication Sig Start Date End Date Taking? Authorizing Provider  lacosamide 100 MG TABS Take 1 tablet (100 mg total) by mouth 2 (two) times daily. 03/16/12   Meredeth Ide, MD  levETIRAcetam (KEPPRA) 750 MG tablet Take 2 tablets (1,500 mg total) by mouth 2 (two) times daily. 03/16/12   Meredeth Ide, MD  phenytoin (DILANTIN) 100 MG ER capsule Take 2 capsules (200 mg total) by mouth 2 (two) times daily. 03/16/12   Meredeth Ide, MD  Rivaroxaban (XARELTO) 15 MG TABS tablet Take 1 tablet (15 mg total) by mouth 2 (two) times daily. Take 15 mg po daily for 20 days then  Continue to take 20 mg po daily 03/16/12   Meredeth Ide, MD  sulfamethoxazole-trimethoprim (BACTRIM DS) 800-160 MG per tablet Take 1 tablet by mouth every 12 (twelve) hours. 03/16/12   Meredeth Ide, MD  thiamine 100 MG tablet Take 1 tablet (100 mg total) by mouth daily.  03/16/12   Meredeth Ide, MD  traMADol (ULTRAM) 50 MG tablet Take 1 tablet (50 mg total) by mouth every 6 (six) hours as needed. 03/16/12   Meredeth Ide, MD    Social History:    reports that he has been smoking Cigarettes.  He has a 1.25 pack-year smoking history. He has never used smokeless tobacco. He reports that he drinks about 3.6 ounces of alcohol per week. He reports that he uses illicit drugs (Marijuana).   Family History:    History reviewed. No pertinent family history.     Mental Status Examination/Evaluation:  Appearance: on bed  Eye Contact:: Good  Speech: normal  Volume: Normal  Mood: angry  Affect: ristricted  Thought Process: organized  Orientation: Full  Thought Content: NO AVH  Suicidal Thoughts: No  Homicidal Thoughts: no  Memory: fair  Judgement: Impaired  Insight: Lacking  Psychomotor Activity: Normal  Concentration: Fair  Recall: Fair  Akathisia: No DIAGNOSIS:   Congenital Seizure Disorder; Pain medication addiction, Drug-seeking behavior History of non-adherence to medical treatments, diagnostics and management recommendations.    RECOMMENDATION:    1.  Will follow pt. As needed  Wonda Cerise MD 04/12/2012 4:26 PM      Review of Systems  HENT: Negative.      Physical Exam

## 2012-04-12 NOTE — Progress Notes (Signed)
Told patient that Dr. Janee Morn wanted him to start sitting up in chair, and opening the blinds on his window,  Blinds opened  No comment from patient at that time.  Later he verbalized that he was a "grown man", and if he wanted to sit in chair or have the  Blinds open he would open them, and if he wanted the closed he would get up and close them.  He was a "grown man",  Said he needed to get used to having some light in his room.

## 2012-04-12 NOTE — Progress Notes (Signed)
[facility] Progress Note [place] TEAM 4    [name] [name] WJX:[id number] DOB: 12-15-1980 DOA: 03/26/2012 PCP: Sheila Oats, MD  Brief narrative: 31 year old man admitted for refractory epilepsy from [facility] where he was treated for depression, SI. Hospitalization prolonged secondary to recurrent seizures despite 4 antiepileptics. IVC renewed 11/25 per Psychiatry for depression, SI.  Although stable after prolonged seizure 11/25 PM, access remained a problem, seizure severity seemed to be worsening overall, and patient continued to intermittently and unpredictably refuse care. Patient had not gone >24 hours without a seizure since admission up to 11/26. He remained under IVC. Until seizures are controlled it was thought prudent to transfer to [facility] where Neurology is based. .  11/20 4 recorded seizures  11/21 3 recorded seizures  11/22 1 recorded seizure  11/23 5 recorded seizures  11/24 2+ recorded seizures  11/25 seizure complex ~40 minutes requiring 8 mg IM Ativan  11/29 - 0630am - seizure 11/30 - 07:00am seizure x2 12/02 - 02:00 seizure 12/05  - 2 generalized seizures 2 mins each approx 220am  Assessment/Plan:  Seizure disorder / refractory epilepsy As per Neurology Service - will need 72hrs seizure free prior to clearing for d/c home - some concern that he is having pseudoseizures- over the past few days pt has been reporting seizures (apparently has a strong aura) but no way to actually visualize them in this room- Patient to be transfered to camera room.  Patient with 2 generalized seizure activity early this morning. Phenobarbital added regimen per neurology. Continue current medications with vimpat, keppra, dilantin, topamax. Neurology titrating medications. Taper Ativan per neurology recommendations. Per Neuro.  Major depressive disorder/SI Patient denies any suicidal ideations. per Psychiatry who continues to follow the patient -   Psych is suggesting that pt remain  under IVC and that we pursue inpt psych hospitalization once cleared medically - sertraline dosing at the suggestion of Pscyh.  DVT RUE Xarelto - patient has refused lab draws, therefore not a warfarin candidate  Difficult IV access unable to establish peripheral IV despite multiple attempts 11/24 - per Dr. 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 Could not access a collateral vein in right neck. Attempt made to access left brachial vein above elbow. [Given DVT RUE] may have to use femoral vein in future for access." - at present there is no absolute requirement for an IV (seiz has been arrested previously w/ IM ativan)  Extensive dental caries per Dr. Kristin Bruins, patient needs outpatient oral surgeon - Dr. Florene Route as outpatient for possible dental extraction for dental caries after transfer to behavioral health - appointment with Dr. Florene Route can be arranged at [phone].  Alcohol abuse  Well beyond the window for withdrawal. Stable.  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 Patient to follow up with MD managing his pain medications as outpatient on discharge.  Code Status: FULL Disposition Plan: Per PSYCHIATRIC RECS. Consultants: Neuro Psych  Procedures: none  Antibiotics: none  DVT prophylaxis: xarelto  HPI/Subjective: Patient with no generalized seizures over the past 48-96 hours.  Patient sleeping but easily arousable.    Objective: Blood pressure 115/69, pulse 65, temperature 97.7 F (36.5 C), temperature source Oral, resp. rate 19, height 5\' 8"  (1.727 m), weight 71 kg (156 lb 8.4 oz), SpO2 100.00%.  Intake/Output Summary (Last 24 hours) at 04/12/12 1655 Last data filed at 04/12/12  0400  Gross per 24 hour  Intake    920 ml  Output    650 ml  Net    270 ml     Exam: Gen- appears  depressed- oriented, no acute distress CVS- RRR, no murmurs GI- soft, NT, ND, BS+ Resp-  CTA b/l Ext- no c/c/e  Data Reviewed: Basic Metabolic Panel: No results found for this basename: NA:5,K:5,CL:5,CO2:5,GLUCOSE:5,BUN:5,CREATININE:5,CALCIUM:5,MG:5,PHOS:5 in the last 168 hours Liver Function Tests: No results found for this basename: AST:5,ALT:5,ALKPHOS:5,BILITOT:5,PROT:5,ALBUMIN:5 in the last 168 hours CBC: No results found for this basename: WBC:5,NEUTROABS:5,HGB:5,HCT:5,MCV:5,PLT:5 in the last 168 hours  No results found for this or any previous visit (from the past 240 hour(s)).   Studies:  Recent x-ray studies have been reviewed in detail by the Attending Physician  Scheduled Meds:  Reviewed in detail by the Attending Physician   Ramiro Harvest, MD [phone]  If 7PM-7AM, please contact night-coverage [email or web address] Password [email or web address] 04/12/2012, 4:55 PM   LOS: 17 days

## 2012-04-12 NOTE — Progress Notes (Signed)
Subjective: Remains stable.  Reports no further seizures since 12/5.  Tolerating the Phenobarbital at low dose.    Objective: Current vital signs: BP 105/61  Pulse 59  Temp 97.6 F (36.4 C) (Oral)  Resp 20  Ht 5\' 8"  (1.727 m)  Wt 71 kg (156 lb 8.4 oz)  BMI 23.80 kg/m2  SpO2 100% Vital signs in last 24 hours: Temp:  [97.4 F (36.3 C)-97.7 F (36.5 C)] 97.6 F (36.4 C) (12/08 0600) Pulse Rate:  [59-74] 59  (12/08 0600) Resp:  [17-20] 20  (12/08 0600) BP: (94-110)/(51-61) 105/61 mmHg (12/08 0600) SpO2:  [100 %] 100 % (12/08 0600) Weight:  [71 kg (156 lb 8.4 oz)] 71 kg (156 lb 8.4 oz) (12/08 0335)  Intake/Output from previous day: 12/07 0701 - 12/08 0700 In: 920 [P.O.:920] Out: 650 [Urine:650] Intake/Output this shift:   Nutritional status: General  Neurologic Exam: Mental Status:  Awake and alert.  Follows 3-step commands without difficulty.  Speech flent. Cranial Nerves:  II: Discs flat bilaterally; Visual fields grossly normal, pupils equal, round, reactive to light and accommodation  III,IV, VI: ptosis not present, extra-ocular motions intact bilaterally  V,VII: smile symmetric, facial light touch sensation normal bilaterally  VIII: hearing normal bilaterally  IX,X: gag reflex present  XI: bilateral shoulder shrug  XII: midline tongue extension  Motor:  5/5 throughout  Tone and bulk:normal tone throughout; no atrophy noted  Sensory: Pinprick and light touch intact throughout, bilaterally  Deep Tendon Reflexes: 2+ and symmetric throughout  Plantars:  Right: downgoing    Left: downgoing  Cerebellar:  normal finger-to-nose and normal heel-to-shin test    Lab Results: Basic Metabolic Panel: No results found for this basename: NA:5,K:5,CL:5,CO2:5,GLUCOSE:5,BUN:5,CREATININE:5,CALCIUM:3,MG:5,PHOS:5 in the last 168 hours  Liver Function Tests: No results found for this basename: AST:5,ALT:5,ALKPHOS:5,BILITOT:5,PROT:5,ALBUMIN:5 in the last 168 hours No results  found for this basename: LIPASE:5,AMYLASE:5 in the last 168 hours No results found for this basename: AMMONIA:3 in the last 168 hours  CBC: No results found for this basename: WBC:5,NEUTROABS:5,HGB:5,HCT:5,MCV:5,PLT:5 in the last 168 hours  Cardiac Enzymes: No results found for this basename: CKTOTAL:5,CKMB:5,CKMBINDEX:5,TROPONINI:5 in the last 168 hours  Lipid Panel: No results found for this basename: CHOL:5,TRIG:5,HDL:5,CHOLHDL:5,VLDL:5,LDLCALC:5 in the last 168 hours  CBG: No results found for this basename: GLUCAP:5 in the last 168 hours  Microbiology: Results for orders placed during the hospital encounter of 03/26/12  MRSA PCR SCREENING     Status: Normal   Collection Time   03/31/12  3:47 PM      Component Value Range Status Comment   MRSA by PCR NEGATIVE  NEGATIVE Final     Coagulation Studies: No results found for this basename: LABPROT:5,INR:5 in the last 72 hours  Imaging: No results found.  Medications:  I have reviewed the patient's current medications. Scheduled:   . docusate sodium  100 mg Oral BID  . folic acid  1 mg Oral Daily  . lacosamide  100 mg Oral BID  . lacosamide  200 mg Oral BID  . levETIRAcetam  2,000 mg Oral Q12H  . LORazepam  0.5 mg Oral Q8H  . phenobarbital  64.8 mg Oral QHS  . phenytoin  200 mg Oral BID  . Rivaroxaban  20 mg Oral Q supper  . sertraline  50 mg Oral Daily  . thiamine  100 mg Oral Daily  . topiramate  100 mg Oral BID  . [DISCONTINUED] LORazepam  0.75 mg Oral Q8H    Assessment/Plan:  Patient Active Hospital Problem List:  Seizure disorder (03/07/2012)   Assessment: Stable.    Plan: Continue current antiepileptics at current doses.      LOS: 17 days   Thana Farr, MD [facility] [phone] 04/12/2012  10:52 AM

## 2012-04-13 MED ORDER — LACOSAMIDE 50 MG PO TABS: 300.0000 mg | ORAL_TABLET | Freq: Two times a day (BID) | ORAL | Status: DC

## 2012-04-13 MED ORDER — LORAZEPAM 0.5 MG PO TABS
0.5000 mg | ORAL_TABLET | Freq: Two times a day (BID) | ORAL | Status: DC
  Administered 2012-04-13 – 2012-04-20 (×13): 0.5 mg via ORAL
  Filled 2012-04-13 (×13): qty 1

## 2012-04-13 MED ORDER — LACOSAMIDE 50 MG PO TABS
300.0000 mg | ORAL_TABLET | Freq: Two times a day (BID) | ORAL | Status: DC
  Administered 2012-04-14 – 2012-04-20 (×13): 300 mg via ORAL
  Filled 2012-04-13: qty 1
  Filled 2012-04-13: qty 5
  Filled 2012-04-13: qty 1
  Filled 2012-04-13 (×12): qty 6

## 2012-04-13 NOTE — Progress Notes (Signed)
Subjective: Patient awake and alert.  Has remained seizure free since 12/5.  On Phenobarbital and Ativan as well as Vimpat, Dilantin, Keppra and Topamax.  Objective: Current vital signs: BP 107/94  Pulse 78  Temp 97.6 F (36.4 C) (Oral)  Resp 20  Ht 5\' 8"  (1.727 m)  Wt 71 kg (156 lb 8.4 oz)  BMI 23.80 kg/m2  SpO2 100% Vital signs in last 24 hours: Temp:  [97.6 F (36.4 C)-98.1 F (36.7 C)] 97.6 F (36.4 C) (12/09 0600) Pulse Rate:  [65-78] 78  (12/09 0600) Resp:  [18-20] 20  (12/09 0600) BP: (107-116)/(58-94) 107/94 mmHg (12/09 0600) SpO2:  [100 %] 100 % (12/09 0600)  Intake/Output from previous day:   Intake/Output this shift:   Nutritional status: General  Neurologic Exam: Mental Status:  Awake and alert. Follows 3-step commands without difficulty. Speech flent.  Cranial Nerves:  II: Discs flat bilaterally; Visual fields grossly normal, pupils equal, round, reactive to light and accommodation  III,IV, VI: ptosis not present, extra-ocular motions intact bilaterally  V,VII: smile symmetric, facial light touch sensation normal bilaterally  VIII: hearing normal bilaterally  IX,X: gag reflex present  XI: bilateral shoulder shrug  XII: midline tongue extension  Motor:  5/5 throughout  Tone and bulk:normal tone throughout; no atrophy noted  Sensory: Pinprick and light touch intact throughout, bilaterally  Deep Tendon Reflexes: 2+ and symmetric throughout  Plantars:  Right: downgoing L   eft: downgoing  Cerebellar:  normal finger-to-nose and normal heel-to-shin test    Lab Results: Basic Metabolic Panel: No results found for this basename: NA:5,K:5,CL:5,CO2:5,GLUCOSE:5,BUN:5,CREATININE:5,CALCIUM:3,MG:5,PHOS:5 in the last 168 hours  Liver Function Tests: No results found for this basename: AST:5,ALT:5,ALKPHOS:5,BILITOT:5,PROT:5,ALBUMIN:5 in the last 168 hours No results found for this basename: LIPASE:5,AMYLASE:5 in the last 168 hours No results found for this  basename: AMMONIA:3 in the last 168 hours  CBC: No results found for this basename: WBC:5,NEUTROABS:5,HGB:5,HCT:5,MCV:5,PLT:5 in the last 168 hours  Cardiac Enzymes: No results found for this basename: CKTOTAL:5,CKMB:5,CKMBINDEX:5,TROPONINI:5 in the last 168 hours  Lipid Panel: No results found for this basename: CHOL:5,TRIG:5,HDL:5,CHOLHDL:5,VLDL:5,LDLCALC:5 in the last 168 hours  CBG: No results found for this basename: GLUCAP:5 in the last 168 hours  Microbiology: Results for orders placed during the hospital encounter of 03/26/12  MRSA PCR SCREENING     Status: Normal   Collection Time   03/31/12  3:47 PM      Component Value Range Status Comment   MRSA by PCR NEGATIVE  NEGATIVE Final     Coagulation Studies: No results found for this basename: LABPROT:5,INR:5 in the last 72 hours  Imaging: No results found.  Medications:  I have reviewed the patient's current medications. Scheduled:   . docusate sodium  100 mg Oral BID  . folic acid  1 mg Oral Daily  . lacosamide  100 mg Oral BID  . lacosamide  200 mg Oral BID  . levETIRAcetam  2,000 mg Oral Q12H  . LORazepam  0.5 mg Oral BID  . phenobarbital  64.8 mg Oral QHS  . phenytoin  200 mg Oral BID  . Rivaroxaban  20 mg Oral Q supper  . sertraline  50 mg Oral Daily  . thiamine  100 mg Oral Daily  . topiramate  100 mg Oral BID  . [DISCONTINUED] LORazepam  0.5 mg Oral Q8H    Assessment/Plan:  Patient Active Hospital Problem List: Seizure disorder (03/07/2012)   Assessment: This is his longest seizure free period since admission.  Has now been on  the Phenobarbital long enough that we can likely discontinue his nighttime Ativan and just continue its use during the day.  The Phenobarbital should be sufficient at night since the Ativan was not holding him previously anyway.  As an outpatient this can hopefully continued to be tapered until fully discontinued.     Plan: 1. Change Ativan to 0.5mg  in the morning and midday.   Phenobarbital to be continued at night.      LOS: 18 days   Thana Farr, MD [facility] [phone] 04/13/2012  7:32 AM

## 2012-04-13 NOTE — Progress Notes (Signed)
[facility] Progress Note [place] TEAM 4    [name] [name] EXB:[id number] DOB: Aug 31, 1980 DOA: 03/26/2012 PCP: Sheila Oats, MD  Brief narrative: 31 year old man admitted for refractory epilepsy from [facility] where he was treated for depression, SI. Hospitalization prolonged secondary to recurrent seizures despite 4 antiepileptics. IVC renewed 11/25 per Psychiatry for depression, SI.  Although stable after prolonged seizure 11/25 PM, access remained a problem, seizure severity seemed to be worsening overall, and patient continued to intermittently and unpredictably refuse care. Patient had not gone >24 hours without a seizure since admission up to 11/26. He remained under IVC. Until seizures are controlled it was thought prudent to transfer to [facility] where Neurology is based. .  11/20 4 recorded seizures  11/21 3 recorded seizures  11/22 1 recorded seizure  11/23 5 recorded seizures  11/24 2+ recorded seizures  11/25 seizure complex ~40 minutes requiring 8 mg IM Ativan  11/29 - 0630am - seizure 11/30 - 07:00am seizure x2 12/02 - 02:00 seizure 12/05  - 2 generalized seizures 2 mins each approx 220am  Assessment/Plan:  Seizure disorder / refractory epilepsy As per Neurology Service - will need 72hrs seizure free prior to clearing for d/c home - some concern that he is having pseudoseizures- over the past few days pt has been reporting no further seizures (apparently has a strong aura) since 04/09/12.  Patient in camera room.  Phenobarbital added regimen per neurology. Continue current medications with vimpat, keppra, dilantin, topamax. Neurology titrating medications. Taper Ativan per neurology recommendations. Per Neuro.  Major depressive disorder/SI Patient denies any suicidal ideations. per Psychiatry who continues to follow the patient -   Psych is suggesting that pt remain under IVC and that we pursue inpt psych hospitalization once cleared medically - sertraline dosing at the  suggestion of Pscyh.  DVT RUE Xarelto - patient has refused lab draws, therefore not a warfarin candidate  Difficult IV access unable to establish peripheral IV despite multiple attempts 11/24 - per Dr. 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 Could not access a collateral vein in right neck. Attempt made to access left brachial vein above elbow. [Given DVT RUE] may have to use femoral vein in future for access." - at present there is no absolute requirement for an IV (seiz has been arrested previously w/ IM ativan)  Extensive dental caries per Dr. Kristin Bruins, patient needs outpatient oral surgeon - Dr. Florene Route as outpatient for possible dental extraction for dental caries after transfer to behavioral health - appointment with Dr. Florene Route can be arranged at [phone].  Alcohol abuse  Well beyond the window for withdrawal. Stable.  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 Patient to follow up with MD managing his pain medications as outpatient on discharge.  Code Status: FULL Disposition Plan: Per PSYCHIATRIC RECS. Consultants: Neuro Psych  Procedures: none  Antibiotics: none  DVT prophylaxis: xarelto  HPI/Subjective: Patient with no generalized seizures since 12/5. Patient states opening the window made him feel better.     Objective: Blood pressure 107/94, pulse 78, temperature 97.6 F (36.4 C), temperature source Oral, resp. rate 20, height 5\' 8"  (1.727 m), weight 71 kg (156 lb 8.4 oz), SpO2 100.00%. No intake or output data in the 24 hours ending 04/13/12 1504   Exam: Gen- appears depressed- oriented, no acute distress CVS- RRR, no murmurs GI- soft, NT, ND, BS+  Resp-  CTA b/l Ext- no c/c/e  Data Reviewed: Basic Metabolic Panel: No results found for this basename:  NA:5,K:5,CL:5,CO2:5,GLUCOSE:5,BUN:5,CREATININE:5,CALCIUM:5,MG:5,PHOS:5 in the last 168 hours Liver Function Tests: No results found for this basename: AST:5,ALT:5,ALKPHOS:5,BILITOT:5,PROT:5,ALBUMIN:5 in the last 168 hours CBC: No results found for this basename: WBC:5,NEUTROABS:5,HGB:5,HCT:5,MCV:5,PLT:5 in the last 168 hours  No results found for this or any previous visit (from the past 240 hour(s)).   Studies:  Recent x-ray studies have been reviewed in detail by the Attending Physician  Scheduled Meds:  Reviewed in detail by the Attending Physician   Ramiro Harvest, MD [phone]  If 7PM-7AM, please contact night-coverage [email or web address] Password [email or web address] 04/13/2012, 3:04 PM   LOS: 18 days

## 2012-04-13 NOTE — Progress Notes (Signed)
MEDICATION RELATED CONSULT NOTE - INITIAL   Indication: Drug interaction between Xarelto and phenobarbital/phenytoin  Allergies  Allergen Reactions  . Fish Allergy Anaphylaxis and Rash  . Depakote (Divalproex Sodium) Swelling  . Morphine And Related Hives    Pt tolerated IV morphine 03/25/2012 admission. Pt tolerated PO morphine 03/29/2012.    Medications:  Scheduled:    . docusate sodium  100 mg Oral BID  . folic acid  1 mg Oral Daily  . lacosamide  100 mg Oral BID  . lacosamide  200 mg Oral BID  . levETIRAcetam  2,000 mg Oral Q12H  . LORazepam  0.5 mg Oral BID  . phenobarbital  64.8 mg Oral QHS  . phenytoin  200 mg Oral BID  . Rivaroxaban  20 mg Oral Q supper  . sertraline  50 mg Oral Daily  . thiamine  100 mg Oral Daily  . topiramate  100 mg Oral BID  . [DISCONTINUED] LORazepam  0.5 mg Oral Q8H    Assessment: 31 yo male with seizure disorder, very difficult to control seizures, currently on 5 anti-epileptic drugs and seizure-free since 12/5.  Also with RUE DVT on Xarelto.  Phenytoin and phenobarbital may lessen effect of Xarelto.  Patient is not a Coumadin candidate due to difficulty with lab draws, and poor potential for f/u with INR checks as outpatient.  Eliquis has a similar interaction profile as Xarelto.  Pradaxa is not approved for DVTs.  Plan:  I have spoken with Dr. Janee Morn from hospitalist service and Dr. Thad Ranger from neurology.  Both are aware of the potential interaction, but unfortunately have no alternatives for drug therapy at this time.  Please call if questions.  Thanks!  Gardner Candle 04/13/2012,1:54 PM Pager [phone] Phone [phone]

## 2012-04-13 NOTE — Progress Notes (Signed)
Clinical Social Work Progress Note PSYCHIATRY SERVICE LINE 04/13/2012  Patient:  [name] [name]  Account:  [id number]  Admit Date:  03/26/2012  Clinical Social Worker:  Unk Lightning, LCSW  Date/Time:  04/13/2012 02:15 PM  Review of Patient  Overall Medical Condition:   "I feel better and I have not had any seizures lately."    Attending MD feels patient is close to dc.   Participation Level:  Active  Participation Quality  Appropriate   Other Participation Quality:   Affect  Appropriate   Cognitive  Alert   Reaction to Medications/Concerns:   None reported   Modes of Intervention  Support   Summary of Progress/Plan at Discharge   CSW spoke with psych MD who reports that IVC paperwork needs to be renewed. MD reports that patient has been unpredictable during hospital stay but needs to ensure proper treatment of seizures is obtained before dc. CSW completed paperwork and faxed to the Magistrate.    CSW met with patient at bedside. No visitors present. Patient sitting in bed watching tv. Patient reports he is feeling better and has not had any seizures lately. Patient reports that he is ready to get home and to get back to work. Patient denies any SI and reports he feels more hopeful today. Patient reports that he has been speaking with stepdad who will assist him at dc. Patient reports that stepdad plans to visit tomorrow. Patient reports that he is interested in outpatient follow up at dc. Patient prefers counseling in [place] close to his home and work. CSW called [facility] in [place] and received referral for individual therapy, medication management and support groups. All information placed on patient's AVS.    CSW staffed case with psych MD who feels patient will benefit from outpatient services at dc.

## 2012-04-14 NOTE — Progress Notes (Signed)
Clinical Social Work Progress Note PSYCHIATRY SERVICE LINE 04/14/2012  Patient:  [name] [name]  Account:  [id number]  Admit Date:  03/26/2012  Clinical Social Worker:  Unk Lightning, LCSW  Date/Time:  04/14/2012 03:00 PM  Review of Patient  Overall Medical Condition:   "I feel better and want to go home."   Participation Level:  Active  Participation Quality  Appropriate   Other Participation Quality:   Affect  Appropriate   Cognitive  Appropriate   Reaction to Medications/Concerns:   Modes of Intervention  Support   Summary of Progress/Plan at Discharge   CSW met with patient at bedside. Patient sitting in bed and watching tv. Patient reports he feels better and cannot wait to dc from the hospital. Patient reports that step dad visited him last night and plans to transport him home from the hospital. Patient spoke with stepdad regarding plans at dc and is agreeable to stay with stepdad until he feels better.    Patient denies any SI or HI. Patient does report he feels depressed still but is unsure when depressive symptoms will go away. Patient agreeable to follow up counseling at dc in [place]. CSW reviewed information with patient who is agreeable to schedule appointment at dc. CSW provided patient support group information as well. Patient reports "I don't have a mental illness and I don't need a group." CSW explained how groups can be beneficial when grieving and sharing feelings with others. Patient agreed to take the information but is unsure if he will attend any groups.    CSW spoke with psych MD and informed MD of patient's follow up appointments.

## 2012-04-14 NOTE — Progress Notes (Signed)
Subjective: No further seizures noted.  Stable on current regimen.  No complaints.  Objective: Current vital signs: BP 105/51  Pulse 55  Temp 97.5 F (36.4 C) (Oral)  Resp 20  Ht 5\' 8"  (1.727 m)  Wt 70.1 kg (154 lb 8.7 oz)  BMI 23.50 kg/m2  SpO2 100% Vital signs in last 24 hours: Temp:  [97.4 F (36.3 C)-97.9 F (36.6 C)] 97.5 F (36.4 C) (12/10 0543) Pulse Rate:  [55-70] 55  (12/10 0543) Resp:  [18-20] 20  (12/10 0543) BP: (104-107)/(51-69) 105/51 mmHg (12/10 0543) SpO2:  [99 %-100 %] 100 % (12/10 0543) Weight:  [70.1 kg (154 lb 8.7 oz)] 70.1 kg (154 lb 8.7 oz) (12/10 0543)  Intake/Output from previous day:   Intake/Output this shift:   Nutritional status: General  Neurologic Exam: Mental Status:  Awake and alert. Follows 3-step commands without difficulty. Speech flent.  Cranial Nerves:  II: Discs flat bilaterally; Visual fields grossly normal, pupils equal, round, reactive to light and accommodation  III,IV, VI: ptosis not present, extra-ocular motions intact bilaterally  V,VII: smile symmetric, facial light touch sensation normal bilaterally  VIII: hearing normal bilaterally  IX,X: gag reflex present  XI: bilateral shoulder shrug  XII: midline tongue extension  Motor:  5/5 throughout  Tone and bulk:normal tone throughout; no atrophy noted  Sensory: Pinprick and light touch intact throughout, bilaterally  Deep Tendon Reflexes: 2+ and symmetric throughout  Plantars:  Right: downgoing L eft: downgoing  Cerebellar:  normal finger-to-nose and normal heel-to-shin test      Lab Results: Basic Metabolic Panel: No results found for this basename: NA:5,K:5,CL:5,CO2:5,GLUCOSE:5,BUN:5,CREATININE:5,CALCIUM:3,MG:5,PHOS:5 in the last 168 hours  Liver Function Tests: No results found for this basename: AST:5,ALT:5,ALKPHOS:5,BILITOT:5,PROT:5,ALBUMIN:5 in the last 168 hours No results found for this basename: LIPASE:5,AMYLASE:5 in the last 168 hours No results found  for this basename: AMMONIA:3 in the last 168 hours  CBC: No results found for this basename: WBC:5,NEUTROABS:5,HGB:5,HCT:5,MCV:5,PLT:5 in the last 168 hours  Cardiac Enzymes: No results found for this basename: CKTOTAL:5,CKMB:5,CKMBINDEX:5,TROPONINI:5 in the last 168 hours  Lipid Panel: No results found for this basename: CHOL:5,TRIG:5,HDL:5,CHOLHDL:5,VLDL:5,LDLCALC:5 in the last 168 hours  CBG: No results found for this basename: GLUCAP:5 in the last 168 hours  Microbiology: Results for orders placed during the hospital encounter of 03/26/12  MRSA PCR SCREENING     Status: Normal   Collection Time   03/31/12  3:47 PM      Component Value Range Status Comment   MRSA by PCR NEGATIVE  NEGATIVE Final     Coagulation Studies: No results found for this basename: LABPROT:5,INR:5 in the last 72 hours  Imaging: No results found.  Medications:  I have reviewed the patient's current medications. Scheduled:   . docusate sodium  100 mg Oral BID  . folic acid  1 mg Oral Daily  . lacosamide  300 mg Oral BID  . levETIRAcetam  2,000 mg Oral Q12H  . LORazepam  0.5 mg Oral BID  . phenobarbital  64.8 mg Oral QHS  . phenytoin  200 mg Oral BID  . Rivaroxaban  20 mg Oral Q supper  . sertraline  50 mg Oral Daily  . thiamine  100 mg Oral Daily  . topiramate  100 mg Oral BID  . [DISCONTINUED] lacosamide  100 mg Oral BID  . [DISCONTINUED] lacosamide  200 mg Oral BID  . [DISCONTINUED] lacosamide  300 mg Oral BID    Assessment/Plan:  Patient Active Hospital Problem List: Seizure disorder (03/07/2012)  Assessment: Stable on current regimen.  Reports that he can arrange transportation in the morning if discharge reasonable.    Plan:  1.  Patient now seizure free for since 12/5.  Neurologically stable for discharge.  Should continue outpatient follow up.      LOS: 19 days   Thana Farr, MD [facility] [phone] 04/14/2012  11:01 AM

## 2012-04-14 NOTE — Progress Notes (Signed)
[facility] Progress Note  TEAM 4    [name] [name] ZOX:[id number] DOB: 1980-12-22 DOA: 03/26/2012 PCP: Sheila Oats, MD  Brief narrative: 31 year old man admitted for refractory epilepsy from [facility] where he was treated for depression, SI. Hospitalization prolonged secondary to recurrent seizures despite 4 antiepileptics. IVC renewed 11/25 per Psychiatry for depression, SI.  Although stable after prolonged seizure 11/25 PM, access remained a problem, seizure severity seemed to be worsening overall, and patient continued to intermittently and unpredictably refuse care. Patient had not gone >24 hours without a seizure since admission up to 11/26. He remained under IVC. Until seizures are controlled it was thought prudent to transfer to [facility] where Neurology is based. .  11/20 4 recorded seizures  11/21 3 recorded seizures  11/22 1 recorded seizure  11/23 5 recorded seizures  11/24 2+ recorded seizures  11/25 seizure complex ~40 minutes requiring 8 mg IM Ativan  11/29 - 0630am - seizure 11/30 - 07:00am seizure x2 12/02 - 02:00 seizure 12/05  - 2 generalized seizures 2 mins each approx 220am  Assessment/Plan:  Seizure disorder / refractory epilepsy As per Neurology Service - will need 72hrs seizure free prior to clearing for d/c home - some concern that he is having pseudoseizures- over the past few days pt has been reporting no further seizures (apparently has a strong aura) since 04/09/12.  Patient in camera room.  Phenobarbital added regimen per neurology. Continue current medications with vimpat, keppra, dilantin, topamax,phenobarbital. Neurology titrating medications. Taper Ativan per neurology recommendations. Per Neuro.patient will likely need outpatient follow up with his neurologist.  Major depressive disorder/SI Patient denies any suicidal ideations. per Psychiatry who continues to follow the patient -   Psych feels patient without any further suicidal ideations.  Psych feels patient is competent. Psych feels patient may be discharged with outpatient followup possibly tomorrow if he remains medically stable. Patient does have a IVC and this would need to be discontinued on discharge. Sertraline dosing at the suggestion of Pscyh.  DVT RUE Xarelto - patient has refused lab draws, therefore not a warfarin candidate  Difficult IV access unable to establish peripheral IV despite multiple attempts 11/24 - per Dr. 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 Could not access a collateral vein in right neck. Attempt made to access left brachial vein above elbow. [Given DVT RUE] may have to use femoral vein in future for access." - at present there is no absolute requirement for an IV (seiz has been arrested previously w/ IM ativan)  Extensive dental caries per Dr. Kristin Bruins, patient needs outpatient oral surgeon - Dr. Florene Route as outpatient for possible dental extraction for dental caries after transfer to behavioral health - appointment with Dr. Florene Route can be arranged at [phone].  Alcohol abuse  Well beyond the window for withdrawal. Stable.  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 Patient to follow up with MD managing his pain medications as outpatient on discharge.  Code Status: FULL Disposition Plan: probable D/C tomorrow home if no further seizures. Consultants: Neuro Psych  Procedures: none  Antibiotics: none  DVT prophylaxis: xarelto  HPI/Subjective: Patient with no generalized seizures since 12/5. Patient states opening the window made him feel better.  Patient with no complaints. No seizures overnight.   Objective: Blood pressure 119/51, pulse 57, temperature 97.9 F (36.6 C), temperature source Oral, resp. rate 20, height  5\' 8"  (1.727 m), weight 70.1 kg (154  lb 8.7 oz), SpO2 100.00%. No intake or output data in the 24 hours ending 04/14/12 1904   Exam: Gen- appears less depressed- oriented, no acute distress CVS- RRR, no murmurs GI- soft, NT, ND, BS+ Resp-  CTA b/l Ext- no c/c/e  Data Reviewed: Basic Metabolic Panel: No results found for this basename: NA:5,K:5,CL:5,CO2:5,GLUCOSE:5,BUN:5,CREATININE:5,CALCIUM:5,MG:5,PHOS:5 in the last 168 hours Liver Function Tests: No results found for this basename: AST:5,ALT:5,ALKPHOS:5,BILITOT:5,PROT:5,ALBUMIN:5 in the last 168 hours CBC: No results found for this basename: WBC:5,NEUTROABS:5,HGB:5,HCT:5,MCV:5,PLT:5 in the last 168 hours  No results found for this or any previous visit (from the past 240 hour(s)).   Studies:  Recent x-ray studies have been reviewed in detail by the Attending Physician  Scheduled Meds:  Reviewed in detail by the Attending Physician   Ramiro Harvest, MD [phone]  If 7PM-7AM, please contact night-coverage [email or web address] Password [email or web address] 04/14/2012, 7:04 PM   LOS: 19 days

## 2012-04-14 NOTE — Progress Notes (Signed)
progress note Patient Identification:  Praveen Xxx-Riel Date of Evaluation:  04/14/2012 Reason for Consult:  Congenital Seizures  Referring Provider: Dr. Trula Slade  History of Present Illness:   Pt was ransferred to [facility] following admission to [facility] were he began having a seizure. 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 He was transferred to [facility] where neurology is based. His incidence of seizures have decreased.   Past Psychiatric History:Polysubstance abuse, non-adherence to medical regimen; spontaneous objections to tests/procedures.   Past Medical History:     Past Medical History  Diagnosis Date  . Seizure   . Protein C deficiency   . Protein C deficiency        Past Surgical History  Procedure Date  . Insertion of vena cava filter     Allergies:  Allergies  Allergen Reactions  . Fish Allergy Anaphylaxis and Rash  . Depakote (Divalproex Sodium) Swelling  . Morphine And Related Hives    Pt tolerated IV morphine 03/25/2012 admission. Pt tolerated PO morphine 03/29/2012.    Current Medications:  Prior to Admission medications   Medication Sig Start Date End Date Taking? Authorizing Provider  lacosamide 100 MG TABS Take 1 tablet (100 mg total) by mouth 2 (two) times daily. 03/16/12  Yes Meredeth Ide, MD  levETIRAcetam (KEPPRA) 750 MG tablet Take 2 tablets (1,500 mg total) by mouth 2 (two) times daily. 03/16/12  Yes Meredeth Ide, MD  morphine (MSIR) 30 MG tablet Take 30 mg by mouth every 6 (six) hours as needed. Pain.  Patient reports taking prior to admission, with Rx from [facility] in [place]. Unable to verify. Last pain med at [facility] [phone]) was Oxycodone in 2011;  Patient states he has a supply at home, and will NOT need a prescription for this at discharge.   Yes Historical Provider, MD  phenytoin (DILANTIN) 100 MG ER capsule Take 2 capsules (200 mg total) by mouth 2 (two) times  daily. 03/16/12  Yes Meredeth Ide, MD  Rivaroxaban (XARELTO) 20 MG TABS Take 20 mg by mouth daily with supper. Took 15 mg BID for 3 weeks (begun 11/19). To continue on 20 mg daily with supper.   Yes Historical Provider, MD    Social History:    reports that he has been smoking Cigarettes.  He has a 1.25 pack-year smoking history. He has never used smokeless tobacco. He reports that he drinks about 3.6 ounces of alcohol per week. He reports that he uses illicit drugs (Marijuana).   Family History:    History reviewed. No pertinent family history.  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  AXIS IV economic problems, occupational problems, other psychosocial or environmental problems, problems related to social environment and maintaining medication to sustain control of seizures.  AXIS V 41-50 serious symptoms   Assessment/Plan:  Discussed with patient 12/10-11/13 Pt seen 12/10  And he is very calm.   He  has had no seizures.  He wants to be quiet.  No SI.  Pt seen 12/11  Pt is smiling and sitting up.  He says he feels good.  His Step-father , his ;father'. Is coming to get him tonight.  He says he is glad to be leaving.  He has had no seizures; with a smile.  He denies suicidal thoughts. RECOMMENDATION:  1.  No change in discharge medications  2. No further psychiatric needs.   MD Psychiatrist signs off Ona Roehrs MD 04/14/2012 10:09 AM

## 2012-04-15 MED ORDER — SERTRALINE HCL 50 MG PO TABS: 50.0000 mg | ORAL_TABLET | Freq: Every day | ORAL | Status: DC

## 2012-04-15 MED ORDER — PHENYTOIN SODIUM EXTENDED 200 MG PO CAPS: 200.0000 mg | ORAL_CAPSULE | Freq: Two times a day (BID) | ORAL | Status: DC

## 2012-04-15 MED ORDER — PHENOBARBITAL 64.8 MG PO TABS: 64.8000 mg | ORAL_TABLET | Freq: Every day | ORAL | Status: DC

## 2012-04-15 MED ORDER — LACOSAMIDE 150 MG PO TABS: 300.0000 mg | ORAL_TABLET | Freq: Two times a day (BID) | ORAL | Status: DC

## 2012-04-15 MED ORDER — LORAZEPAM 0.5 MG PO TABS: 0.5000 mg | ORAL_TABLET | Freq: Two times a day (BID) | ORAL | Status: DC

## 2012-04-15 MED ORDER — TOPIRAMATE 100 MG PO TABS: 100.0000 mg | ORAL_TABLET | Freq: Two times a day (BID) | ORAL | Status: DC

## 2012-04-15 MED ORDER — LEVETIRACETAM 1000 MG PO TABS: 2000.0000 mg | ORAL_TABLET | Freq: Two times a day (BID) | ORAL | Status: DC

## 2012-04-15 MED ORDER — RIVAROXABAN 20 MG PO TABS: 20.0000 mg | ORAL_TABLET | Freq: Every day | ORAL | Status: DC

## 2012-04-15 NOTE — Care Management Note (Signed)
    Page 1 of 2   04/17/2012     4:29:00 PM   CARE MANAGEMENT NOTE 04/17/2012  Patient:  [name] [name],[name] [name]   Account Number:  [id number]  Date Initiated:  03/26/2012  Documentation initiated by:  DAVIS,RHONDA  Subjective/Objective Assessment:   pt having seizures witnessed in ed and icu ongoing     Action/Plan:   psych consult   Anticipated DC Date:  04/17/2012   Anticipated DC Plan:  HOME/SELF CARE  In-house referral  Clinical Social Worker      DC Associate Professor  CM consult  MATCH Program      Choice offered to / List presented to:  NA   DME arranged  NA      DME agency  NA     HH arranged  NA      HH agency  NA   Status of service:  Completed, signed off Medicare Important Message given?  NA - LOS <3 / Initial given by admissions (If response is "NO", the following Medicare IM given date fields will be blank) Date Medicare IM given:   Date Additional Medicare IM given:    Discharge Disposition:  HOME/SELF CARE  Per UR Regulation:  Reviewed for med. necessity/level of care/duration of stay  If discussed at Long Length of Stay Meetings, dates discussed:   04/07/2012  04/09/2012  04/14/2012    Comments:  04/17/12 Copay for patient's 8 rx's overrode per Assistant Director Nicolasa Ducking. [facility] is open 24hrs if patient's ride comes late. Jacquelynn Cree RN, BSN, CCM   04/16/12 Patient's ride did not pick him up, patient had 2 seizures. D/c cancelled, plan d/c for 04/17/12 if no further seizures. Jacquelynn Cree RN, BSN, CCM   04/15/12 Used Match program for patient to received his medications. Program explained to patient.Patient given Match letter, list of pharmacies and his prescriptions.Patient states that his uncle will be picking him up this pm after work at about 8:30pm . Jacquelynn Cree RN, BSN   04/03/12-  1530- Donn Pierini RN BSN [phone] Pt still requiring medication adjustments for sz activity. remains under IVC- plan to return  to [facility] when medically stable.  [id number]/FAOZHY Davis,RN,BSN,CCM: [facility] due to continued seizure activiy and no iv access.  [id number]/GEXBMW Earlene Plater, RN, BSN, CCM: CHART REVIEWED AND UPDATED.  Next chart review due on [id number]. NO DISCHARGE NEEDS PRESENT AT THIS TIME. CASE MANAGEMENT [phone]   [id number]/GLOVFI Earlene Plater, RN, BSN, CCM: CHART REVIEWED AND UPDATED.  Next chart review due on [id number]. NO DISCHARGE NEEDS PRESENT AT THIS TIME. CASE MANAGEMENT [phone]

## 2012-04-15 NOTE — Discharge Summary (Signed)
Physician Discharge Summary  [name] [name] QMV:[id number] DOB: Jan 04, 1981 DOA: 03/26/2012  PCP: Sheila Oats, MD  Admit date: 03/26/2012 Discharge date: 04/15/2012  Recommendations for Outpatient Follow-up:  1. Pt will need to follow up with PCP in 2 weeks post discharge 2. Followup with neurologist and psychiatrist in [facility] in 2 weeks 3. Follow up with dentist--Dr. Florene Route can be arranged at [phone].   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 Patient in camera room. Phenobarbital added regimen per neurology. Continue current medications with vimpat, keppra, dilantin, topamax,phenobarbital. Neurology titrating medications. Taper Ativan per neurology recommendations. Per Neuro.patient will likely need outpatient follow up with his neurologist.  Major depressive disorder/SI  Patient denies any suicidal ideations. per Psychiatry who continues to follow the patient - Psych feels patient without any further suicidal ideations. Psych feels patient is competent. Psych feels patient may be discharged with outpatient followup possibly tomorrow if he remains medically stable. Patient does have a IVC and this would need to be discontinued on discharge. Sertraline dosing at the suggestion of Pscyh.  DVT RUE  Xarelto - patient has refused lab draws, therefore not a warfarin candidate  Difficult IV access  unable to establish peripheral IV despite multiple attempts 11/24 - per Dr. 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 Could not access a collateral vein in right neck. Attempt made to access left brachial vein above elbow. [Given DVT RUE] may have to use femoral vein in future for access." - at present there is no absolute requirement for an IV (seiz has been arrested previously w/ IM ativan)  Extensive dental caries  per Dr. Kristin Bruins, patient needs outpatient oral surgeon - Dr. Florene Route as outpatient for possible dental extraction for dental caries after transfer to behavioral health - appointment with Seizure disorder / refractory epilepsy  As per Neurology Service - will need 72hrs seizure free prior to clearing for d/c home - some concern that he is having pseudoseizures- over the past few days pt has been reporting no further seizures (apparently has a strong aura) since 04/09/12. Patient in camera room. Phenobarbital added regimen per neurology. Continue current medications with vimpat, keppra, dilantin, topamax,phenobarbital. Neurology titrating medications. Taper Ativan per neurology recommendations. Per Neuro.patient will likely need outpatient follow up with his neurologist.  Major depressive disorder/SI  Patient denies any suicidal ideations. per Psychiatry who continues to follow the patient - Psych feels patient without any further suicidal ideations. Psych feels patient is competent. Psych feels patient may be discharged with outpatient followup possibly tomorrow if he remains medically stable. Patient does have a IVC and this would need to be discontinued on discharge. Sertraline dosing at the suggestion of Pscyh.  DVT RUE  Xarelto - patient has refused lab draws, therefore not a warfarin candidate  Difficult IV access  unable to establish peripheral IV despite multiple attempts 11/24 - per Dr. 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 Could not access a collateral vein in right neck. Attempt made to access left brachial vein above elbow. [Given DVT RUE] may have to use  femoral vein in future for access." - at present there is no absolute requirement for an IV (seiz has been arrested previously w/ IM ativan)  Extensive dental caries  per Dr. Kristin Bruins, patient needs outpatient oral surgeon - Dr. Florene Route as outpatient for possible dental extraction for dental caries after transfer to behavioral health - appointment with Dr. Florene Route can be arranged at [phone].  Alcohol abuse  Well beyond the window for withdrawal. Stable.  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 Patient to follow up with MD managing his pain medications as outpatient on discharge.  Code Status: FULL  Disposition Plan: probable D/C tomorrow home if no further seizures.  Consultants:  Neuro  Psych  Procedures:  none  Antibiotics:  none  DVT prophylaxis:  xarelto   Alcohol abuse  Well beyond the window for withdrawal. Stable.  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 Patient to follow up with MD managing his pain medications as outpatient on discharge.  Code Status: FULL  Disposition Plan: probable D/C tomorrow home if no further seizures.  Consultants:  Neuro  Psych  Procedures:  none  Antibiotics:  none  DVT prophylaxis:  xarelto   Discharge Condition: Stable  Disposition:  Follow-up Information    Follow up with Florene Route.   Contact information:   [address] [facility] [place] [address] [phone]       Call [facility]. (To schedule assessment for counseling and medication management)    Contact information:   Call [phone]      Call [facility] support groups. (If interested in joining a support group)    Contact information:   Call  [phone]         Diet: Regular Wt Readings from Last 3 Encounters:  04/14/12 70.1 kg (154 lb 8.7 oz)  03/23/12 69.854 kg (154 lb)  03/22/12 70.6 kg (155 lb 10.3 oz)    History of present illness:   31 year old man admitted for refractory epilepsy from [name] where he was treated for depression, SI. Hospitalization prolonged secondary to recurrent seizures despite 4 antiepileptics. IVC renewed 11/25 per Psychiatry for depression, SI.  Although stable after prolonged seizure 11/25 PM, access remained a problem, seizure severity seemed to be worsening overall, and patient continued to intermittently and unpredictably refuse care. Patient had not gone >24 hours without a seizure since admission up to 11/26. He remained under IVC. Until seizures are controlled it was thought prudent to transfer to [facility] where Neurology is based. .  11/20 4 recorded seizures  11/21 3 recorded seizures  11/22 1 recorded seizure  11/23 5 recorded seizures  11/24 2+ recorded seizures  11/25 seizure complex ~40 minutes requiring 8 mg IM Ativan  11/29 - 0630am - seizure  11/30 - 07:00am seizure x2  12/02 - 02:00 seizure  12/05 - 2 generalized seizures 2 mins each approx 220am  Consultants: Neurology psychiatry  Discharge Exam: Filed Vitals:   04/15/12 0553  BP: 99/79  Pulse: 64  Temp: 97.4 F (36.3 C)  Resp: 20   Filed Vitals:   04/14/12 0543 04/14/12 1550 04/14/12 2126 04/15/12 0553  BP: 105/51 119/51 102/57 99/79  Pulse: 55 57 59 64  Temp: 97.5  F (36.4 C) 97.9 F (36.6 C) 98 F (36.7 C) 97.4 F (36.3 C)  TempSrc: Oral Oral Oral Oral  Resp: 20 20 20 20   Height:      Weight: 70.1 kg (154 lb 8.7 oz)     SpO2: 100% 100% 100% 100%   General: A&O x 3, NAD, pleasant, cooperative Cardiovascular: RRR, no rub, no gallop, no S3 Respiratory: CTAB, no wheeze, no rhonchi Abdomen:soft, nontender, nondistended, positive bowel sounds Extremities: No edema, No lymphangitis, no petechiae  Discharge  Instructions  Discharge Orders    Future Orders Please Complete By Expires   Diet - low sodium heart healthy      Increase activity slowly      Discharge instructions      Comments:   Follow up with neurologist and psychiatrist in [place] in 2 weeks       Medication List     As of 04/15/2012 10:56 AM    TAKE these medications         Lacosamide 150 MG Tabs   Take 2 tablets (300 mg total) by mouth 2 (two) times daily.      levETIRAcetam 1000 MG tablet   Commonly known as: KEPPRA   Take 2 tablets (2,000 mg total) by mouth every 12 (twelve) hours.      LORazepam 0.5 MG tablet   Commonly known as: ATIVAN   Take 1 tablet (0.5 mg total) by mouth 2 (two) times daily.      morphine 30 MG tablet   Commonly known as: MSIR   Take 30 mg by mouth every 6 (six) hours as needed. Pain.  Patient reports taking prior to admission, with Rx from [facility] in [place]. Unable to verify. Last pain med at [facility] [phone]) was Oxycodone in 2011;   Patient states he has a supply at home, and will NOT need a prescription for this at discharge.      PHENobarbital 64.8 MG tablet   Commonly known as: LUMINAL   Take 1 tablet (64.8 mg total) by mouth at bedtime.      phenytoin 200 MG ER capsule   Commonly known as: DILANTIN   Take 1 capsule (200 mg total) by mouth 2 (two) times daily.      Rivaroxaban 20 MG Tabs   Commonly known as: XARELTO   Take 1 tablet (20 mg total) by mouth daily with supper.      Rivaroxaban 20 MG Tabs   Commonly known as: XARELTO   Take 20 mg by mouth daily with supper. Took 15 mg BID for 3 weeks (begun 11/19). To continue on 20 mg daily with supper.      sertraline 50 MG tablet   Commonly known as: ZOLOFT   Take 1 tablet (50 mg total) by mouth daily.      topiramate 100 MG tablet   Commonly known as: TOPAMAX   Take 1 tablet (100 mg total) by mouth 2 (two) times daily.         The results of significant diagnostics  from this hospitalization (including imaging, microbiology, ancillary and laboratory) are listed below for reference.    Significant Diagnostic Studies: Dg Mandible 4 Views  03/26/2012  *RADIOLOGY REPORT*  Clinical Data: Patient fell hitting right lower jaw  MANDIBLE - 4+ VIEW  Comparison: None.  Findings: By plain film, no mandibular fracture is seen.  The mandibular condyles appear to be in normal position.  There are lucencies within several teeth particularly  the  molars consistent with dental caries.  The sinuses are clear.  The zygomatic arches are intact.  IMPRESSION: No maxillofacial fracture is seen by plain film.  Probable dental caries.   Original Report Authenticated By: Dwyane Dee, M.D.      Microbiology: No results found for this or any previous visit (from the past 240 hour(s)).   Labs: Basic Metabolic Panel: No results found for this basename: NA:5,K:2,CL:5,CO2:5,GLUCOSE:5,BUN:5,CREATININE:5,CALCIUM:5,MG:5,PHOS:5 in the last 168 hours Liver Function Tests: No results found for this basename: AST:5,ALT:5,ALKPHOS:5,BILITOT:5,PROT:5,ALBUMIN:5 in the last 168 hours No results found for this basename: LIPASE:5,AMYLASE:5 in the last 168 hours No results found for this basename: AMMONIA:5 in the last 168 hours CBC: No results found for this basename: WBC:5,NEUTROABS:5,HGB:5,HCT:5,MCV:5,PLT:5 in the last 168 hours Cardiac Enzymes: No results found for this basename: CKTOTAL:5,CKMB:5,CKMBINDEX:5,TROPONINI:5 in the last 168 hours BNP: No components found with this basename: POCBNP:5 CBG: No results found for this basename: GLUCAP:5 in the last 168 hours  Time coordinating discharge:  Greater than 30 minutes  Signed:  Avrey Flanagin, DO [facility] Pager: [phone] 04/15/2012, 10:56 AM

## 2012-04-15 NOTE — Progress Notes (Signed)
Subjective: No recurrence of generalized seizure activity x5 days. Patient had no complaints including no side effects from antiseizure medications.  Objective: Current vital signs: BP 99/79  Pulse 64  Temp 97.4 F (36.3 C) (Oral)  Resp 20  Ht 5\' 8"  (1.727 m)  Wt 70.1 kg (154 lb 8.7 oz)  BMI 23.50 kg/m2  SpO2 100%  Neurologic Exam: Alert and in no acute distress. Mental status was unremarkable. Normal speech.   Medications:  Scheduled:   . docusate sodium  100 mg Oral BID  . folic acid  1 mg Oral Daily  . lacosamide  300 mg Oral BID  . levETIRAcetam  2,000 mg Oral Q12H  . LORazepam  0.5 mg Oral BID  . phenobarbital  64.8 mg Oral QHS  . phenytoin  200 mg Oral BID  . Rivaroxaban  20 mg Oral Q supper  . sertraline  50 mg Oral Daily  . thiamine  100 mg Oral Daily  . topiramate  100 mg Oral BID   GNF:[id number], acetaminophen, LORazepam, morphine, nicotine polacrilex, ondansetron (ZOFRAN) IV, ondansetron  Assessment/Plan: Seizure disorder with no recurrence of seizures x5 days. Patient is currently on a regimen of Vimpat, Keppra, phenobarbital, phenytoin and Topamax, and appears to be tolerating these medications well.  No changes in current management recommended. Consider transfer to behavioral health at this point.  C.R. Roseanne Reno, MD [facility] [phone]  04/15/2012  10:08 AM

## 2012-04-16 NOTE — Progress Notes (Signed)
Event:  RN notified that pt had 2 generalized seizures. The first lasting approx 2 min in duration the second approx 1 min. She reports pt did put his call light on just prior to seizure. Pt did not have urinary incontinence. Pt did not drop his 02 sats or have any indication of airway compromise. Pt was given 2mg  of IM Ativan and seizure activity has resolved. VSS. NP to bedside.  Subjective:  Pt reports he does remember getting a strong metal taste in his mouth just prior to the seizures. Currently reports "feeling" tired but denies other c/o's.  Objective:  Pt is a 31 year old man admitted on 03/20/12 for refractory epilepsy from [facility] where he was being treated for depression/SI. Hospitalization prolonged secondary to recurrent seizures despite 4 antiepileptics. Pt was transferred to [facility] on 03/31/12 where Neurology service is based. At bedside pt noted somewhat lethargic but responsive and appropriate. Speech is clear. BBS CTA, HR 80 w/ RRR and w/o M/G/R. Current VS T-98.3, BP 148/82, P-88, R-21 w/ 02 sats of 100% on R/A. Pupils 3/3 equal and reactive, EOMI, there is no obvious facial droop, CN I-XII appear grossly intact, MAEx4 w/ 5/5 strength BUE and 5/5 BLE's.  Assessment/Plan:  1. Seizure: Generalized. Resolved. Last recorded seizure 04/09/12. No focal neurological deficits identified. Last lab work noted on 03/30/12 revealed low but therapeutic dilantin level. Patient remains on Topamax, Dilantin, Keppra, Vimpat and phenobarbitol. Will defer any changes in pt's current regimen to rounding MD in am. [name] d/c for 04/15/2012  has been changed to 04/16/2012. Pt aware that he can not be d/c'd until rounding MD sees him this am and is agreeable. States he has a ride in route to pick him up. Continue PRN Ativan IM.  Will continue to monitor closely.    Leanne Chang, NP-C  [facility]  Pager [phone]

## 2012-04-16 NOTE — Progress Notes (Signed)
Subjective: Patient reportedly had 2 seizures earlier this morning. He is complaining today of feeling tired. Is also complaining of having a metallic taste in his mouth.  Objective: Current vital signs: BP 100/60  Pulse 90  Temp 98.5 F (36.9 C) (Oral)  Resp 18  Ht 5\' 8"  (1.727 m)  Wt 70.1 kg (154 lb 8.7 oz)  BMI 23.50 kg/m2  SpO2 100%  Neurologic Exam: Alert and in no acute distress. Mental status was unremarkable.  Medications:  Scheduled:   . docusate sodium  100 mg Oral BID  . folic acid  1 mg Oral Daily  . lacosamide  300 mg Oral BID  . levETIRAcetam  2,000 mg Oral Q12H  . LORazepam  0.5 mg Oral BID  . phenobarbital  64.8 mg Oral QHS  . phenytoin  200 mg Oral BID  . Rivaroxaban  20 mg Oral Q supper  . sertraline  50 mg Oral Daily  . thiamine  100 mg Oral Daily  . 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 There is little likelihood that he would continue to have seizures with a number of anticonvulsant medications he is taking.  I am recommending no changes in his current management including no changes in anticonvulsant medications. This patient has a recurrent seizure I am recommending arrangements be made for him to be transferred to a different facility where he can be monitored with continuous EEG and video monitoring to rule out pseudoseizures. Otherwise, I anticipate he should be discharged by tomorrow.  C.R. Roseanne Reno, MD [facility] [phone]  04/16/2012  4:40 PM

## 2012-04-16 NOTE — Progress Notes (Signed)
At [phone] RN responded to patients call light and discovered the patient having a tonic-clonic seizure. RN turned pt onto his side to prevent choking and protected pt during seizure. Seizure activity lasted for 2 minutes but immediately after the initial seizure, the patient had a second tonic-clonic seizure which lasted around 1 minute.  Ativan 2 mg was given IM and the Doctor was paged. Vitals signs are stable. Will continue to monitor.

## 2012-04-16 NOTE — Progress Notes (Signed)
[facility] PROGRESS NOTE  [name] [name] GEX:[id number] DOB: 06-06-1980 DOA: 03/26/2012 PCP: Sheila Oats, MD  Assessment/Plan: Seizure disorder / refractory epilepsy  -had apparent seizure x 2 last night -RN reported pt pushed his call light on just prior to seizure episode -some concern that he is having pseudoseizures- over the past few days pt has been reporting no further seizures (apparently has a strong aura) since 04/09/12. Patient in camera room.   -Continue current medications with vimpat, keppra, dilantin, topamax,phenobarbital. Neurology titrating medications. Taper Ativan per neurology recommendations. Per Neuro.patient will likely need outpatient follow up with his neurologist.  -reconsulted neurology today--spoke with Dr. Noel Christmas Major depressive disorder/SI  Patient denies any suicidal ideations. per Psychiatry who continues to follow the patient - Psych feels patient without any further suicidal ideations. Psych feels patient is competent. Psych feels patient may be discharged with outpatient followup possibly tomorrow if he remains medically stable. Patient does have a IVC and this would need to be discontinued on discharge. Sertraline dosing at the suggestion of Pscyh.  DVT RUE  Xarelto - patient has refused lab draws, therefore not a warfarin candidate  Difficult IV access  unable to establish peripheral IV despite multiple attempts 11/24 - per Dr. 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 Could not access a collateral vein in right neck. Attempt made to access left brachial vein above elbow. [Given DVT RUE] may have to use femoral vein in future for access." - at present there is no absolute requirement for an IV (seiz has been arrested previously w/ IM ativan)  Extensive dental caries  per Dr. Kristin Bruins, patient needs outpatient oral surgeon - Dr. Florene Route as outpatient for possible dental extraction for dental caries after  transfer to behavioral health - appointment with with Dr. Florene Route can be arranged at [phone].  Alcohol abuse  Well beyond the window for withdrawal. Stable.  Protein C deficiency  On chronic anticoag-lab may be falsely low due to acute DVT.  Will need recheck after finished with anticoag therapy regimen Report of drug seeking behavior  patient has reported various outpatient regimens - Continue morphine sulfate 30mg  every 8 hours as needed. Patient to follow up with MD managing his pain medications as outpatient on discharge.  Code Status: FULL  Disposition Plan: probable D/C home if no further workup by neurology Consultants:  Neuro  Psych  Procedures:  none  Antibiotics:  none  DVT prophylaxis:  xarelto  Alcohol abuse  Well beyond the window for withdrawal. Stable.  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 Patient to follow up with MD managing his pain medications as outpatient on discharge.  Code Status: FULL  Disposition Plan: probable D/C tomorrow home if no further seizures.  Consultants:  Neuro  Psych  Procedures:  none  Antibiotics:  none  DVT prophylaxis:  xarelto      Family Communication:   Pt at beside Disposition Plan:   Home when medically stable      Procedures/Studies: Dg Mandible 4 Views  03/26/2012  *RADIOLOGY REPORT*  Clinical Data: Patient fell hitting right lower jaw  MANDIBLE - 4+ VIEW  Comparison: None.  Findings: By plain film, no mandibular fracture is seen.  The mandibular condyles appear to be in normal position.  There are lucencies within several teeth particularly the  molars consistent with dental caries.  The  sinuses are clear.  The zygomatic arches are intact.  IMPRESSION: No maxillofacial fracture is seen by plain film.  Probable dental caries.   Original Report Authenticated By: Dwyane Dee, M.D.          Subjective: Patient states that he feels sleepy. Complains of a bifrontal headache. No visual changes. No focal extremity weakness. No dysarthria. Denies any chest pain, shortness breath, nausea, vomiting, diarrhea, abdominal pain, dysuria.  Objective: Filed Vitals:   04/15/12 0553 04/15/12 1443 04/15/12 2146 04/16/12 0420  BP: 99/79 119/71 124/85 148/82  Pulse: 64 74 66 88  Temp: 97.4 F (36.3 C) 98.2 F (36.8 C) 98.3 F (36.8 C) 98.3 F (36.8 C)  TempSrc: Oral Oral Oral Axillary  Resp: 20 20 18    Height:      Weight:      SpO2: 100% 100% 100% 100%   No intake or output data in the 24 hours ending 04/16/12 1048 Weight change:  Exam:   General:  Pt is alert, follows commands appropriately, not in acute distress  HEENT: No icterus, No thrush,[place]/AT  Cardiovascular: RRR, S1/S2, no rubs, no gallops  Respiratory: CTA bilaterally, no wheezing, no crackles, no rhonchi  Abdomen: Soft/+BS, non tender, non distended, no guarding  Extremities: 1+ edema, No lymphangitis, No petechiae, No rashes, no synovitis  Neurologic: PERRL, EOMI, no facial asymmetry, bilateral facial sensation intact, posterior pharynx rises symmetrically pain, no tongue deviation, corneal reflex intact, patient blinks to visual menace; strength 4/5 in the bilateral upper and lower extremities. Sensation intact to protopathic and epicritic stimuli bilaterally.  Data Reviewed: Basic Metabolic Panel: No results found for this basename: NA:5,K:5,CL:5,CO2:5,GLUCOSE:5,BUN:5,CREATININE:5,CALCIUM:5,MG:5,PHOS:5 in the last 168 hours Liver Function Tests: No results found for this basename: AST:5,ALT:5,ALKPHOS:5,BILITOT:5,PROT:5,ALBUMIN:5 in the last 168 hours No results found for this basename: LIPASE:5,AMYLASE:5 in the last 168 hours No results found for this basename: AMMONIA:5 in the last 168 hours CBC: No results found for this basename: WBC:5,NEUTROABS:5,HGB:5,HCT:5,MCV:5,PLT:5 in the  last 168 hours Cardiac Enzymes: No results found for this basename: CKTOTAL:5,CKMB:5,CKMBINDEX:5,TROPONINI:5 in the last 168 hours BNP: No components found with this basename: POCBNP:5 CBG: No results found for this basename: GLUCAP:5 in the last 168 hours  No results found for this or any previous visit (from the past 240 hour(s)).   Scheduled Meds:   . docusate sodium  100 mg Oral BID  . folic acid  1 mg Oral Daily  . lacosamide  300 mg Oral BID  . levETIRAcetam  2,000 mg Oral Q12H  . LORazepam  0.5 mg Oral BID  . phenobarbital  64.8 mg Oral QHS  . phenytoin  200 mg Oral BID  . Rivaroxaban  20 mg Oral Q supper  . sertraline  50 mg Oral Daily  . thiamine  100 mg Oral Daily  . topiramate  100 mg Oral BID   Continuous Infusions:    Phylicia Mcgaugh, DO  [facility] Pager [phone]  If 7PM-7AM, please contact night-coverage [email or web address] Password [email or web address] 04/16/2012, 10:48 AM   LOS: 21 days

## 2012-04-16 NOTE — Progress Notes (Signed)
Nutrition Brief Note  Spoke with pt who reports that his appetite is good and he has had no recent weight changes.   Body mass index is 23.50 kg/(m^2). BMI WNL.  Current diet order is Regular, patient is consuming approximately 100% of meals at this time. Labs and medications reviewed.   No nutrition interventions warranted at this time. If nutrition issues arise, please consult RD.   Kendell Bane RD, LDN, CNSC [phone] Pager [phone] After Hours Pager

## 2012-04-16 NOTE — Progress Notes (Signed)
Clinical Social Work Progress Note PSYCHIATRY SERVICE LINE 04/16/2012  Patient:  [name] [name]  Account:  [id number]  Admit Date:  03/26/2012  Clinical Social Worker:  Unk Lightning, LCSW  Date/Time:  04/16/2012 03:45 PM  Review of Patient  Overall Medical Condition:   "I'm ready to get out of here. I'm pissed off."   Participation Level:  Minimal  Participation Quality  Resistant   Other Participation Quality:   Affect  Angry   Cognitive  Alert   Reaction to Medications/Concerns:   None reported   Modes of Intervention  Confrontation  Support   Summary of Progress/Plan at Discharge   CSW staffed case with psych MD who reports that stepfather did not show up to pick up patient and patient had another seizure. Psych MD requests phone number for step dad.    CSW met with patient at bedside. Patient was in bed with all the lights off. Patient turned the lights on and reported he was "pissed off and had been disrespected." Patient reported that MD was rude and it made him upset. Patient reports if MD is rude to him then he will not place his hands on him but will tell him that he is being rude. Patient reports that MD was questioning patient about his ride home. Patient reports that step dad was on his way when he had a seizure. CSW inquired about step dad and his ability to pick up patient at dc. Patient reports he is being dc tomorrow and step dad is aware. CSW asked for step dad's number but patient reports he does not have his number. Step dad's phone number is in his cell phone which he does not have. Patient reports that he waits for step dad to call him. Patient reports when step dad calls he will write down number. CSW explained the importance of confirming a ride and patient reports if step dad does not come then he will walk out of the hospital.    CSW explained that patient is under IVC which means he cannot leave until MD dc him. CSW explained it would not be  appropriate for patient to leave without a set plan. CSW offered to assist patient with transportation such as a train or bus pass. Patient refused help and reports he will only return home with step dad. CSW asked RN to get step dad's number and place on chart when he calls patient tonight.    Patient denies any SI or HI. Patient reports he is just angry and needs to calm down. CSW will continue to follow.

## 2012-04-17 DIAGNOSIS — F17200 Nicotine dependence, unspecified, uncomplicated: Secondary | ICD-10-CM

## 2012-04-17 NOTE — Progress Notes (Signed)
Clinical Social Work  CSW spoke with [employer] office who reported if patient was to dc before IVC expired then MD needed to sign Notice of Commitment Change form. MD signed form and CSW faxed copy to Magistrate. Original form placed on patient's chart.  CSW met with patient at bedside. CSW once again attempted to talk with patient regarding transportation and follow up for medication through [facility] program at pharmacy. Patient reports that step dad will be at hospital around 5:30 or 6pm to pick him up. Patient reports he still does not have step dad's phone number. CSW offered a bus pass to assist with transportation back to [place] but patient refused. CSW explained that patient is no longer under IVC and is dc from the hospital. CSW explained that patient would need to arrange for transportation to occur today. CSW encouraged patient to have back up plan in case step dad is running late or cannot come. Patient continues to refuse all help.  Psych CSW is signing off but informed unit CSW that patient might have transportation concerns. CSW updated bedside RN that patient is no longer under IVC.  [place], [place] [phone]

## 2012-04-17 NOTE — Progress Notes (Signed)
Clinical Social Work  CSW received a call from RN reporting that patient is requesting to see CSW again. CSW met with patient at room. Patient dressed and sitting in chair. Patient began yelling at CSW stating that he would not get in a taxi and his step dad was on his way. CSW deescalated patient and asked patient to speak with CSW regarding concerns. Patient admitted that dtr's birthday was yesterday and he has been upset for the past few days. CSW validated these feelings and observed that patient has been more irritable the past few days. CSW and patient discussed managing his anger and recognizing that his depression was causing him to be more angry with hospital staff. Patient reflected on this and reported that MD apologized and they were "on good terms." CSW spoke with patient again regarding his right to leave. CSW asked patient to consider if leaving without his step dad present would be the best situation. CSW asked patient how he would get his medication, a ride home or even call step dad if he left. Patient acknowledged this and agreed to wait in room for step dad. Patient continues to report step dad is on his way. Patient continues to refuse transportation assistance. Patient became irritable again before session was over and asked CSW to leave.   CSW updated RN on plans. CSW signing off but available if needed.   [place], [place] [phone]

## 2012-04-17 NOTE — Progress Notes (Addendum)
Clinical Social Work Progress Note PSYCHIATRY SERVICE LINE 04/17/2012  Patient:  [name] [name]  Account:  [id number]  Admit Date:  03/26/2012  Clinical Social Worker:  Unk Lightning, LCSW  Date/Time:  04/17/2012 08:45 AM  Review of Patient  Overall Medical Condition:   "I haven't slept well but I didn't have any seizures"   Participation Level:  Minimal  Participation Quality  Guarded   Other Participation Quality:   Affect  Angry   Cognitive  Alert   Reaction to Medications/Concerns:   None reported   Modes of Intervention  Confrontation   Summary of Progress/Plan at Discharge   CSW met with patient at bedside. All lights off and patient laying in bed. CSW spoke with patient regarding his night. Patient reports he is trying to rest because he did not sleep well last night. CSW asked if patient spoke with step dad. Patient reports step dad called last night and still agreeable to transport patient home. CSW inquired if patient wrote down step dad's number or scheduled a time for him to arrive. Patient became defensive and stated "don't treat me like that doctor did" and then refused to speak with CSW. CSW explained that patient is under IVC and it will need to be reversed prior to discharge. CSW explained that a concrete plan would need to be in place in order or it could delay dc. Patient continues to ignore CSW and refuses to participate. CSW will continue to follow.

## 2012-04-17 NOTE — Progress Notes (Signed)
Patient Identification:  [name] [name] Date of Evaluation:  04/17/2012 Reason for Consult:  Uncontrolled Congenial Seizures  Referring Provider: Dr. Arbutus [name]  History of Present Illness:Pt is admitted for uncontrolled seizures after transfer to [facility] when medication regimen was inadvertently interrupted.  He has been having serial seizures with medication adjustments.  He was at the point of no seizure and stepfather was to pick him up last night and he had another seizure  Past Psychiatric History:  Polysubstance abuse, non-adherence to medical regimen, spontaneous objections to tests, procedures.   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  AXIS IV economic problems, housing problems, occupational problems, other psychosocial or environmental problems, problems related to social environment and unable to drive, limited fiend contacts; inconsistent healthcare/seizures.  AXIS V 51-60 moderate symptoms   Assessment/Plan:  Discussed with [name] [name], Psych CSW  IVC on file Pt is in a darkened room.  He says he is very angry over discussion with MD. He says his father has a lot to do after he leaves work, returns the company car and then drive to [place].  He was on his way when pt called to cancel his discharge due to a seizure.  [MD says he put on the call button and then had a seizure.] [name] [name]  observation suggests the last seizure may have been a pseudoseizure.  (Hypothetically, he may have 'had the seizure' IF his father in fact was not coming NB no one has the father's number.  He may have wanted to save face were his father changing his plans.  The seizure would save face in the case the father was not going to show up.]  Anger may be displaced to obscure anger at father.  Discussed with Psych CSW RECOMMENDATION:  1.  Offered supportive suggestions, 2.  Will follow pt. [name] Skinner MD 04/17/2012 12:57 AM

## 2012-04-17 NOTE — Progress Notes (Addendum)
NEURO HOSPITALIST PROGRESS NOTE   SUBJECTIVE:                                                                                                                        " I feel fine, I have not had a seizure and am just sleepy". "hopefully I can go home soon".   OBJECTIVE:                                                                                                                           Vital signs in last 24 hours: Temp:  [97.8 F (36.6 C)-98.5 F (36.9 C)] 97.8 F (36.6 C) (12/13 0645) Pulse Rate:  [69-90] 69  (12/13 0645) Resp:  [16-21] 16  (12/13 0645) BP: (100-112)/(60-64) 112/64 mmHg (12/13 0645) SpO2:  [96 %-100 %] 96 % (12/13 0645)  Intake/Output from previous day:   Intake/Output this shift: Total I/O In: 240 [P.O.:240] Out: -  Nutritional status: General  Past Medical History  Diagnosis Date  . Seizure   . Protein C deficiency   . Protein C deficiency     Neurologic Exam:  Mental Status: Alert, oriented, thought content appropriate.  Speech fluent without evidence of aphasia.  Able to follow 3 step commands without difficulty. Cranial Nerves: II: Visual fields grossly normal, pupils equal, round, reactive to light and accommodation III,IV, VI: ptosis not present, extra-ocular motions intact bilaterally V,VII: smile symmetric, facial light touch sensation normal bilaterally VIII: hearing normal bilaterally IX,X: gag reflex present XI: bilateral shoulder shrug XII: midline tongue extension Motor: Right : Upper extremity   5/5    Left:     Upper extremity   5/5  Lower extremity   5/5     Lower extremity   5/5 Tone and bulk:normal tone throughout; no atrophy noted Sensory: Pinprick and light touch intact throughout, bilaterally Deep Tendon Reflexes: 2+ and symmetric throughout Plantars: Right: downgoing   Left: downgoing Cerebellar: normal finger-to-nose,  normal heel-to-shin test CV: pulses palpable throughout      Lab Results: No results found for this basename: cbc, bmp, coags, chol, tri, ldl, hga1c   Lipid Panel No results found for this basename: CHOL,TRIG,HDL,CHOLHDL,VLDL,LDLCALC in the last 72 hours  Studies/Results: No results found.  MEDICATIONS                                                                                                                        Scheduled:   . docusate sodium  100 mg Oral BID  . folic acid  1 mg Oral Daily  . lacosamide  300 mg Oral BID  . levETIRAcetam  2,000 mg Oral Q12H  . LORazepam  0.5 mg Oral BID  . phenobarbital  64.8 mg Oral QHS  . phenytoin  200 mg Oral BID  . Rivaroxaban  20 mg Oral Q supper  . sertraline  50 mg Oral Daily  . thiamine  100 mg Oral Daily  . topiramate  100 mg Oral BID    ASSESSMENT/PLAN:                                                                                                               Patient Active Hospital Problem List: Seizure disorder (03/07/2012)   Assessment: No further seizures.     Plan:  1) No change in patients AED regime.   2) Per previous note, if patient has continued seizure activity would recommend having patient transferred to a facility with continuous monitoring to R/O pseudoseizure. Otherwise, discharge home on current AED regime and follow up with out-patient neurology.     Felicie Morn PA-C [facility] [phone]  04/17/2012, 9:41 AM

## 2012-04-17 NOTE — Discharge Summary (Signed)
Physician Discharge Summary  Yoshiaki Xxx-Wale UJW:[id number] DOB: 23-Dec-1980 DOA: 03/26/2012  PCP: Sheila Oats, MD  Admit date: 03/26/2012 Discharge date: 04/17/2012  Recommendations for Outpatient Follow-up:  1. Pt will need to follow up with PCP in 2 weeks post discharge 2. Followup with neurologist and psychiatrist in [facility] in 2 weeks 3. Follow up with dentist--Dr. Florene Route can be arranged at [phone].   Discharge Diagnoses:  Principal Problem:  *Seizure disorder Active Problems:  Status epilepticus, generalized convulsive  EtOH dependence  Pain  DVT (deep venous thrombosis)  Major depression Seizure disorder / refractory epilepsy  -some concern that he is having pseudoseizures- over the past few days pt has been reporting no further seizures (apparently has a strong aura) since 04/09/12. -pt had 2 seizures type events on night of 04/15/12 when his step dad did not arrive to pick patient up.  It was noted that the patient pushed nurse call button immediately before have the event -neurology was reconsulted -Neurology felt patient may benefit from a transfer to a tertiary care center for EMU monitoring as they felt patient may have a component of pseudoseizure -This was discussed with the patient and the patient adamantly refused any type of transfer to any other facility for further evaluation -This risks, benefits, and alternatives were discussed with the patient and he expressed understanding and continued to refuse transfer to tertiary care center -Pt. Did not have any further seizure like activity for >36 hrs and given the patient's refusal to go to a tertiary care center, the patient will be discharged home with followup with his neurologist in [place], [place] -Arrangements were made for the patient to obtain 30 days of his antiepiletic medications here in [place] prior to his leaving for [place] -Continue current medications with vimpat, keppra,  dilantin, topamax,phenobarbital. Neurology titrating medications. Taper Ativan per neurology recommendations. Per Neuro.patient will likely need outpatient follow up with his neurologist.  Major depressive disorder/SI  Patient denies any suicidal ideations. per Psychiatry who continues to follow the patient - Psych feels patient without any further suicidal ideations. Psych feels patient is competent. Psych feels patient may be discharged with outpatient followup. Sertraline dosing at the suggestion of Pscyh.  DVT RUE  Xarelto - patient has refused lab draws, therefore not a warfarin candidate  -patient continues to refuse all lab draws since 03/31/12 -continue xarelto indefinitely due to hx of protein C deficiency -pt will need follow up with his primary care physician -pt was on xarelto for 3-4 weeks prior to this admission Difficult IV access  -unable to establish peripheral IV despite multiple attempts 11/24 - per Dr. 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 Could not access a collateral vein in right neck. Attempt made to access left brachial vein above elbow. [Given DVT RUE] may have to use femoral vein in future for access." - at present there is no absolute requirement for an IV (seiz has been arrested previously w/ IM ativan)  Extensive dental caries  per Dr. Kristin Bruins, patient needs outpatient oral surgeon - Dr. Florene Route as outpatient for possible dental extraction for dental caries after transfer to behavioral health - appointment with with Dr. Florene Route can be arranged at [phone].  Alcohol abuse  Well beyond the window for withdrawal. Stable.  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 Patient to follow up with MD  managing his pain medications as outpatient on discharge.  Code Status: FULL   Consultants:   Neuro  Psych  Procedures:  none  Antibiotics:  none  DVT prophylaxis:  xarelto      Discharge Condition: Stable  Disposition:  Follow-up Information    Follow up with Florene Route.   Contact information:   [address], [address] [facility] [place] [address] [phone]       Call [facility]. (To schedule assessment for counseling and medication management)    Contact information:   Call [phone]      Call [facility] support groups. (If interested in joining a support group)    Contact information:   Call [phone]         Diet: Regular Wt Readings from Last 3 Encounters:  04/14/12 70.1 kg (154 lb 8.7 oz)  03/23/12 69.854 kg (154 lb)  03/22/12 70.6 kg (155 lb 10.3 oz)    History of present illness:   31 year old man admitted for refractory epilepsy from [facility] where he was treated for depression, SI. Hospitalization prolonged secondary to recurrent seizures despite 4 antiepileptics. IVC (involuntary commitment) renewed 11/25 per Psychiatry for depression, SI.  Although stable after prolonged seizure 11/25 PM, access remained a problem, seizure severity seemed to be worsening overall, and patient continued to intermittently and unpredictably refuse care. Patient had not gone >24 hours without a seizure since admission up to 11/26. He remained under IVC. Until seizures are controlled it was thought prudent to transfer to [facility] where Neurology is based. .  11/20 4 recorded seizures  11/21 3 recorded seizures  11/22 1 recorded seizure  11/23 5 recorded seizures  11/24 2+ recorded seizures  11/25 seizure complex ~40 minutes requiring 8 mg IM Ativan  11/29 - 0630am - seizure  11/30 - 07:00am seizure x2  12/02 - 02:00 seizure  12/05 - 2 generalized seizures 2 mins each approx 220am 12/11-2 episodes of seizure like activity  Consultants: Neurology psychiatry  Discharge Exam: Filed Vitals:   04/17/12 0645  BP: 112/64  Pulse: 69  Temp: 97.8 F  (36.6 C)  Resp: 16   Filed Vitals:   04/16/12 0420 04/16/12 1400 04/16/12 2200 04/17/12 0645  BP: 148/82 100/60 109/61 112/64  Pulse: 88 90 71 69  Temp: 98.3 F (36.8 C) 98.5 F (36.9 C) 98.2 F (36.8 C) 97.8 F (36.6 C)  TempSrc: Axillary Oral Oral Oral  Resp:  18 21 16   Height:      Weight:      SpO2: 100% 100% 98% 96%   General: A&O x 3, NAD, pleasant, cooperative Cardiovascular: RRR, no rub, no gallop, no S3 Respiratory: CTAB, no wheeze, no rhonchi Abdomen:soft, nontender, nondistended, positive bowel sounds Extremities: No edema, No lymphangitis, no petechiae  Discharge Instructions      Discharge Orders    Future Orders Please Complete By Expires   Diet - low sodium heart healthy      Increase activity slowly      Discharge instructions      Comments:   Follow up with neurologist and psychiatrist in [place] in 2 weeks       Medication List     As of 04/17/2012 11:53 AM    TAKE these medications         Lacosamide 150 MG Tabs   Take 2 tablets (300 mg total) by mouth 2 (two) times daily.      levETIRAcetam 1000 MG tablet   Commonly known  as: KEPPRA   Take 2 tablets (2,000 mg total) by mouth every 12 (twelve) hours.      LORazepam 0.5 MG tablet   Commonly known as: ATIVAN   Take 1 tablet (0.5 mg total) by mouth 2 (two) times daily.      morphine 30 MG tablet   Commonly known as: MSIR   Take 30 mg by mouth every 6 (six) hours as needed. Pain.  Patient reports taking prior to admission, with Rx from [facility] in [place]. Unable to verify. Last pain med at [facility] [phone]) was Oxycodone in 2011;   Patient states he has a supply at home, and will NOT need a prescription for this at discharge.      PHENobarbital 64.8 MG tablet   Commonly known as: LUMINAL   Take 1 tablet (64.8 mg total) by mouth at bedtime.      phenytoin 200 MG ER capsule   Commonly known as: DILANTIN   Take 1 capsule (200 mg total) by  mouth 2 (two) times daily.      Rivaroxaban 20 MG Tabs   Commonly known as: XARELTO   Take 1 tablet (20 mg total) by mouth daily with supper.      Rivaroxaban 20 MG Tabs   Commonly known as: XARELTO   Take 20 mg by mouth daily with supper. Took 15 mg BID for 3 weeks (begun 11/19). To continue on 20 mg daily with supper.      sertraline 50 MG tablet   Commonly known as: ZOLOFT   Take 1 tablet (50 mg total) by mouth daily.      topiramate 100 MG tablet   Commonly known as: TOPAMAX   Take 1 tablet (100 mg total) by mouth 2 (two) times daily.           The results of significant diagnostics from this hospitalization (including imaging, microbiology, ancillary and laboratory) are listed below for reference.    Significant Diagnostic Studies: Dg Mandible 4 Views  03/26/2012  *RADIOLOGY REPORT*  Clinical Data: Patient fell hitting right lower jaw  MANDIBLE - 4+ VIEW  Comparison: None.  Findings: By plain film, no mandibular fracture is seen.  The mandibular condyles appear to be in normal position.  There are lucencies within several teeth particularly the  molars consistent with dental caries.  The sinuses are clear.  The zygomatic arches are intact.  IMPRESSION: No maxillofacial fracture is seen by plain film.  Probable dental caries.   Original Report Authenticated By: Dwyane Dee, M.D.      Microbiology: No results found for this or any previous visit (from the past 240 hour(s)).   Labs: Basic Metabolic Panel: No results found for this basename: NA:5,K:2,CL:5,CO2:5,GLUCOSE:5,BUN:5,CREATININE:5,CALCIUM:5,MG:5,PHOS:5 in the last 168 hours Liver Function Tests: No results found for this basename: AST:5,ALT:5,ALKPHOS:5,BILITOT:5,PROT:5,ALBUMIN:5 in the last 168 hours No results found for this basename: LIPASE:5,AMYLASE:5 in the last 168 hours No results found for this basename: AMMONIA:5 in the last 168 hours CBC: No results found for this basename:  WBC:5,NEUTROABS:5,HGB:5,HCT:5,MCV:5,PLT:5 in the last 168 hours Cardiac Enzymes: No results found for this basename: CKTOTAL:5,CKMB:5,CKMBINDEX:5,TROPONINI:5 in the last 168 hours BNP: No components found with this basename: POCBNP:5 CBG: No results found for this basename: GLUCAP:5 in the last 168 hours  Time coordinating discharge:  Greater than 30 minutes  Signed:  Josimar Corning, DO [facility] Pager: [phone] 04/17/2012, 11:53 AM

## 2012-04-18 NOTE — Consult Note (Signed)
Patient Identification:  [name] [name] Date of Evaluation:  04/18/2012 Reason for Consult:  SI  Referring Provider: Dr. Rosalita Levan  HPI:   Was transferred from [facility] for seizure. Was doing better in terms his mood and depression until his planned discharge this week. Reports being more depressed and also reports SI at times since yesterday. No plans to kill himself at this time. Thinks he just feels down and not feeling ready to leave. Reports seizure yesterday but per chart primary team plans not to do more work up and med changes for that. He refused tranfers for his seizure work outside [facility] now.  Pt showed interred to go [facility] now  For his depressed mood and SI thoughts now. denies any avh or manic symptoms.       Past Psychiatric History: Congenital seizure disorder, Medication seeking [asks for stimulant for ADHD], receives pain medication, drinks beer- 2-3 beers; denies tobacco, recreational drugs.   Past Medical History:     Past Medical History  Diagnosis Date  . Seizure   . Protein C deficiency   . Protein C deficiency        Past Surgical History  Procedure Date  . Insertion of vena cava filter     Allergies:  Allergies  Allergen Reactions  . Fish Allergy Anaphylaxis and Rash  . Depakote (Divalproex Sodium) Swelling  . Morphine And Related Hives    Pt tolerated IV morphine 03/25/2012 admission. Pt tolerated PO morphine 03/29/2012.    Current Medications:  Prior to Admission medications   Medication Sig Start Date End Date Taking? Authorizing Provider  lacosamide 100 MG TABS Take 1 tablet (100 mg total) by mouth 2 (two) times daily. 03/16/12   Meredeth Ide, MD  levETIRAcetam (KEPPRA) 750 MG tablet Take 2 tablets (1,500 mg total) by mouth 2 (two) times daily. 03/16/12   Meredeth Ide, MD  phenytoin (DILANTIN) 100 MG ER capsule Take 2 capsules (200 mg total) by mouth 2 (two) times daily. 03/16/12   Meredeth Ide, MD  Rivaroxaban (XARELTO) 15 MG TABS tablet Take 1 tablet  (15 mg total) by mouth 2 (two) times daily. Take 15 mg po daily for 20 days then  Continue to take 20 mg po daily 03/16/12   Meredeth Ide, MD  sulfamethoxazole-trimethoprim (BACTRIM DS) 800-160 MG per tablet Take 1 tablet by mouth every 12 (twelve) hours. 03/16/12   Meredeth Ide, MD  thiamine 100 MG tablet Take 1 tablet (100 mg total) by mouth daily. 03/16/12   Meredeth Ide, MD  traMADol (ULTRAM) 50 MG tablet Take 1 tablet (50 mg total) by mouth every 6 (six) hours as needed. 03/16/12   Meredeth Ide, MD    Social History:    reports that he has been smoking Cigarettes.  He has a 1.25 pack-year smoking history. He has never used smokeless tobacco. He reports that he drinks about 3.6 ounces of alcohol per week. He reports that he uses illicit drugs (Marijuana).   Family History:    History reviewed. No pertinent family history.     Mental Status Examination/Evaluation:  Appearance: on bed  Eye Contact:: Good  Speech: slow  Volume: Normal  Mood: down  Affect: ristricted  Thought Process: organized  Orientation: Full  Thought Content: NO AVH  Suicidal Thoughts: yes on and off but now specific plans  Homicidal Thoughts: no  Memory: fair  Judgement: Impaired  Insight: Lacking  Psychomotor Activity: Normal  Concentration: Fair  Recall: Fair  Akathisia: No  DIAGNOSIS:   I.  Depressive d.o nos, r/o substance induced mood d/do, Congenital Seizure Disorder; Pain medication addiction, Drug-seeking behavior History of non-adherence to medical treatments, diagnostics and management recommendations.    RECOMMENDATION:    1.  Will recommend to increase zoloft to 75 mg qd for depressive symptoms  2. Will recommend transfer to [facility] once medical cleared for safety and for his current depressive symtpoms. [facility] will not have any IV access   3. Will continue to follow  Wonda Cerise MD 04/18/2012 5:09 PM      Review of Systems  HENT: Negative.      Physical  Exam

## 2012-04-18 NOTE — Progress Notes (Signed)
[facility] PROGRESS NOTE  [name] [name] ZOX:[id number] DOB: [birth year] DOA: 03/26/2012 PCP: Sheila Oats, MD  Assessment/Plan: Seizure disorder / refractory epilepsy  -some concern that he is having pseudoseizures- over the past few days pt has been reporting no further seizures (apparently has a strong aura) since 04/09/12.  -pt had 2 seizures type events on night of 04/15/12 when his step dad did not arrive to pick patient up. It was noted that the patient pushed nurse call button immediately before have the event  -neurology was reconsulted  -Neurology felt patient may benefit from a transfer to a tertiary care center for EMU monitoring as they felt patient may have a component of pseudoseizure  -This was discussed with the patient and the patient adamantly refused any type of transfer to any other facility for further evaluation  -This risks, benefits, and alternatives were discussed with the patient and he expressed understanding and continued to refuse transfer to tertiary care center  -Pt. Did not have any further seizure like activity for >36 hrs and given the patient's refusal to go to a tertiary care center, the patient will be discharged home with followup with his neurologist in [place], [place]  -Arrangements were made for the patient to obtain 30 days of his antiepiletic medications here in [place] prior to his leaving for [place]  -Continue current medications with vimpat, keppra, dilantin, topamax,phenobarbital. Neurology titrating medications. Taper Ativan per neurology recommendations. -outpatient follow up with his neurologist.  Major depressive disorder/SI  -04/17/12--pt c/o SI, sitter in room -reconsult psychiatry -hold d/c for now -Psych feels patient is competent. Sertraline dosing at the suggestion of Pscyh.  DVT RUE  Xarelto - patient has refused lab draws, therefore not a warfarin candidate  -patient continues to refuse all lab draws since 03/31/12   -continue xarelto indefinitely due to hx of protein C deficiency  -pt will need follow up with his primary care physician  -pt was on xarelto for 3-4 weeks prior to this admission  Difficult IV access  -unable to establish peripheral IV despite multiple attempts 11/24 - per Dr. 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 Could not access a collateral vein in right neck. Attempt made to access left brachial vein above elbow. [Given DVT RUE] may have to use femoral vein in future for access." - at present there is no absolute requirement for an IV (seiz has been arrested previously w/ IM ativan)  -pt continues to refuse lab draws Extensive dental caries  per Dr. Kristin Bruins, patient needs outpatient oral surgeon - Dr. Florene Route as outpatient for possible dental extraction for dental caries after transfer to behavioral health - appointment with with Dr. Florene Route can be arranged at [phone].  Alcohol abuse  Well beyond the window for withdrawal. Stable.  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 Patient to follow up with MD managing his pain medications as outpatient on discharge.  Code Status: FULL  Consultants:  Neuro  Psych  Procedures:  none  Antibiotics:  none  DVT prophylaxis:  xarelto      Family Communication:   Sitter at bedside Disposition Plan:   Home when medically stable      Procedures/Studies: Dg Mandible 4 Views  03/26/2012  *RADIOLOGY REPORT*  Clinical Data: Patient fell hitting right lower jaw  MANDIBLE - 4+ VIEW  Comparison: None.  Findings: By plain  film, no mandibular fracture is seen.  The mandibular condyles appear to be in normal position.  There are lucencies within several teeth particularly the  molars consistent with dental caries.  The sinuses are clear.  The zygomatic arches are  intact.  IMPRESSION: No maxillofacial fracture is seen by plain film.  Probable dental caries.   Original Report Authenticated By: Dwyane Dee, M.D.          Subjective: Patient continues to get agitated when asked about his step father coming to get him and further questions about arranging transportation for the patient. He continually refuses transportation assistance. This morning, he continues to voice suicidal ideation. He states, "I'm not feeling good, I have a lot going on in my life right now." Denies fevers, chills, chest pain, shortness breath, vomiting, diarrhea, abdominal pain, dysuria.  Objective: Filed Vitals:   04/16/12 2200 04/17/12 0645 04/17/12 1424 04/17/12 2142  BP: 109/61 112/64 120/80 135/81  Pulse: 71 69 70 69  Temp: 98.2 F (36.8 C) 97.8 F (36.6 C) 97.6 F (36.4 C) 98 F (36.7 C)  TempSrc: Oral Oral Oral Oral  Resp: 21 16 18 16   Height:      Weight:      SpO2: 98% 96% 98% 99%   No intake or output data in the 24 hours ending 04/18/12 0917 Weight change:  Exam:   General:  Pt is alert, follows commands appropriately, not in acute distress  HEENT: No icterus, No thrush,  [place]/AT  Cardiovascular: RRR, S1/S2, no rubs, no gallops  Respiratory: CTA bilaterally, no wheezing, no crackles, no rhonchi  Abdomen: Soft/+BS, non tender, non distended, no guarding  Extremities: trace edema,  No petechiae, No rashes, no synovitis  Data Reviewed: Basic Metabolic Panel: No results found for this basename: NA:5,K:5,CL:5,CO2:5,GLUCOSE:5,BUN:5,CREATININE:5,CALCIUM:5,MG:5,PHOS:5 in the last 168 hours Liver Function Tests: No results found for this basename: AST:5,ALT:5,ALKPHOS:5,BILITOT:5,PROT:5,ALBUMIN:5 in the last 168 hours No results found for this basename: LIPASE:5,AMYLASE:5 in the last 168 hours No results found for this basename: AMMONIA:5 in the last 168 hours CBC: No results found for this basename: WBC:5,NEUTROABS:5,HGB:5,HCT:5,MCV:5,PLT:5 in the last  168 hours Cardiac Enzymes: No results found for this basename: CKTOTAL:5,CKMB:5,CKMBINDEX:5,TROPONINI:5 in the last 168 hours BNP: No components found with this basename: POCBNP:5 CBG: No results found for this basename: GLUCAP:5 in the last 168 hours  No results found for this or any previous visit (from the past 240 hour(s)).   Scheduled Meds:   . docusate sodium  100 mg Oral BID  . folic acid  1 mg Oral Daily  . lacosamide  300 mg Oral BID  . levETIRAcetam  2,000 mg Oral Q12H  . LORazepam  0.5 mg Oral BID  . phenobarbital  64.8 mg Oral QHS  . phenytoin  200 mg Oral BID  . Rivaroxaban  20 mg Oral Q supper  . sertraline  50 mg Oral Daily  . thiamine  100 mg Oral Daily  . topiramate  100 mg Oral BID   Continuous Infusions:    Robt Okuda, DO  [facility] Pager [phone]  If 7PM-7AM, please contact night-coverage [email or web address] Password [email or web address] 04/18/2012, 9:17 AM   LOS: 23 days

## 2012-04-18 NOTE — Progress Notes (Signed)
Pt spent the night in his room with a sitter at the bedside. There was no call from pt step dad or no ride to pick pt up to go home. Pt refuses to get a Taxi and gets agitated when asked about his ride or what time the ride is going to get here. Pt remains in the room with Suicide precaution in place. Sitter at bedside. Will continue to monitor.

## 2012-04-18 NOTE — Progress Notes (Signed)
04/17/2012 pt keeps on refusing transportation assistance saying his step dad is coming to get him and that he had to work till 2330 in other to leave [facility] to get here in [place] . Pt asks nurse to  Contact pharmacy for him to get all his home meds sent down to to them. Pt states he's tired of pretending to be something he's not and that once he get his home meds from pharmacy and pick up his prescribed meds from pharmacy he was going to OD to commit suicide and that this time he was not going to miss and no one will be able to stop him. Pt expressed his grief of losing his daughter and her mother due to an accident in July and that his daughter's birthday was 12/11. Pt said YES he was suicidal when asked and that his plan is to OD with his prescriptions. Charge nurse was called to the room; On-call NP and AC notified by Consulting civil engineer. I was able to sit and talk with pt about his feelings and he said it helped to speak with someone. Pt sometimes was getting agitated with staff and paranoid with voices from outside his door. Pt thought people were talking about him and more Irritable when asked about his ride and what time his step dad was going to pick him up. Pt has a sitter with him in the room. Sleeping quietly. Will continue to monitor.

## 2012-04-19 DIAGNOSIS — F1994 Other psychoactive substance use, unspecified with psychoactive substance-induced mood disorder: Secondary | ICD-10-CM

## 2012-04-19 MED ORDER — SERTRALINE HCL 50 MG PO TABS
75.0000 mg | ORAL_TABLET | Freq: Every day | ORAL | Status: DC
  Administered 2012-04-19 – 2012-04-20 (×2): 75 mg via ORAL
  Filled 2012-04-19 (×2): qty 1

## 2012-04-19 NOTE — Progress Notes (Signed)
[facility] PROGRESS NOTE  [name] [name] ZOX:[id number] DOB: 1980/07/30 DOA: 03/26/2012 PCP: Sheila Oats, MD  Assessment/Plan: Seizure disorder / refractory epilepsy  -some concern that he is having pseudoseizures- over the past few days pt has been reporting no further seizures (apparently has a strong aura) since 04/09/12.  -pt had 2 seizures type events on night of 04/15/12 when his step dad did not arrive to pick patient up. It was noted that the patient pushed nurse call button immediately before have the event  -neurology was reconsulted  -Neurology felt patient may benefit from a transfer to a tertiary care center for EMU monitoring as they felt patient may have a component of pseudoseizure  -This was discussed with the patient and the patient adamantly refused any type of transfer to any other facility for further evaluation  -This risks, benefits, and alternatives were discussed with the patient and he expressed understanding and continued to refuse transfer to tertiary care center  -Pt. Did not have any further seizure like activity for >36 hrs and given the patient's refusal to go to a tertiary care center, the patient will be discharged home with followup with his neurologist in [place], [place]  -Arrangements were made for the patient to obtain 30 days of his antiepiletic medications here in [place] prior to his leaving for [place]  -Continue current medications with vimpat, keppra, dilantin, topamax,phenobarbital. Neurology titrating medications. Taper Ativan per neurology recommendations.  -outpatient follow up with his neurologist.  Major depressive disorder/SI  -PATIENT IS MEDICALLY STABLE FOR TRANSFER TO [facility] -PATIENT HAS NOT HAD IV ACCESS ANYWAY -04/17/12--pt c/o SI, sitter in room  -appreciate psychiatry followup -increase zoloft -Psych feels patient is competent. Sertraline dosing at the suggestion of Pscyh.  DVT RUE  Xarelto - patient has refused lab  draws, therefore not a warfarin candidate  -patient continues to refuse all lab draws since 03/31/12  -continue xarelto indefinitely due to hx of protein C deficiency  -pt will need follow up with his primary care physician  -pt was on xarelto for 3-4 weeks prior to this admission  Difficult IV access  -unable to establish peripheral IV despite multiple attempts 11/24 - per Dr. 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 Could not access a collateral vein in right neck. Attempt made to access left brachial vein above elbow. [Given DVT RUE] may have to use femoral vein in future for access." - at present there is no absolute requirement for an IV (seiz has been arrested previously w/ IM ativan)  -pt continues to refuse lab draws  Extensive dental caries  per Dr. Kristin Bruins, patient needs outpatient oral surgeon - Dr. Florene Route as outpatient for possible dental extraction for dental caries after transfer to behavioral health - appointment with with Dr. Florene Route can be arranged at [phone].  Alcohol abuse  Well beyond the window for withdrawal. Stable.  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 Patient to follow up with MD managing his pain medications as outpatient on discharge.  Code Status: FULL  Consultants:  Neuro  Psych  Procedures:  none  Antibiotics:  none  DVT prophylaxis:  xarelto  Family Communication: Sitter at bedside  Disposition Plan:  [facility]           Procedures/Studies: Dg Mandible 4 Views  03/26/2012  *RADIOLOGY REPORT*  Clinical Data: Patient fell hitting right lower  jaw  MANDIBLE - 4+ VIEW  Comparison: None.  Findings: By plain film, no mandibular fracture is seen.  The mandibular condyles appear to be in normal position.  There are lucencies within several teeth particularly  the  molars consistent with dental caries.  The sinuses are clear.  The zygomatic arches are intact.  IMPRESSION: No maxillofacial fracture is seen by plain film.  Probable dental caries.   Original Report Authenticated By: Dwyane Dee, M.D.          Subjective: Patient continues to have intermittent suicidal ideation. He currently denies any headache, chest pain, shortness breath, nausea, vomiting, diarrhea, abdominal pain. He continues to refuse any blood draws.  Objective: Filed Vitals:   04/18/12 0944 04/18/12 1400 04/18/12 1800 04/19/12 0130  BP: 106/66 108/61 129/77 112/74  Pulse: 72 70 69 79  Temp: 97.2 F (36.2 C) 97.6 F (36.4 C) 98.1 F (36.7 C) 98.7 F (37.1 C)  TempSrc: Oral Oral Oral Oral  Resp: 16 16 16 18   Height:      Weight:      SpO2: 100% 100% 100% 100%    Intake/Output Summary (Last 24 hours) at 04/19/12 1023 Last data filed at 04/18/12 1207  Gross per 24 hour  Intake    240 ml  Output      0 ml  Net    240 ml   Weight change:  Exam:   General:  Pt is alert, follows commands appropriately, not in acute distress  HEENT: No icterus, No thrush,[place]/AT  Cardiovascular: RRR, S1/S2, no rubs, no gallops  Respiratory: CTA bilaterally, no wheezing, no crackles, no rhonchi  Abdomen: Soft/+BS, non tender, non distended, no guarding  Extremities: trace edema, No lymphangitis, No petechiae, No rashes, no synovitis  Data Reviewed: Basic Metabolic Panel: No results found for this basename: NA:5,K:5,CL:5,CO2:5,GLUCOSE:5,BUN:5,CREATININE:5,CALCIUM:5,MG:5,PHOS:5 in the last 168 hours Liver Function Tests: No results found for this basename: AST:5,ALT:5,ALKPHOS:5,BILITOT:5,PROT:5,ALBUMIN:5 in the last 168 hours No results found for this basename: LIPASE:5,AMYLASE:5 in the last 168 hours No results found for this basename: AMMONIA:5 in the last 168 hours CBC: No results found for this basename: WBC:5,NEUTROABS:5,HGB:5,HCT:5,MCV:5,PLT:5 in the last 168  hours Cardiac Enzymes: No results found for this basename: CKTOTAL:5,CKMB:5,CKMBINDEX:5,TROPONINI:5 in the last 168 hours BNP: No components found with this basename: POCBNP:5 CBG: No results found for this basename: GLUCAP:5 in the last 168 hours  No results found for this or any previous visit (from the past 240 hour(s)).   Scheduled Meds:   . docusate sodium  100 mg Oral BID  . folic acid  1 mg Oral Daily  . lacosamide  300 mg Oral BID  . levETIRAcetam  2,000 mg Oral Q12H  . LORazepam  0.5 mg Oral BID  . phenobarbital  64.8 mg Oral QHS  . phenytoin  200 mg Oral BID  . Rivaroxaban  20 mg Oral Q supper  . sertraline  75 mg Oral Daily  . thiamine  100 mg Oral Daily  . topiramate  100 mg Oral BID   Continuous Infusions:    Gino Garrabrant, DO  [facility] Pager [phone]  If 7PM-7AM, please contact night-coverage [email or web address] Password [email or web address] 04/19/2012, 10:23 AM   LOS: 24 days

## 2012-04-19 NOTE — Consult Note (Signed)
Patient Identification:  [name] [name] Date of Evaluation:  04/19/2012 Reason for Consult:  SI  Referring Provider: Dr. Rosalita Levan  HPI:   Was transferred from [facility] for seizure. Was doing better in terms his mood and depression until his planned discharge this week.   Interval Hx:  Seen today. Reports being more depressed and also reports SI at times now. No specific plans to kill himself at this time. Thinks he just feels down and not feeling ready to leave. Reports no seizure today or yesterday. Thinks he has headache today. Thinks his recent depression is related with the anniversary to her dead daughter.    Past Psychiatric History: Congenital seizure disorder, Medication seeking [asks for stimulant for ADHD], receives pain medication, drinks beer- 2-3 beers; denies tobacco, recreational drugs.   Past Medical History:     Past Medical History  Diagnosis Date  . Seizure   . Protein C deficiency   . Protein C deficiency        Past Surgical History  Procedure Date  . Insertion of vena cava filter     Allergies:  Allergies  Allergen Reactions  . Fish Allergy Anaphylaxis and Rash  . Depakote (Divalproex Sodium) Swelling  . Morphine And Related Hives    Pt tolerated IV morphine 03/25/2012 admission. Pt tolerated PO morphine 03/29/2012.    Current Medications:  Prior to Admission medications   Medication Sig Start Date End Date Taking? Authorizing Provider  lacosamide 100 MG TABS Take 1 tablet (100 mg total) by mouth 2 (two) times daily. 03/16/12   Meredeth Ide, MD  levETIRAcetam (KEPPRA) 750 MG tablet Take 2 tablets (1,500 mg total) by mouth 2 (two) times daily. 03/16/12   Meredeth Ide, MD  phenytoin (DILANTIN) 100 MG ER capsule Take 2 capsules (200 mg total) by mouth 2 (two) times daily. 03/16/12   Meredeth Ide, MD  Rivaroxaban (XARELTO) 15 MG TABS tablet Take 1 tablet (15 mg total) by mouth 2 (two) times daily. Take 15 mg po daily for 20 days then  Continue to take 20 mg  po daily 03/16/12   Meredeth Ide, MD  sulfamethoxazole-trimethoprim (BACTRIM DS) 800-160 MG per tablet Take 1 tablet by mouth every 12 (twelve) hours. 03/16/12   Meredeth Ide, MD  thiamine 100 MG tablet Take 1 tablet (100 mg total) by mouth daily. 03/16/12   Meredeth Ide, MD  traMADol (ULTRAM) 50 MG tablet Take 1 tablet (50 mg total) by mouth every 6 (six) hours as needed. 03/16/12   Meredeth Ide, MD    Social History:    reports that he has been smoking Cigarettes.  He has a 1.25 pack-year smoking history. He has never used smokeless tobacco. He reports that he drinks about 3.6 ounces of alcohol per week. He reports that he uses illicit drugs (Marijuana).   Family History:    History reviewed. No pertinent family history.     Mental Status Examination/Evaluation:  Appearance: on bed  Eye Contact:: Good  Speech: slow  Volume: Normal  Mood: down  Affect: ristricted  Thought Process: organized  Orientation: Full  Thought Content: NO AVH  Suicidal Thoughts: yes on and off but no specific plans  Homicidal Thoughts: no  Memory: fair  Judgement: Impaired  Insight: Lacking  Psychomotor Activity: Normal  Concentration: Fair  Recall: Fair  Akathisia: No  DIAGNOSIS:   I.  Depressive d.o nos, r/o substance induced mood d/do, Congenital Seizure Disorder; Pain medication addiction, Drug-seeking behavior History  of non-adherence to medical treatments, diagnostics and management recommendations.    RECOMMENDATION:    1.  Will recommend to increase zoloft to 100 mg qd for depressive symptoms  2. Will recommend transfer to [facility] once medical cleared for safety and for his current depressive symtpoms. [facility] will not have any IV access   3. Will sign off at this time. Thanks for consult  Wonda Cerise MD 04/19/2012 3:32 PM      Review of Systems  HENT: Negative.      Physical Exam

## 2012-04-20 ENCOUNTER — Inpatient Hospital Stay (HOSPITAL_COMMUNITY)
Admission: AD | Admit: 2012-04-20 | Discharge: 2012-05-13 | DRG: 881 | Disposition: A | Discharge status: Home / Self Care | Payer: Federal, State, Local not specified - Other | Source: Ambulatory Visit | Attending: Psychiatry | Admitting: Psychiatry

## 2012-04-20 DIAGNOSIS — F1020 Alcohol dependence, uncomplicated: Secondary | ICD-10-CM

## 2012-04-20 DIAGNOSIS — F1994 Other psychoactive substance use, unspecified with psychoactive substance-induced mood disorder: Secondary | ICD-10-CM | POA: Diagnosis present

## 2012-04-20 DIAGNOSIS — G40909 Epilepsy, unspecified, not intractable, without status epilepticus: Secondary | ICD-10-CM

## 2012-04-20 DIAGNOSIS — G40901 Epilepsy, unspecified, not intractable, with status epilepticus: Secondary | ICD-10-CM

## 2012-04-20 DIAGNOSIS — Z634 Disappearance and death of family member: Secondary | ICD-10-CM

## 2012-04-20 DIAGNOSIS — Z765 Malingerer [conscious simulation]: Secondary | ICD-10-CM

## 2012-04-20 DIAGNOSIS — F1210 Cannabis abuse, uncomplicated: Secondary | ICD-10-CM | POA: Diagnosis present

## 2012-04-20 DIAGNOSIS — Z79899 Other long term (current) drug therapy: Secondary | ICD-10-CM

## 2012-04-20 DIAGNOSIS — R45851 Suicidal ideations: Secondary | ICD-10-CM

## 2012-04-20 DIAGNOSIS — R52 Pain, unspecified: Secondary | ICD-10-CM

## 2012-04-20 DIAGNOSIS — I82409 Acute embolism and thrombosis of unspecified deep veins of unspecified lower extremity: Secondary | ICD-10-CM

## 2012-04-20 DIAGNOSIS — F4321 Adjustment disorder with depressed mood: Secondary | ICD-10-CM | POA: Diagnosis present

## 2012-04-20 DIAGNOSIS — L0390 Cellulitis, unspecified: Secondary | ICD-10-CM

## 2012-04-20 DIAGNOSIS — F329 Major depressive disorder, single episode, unspecified: Principal | ICD-10-CM | POA: Diagnosis present

## 2012-04-20 DIAGNOSIS — F411 Generalized anxiety disorder: Secondary | ICD-10-CM | POA: Diagnosis present

## 2012-04-20 DIAGNOSIS — F3289 Other specified depressive episodes: Principal | ICD-10-CM | POA: Diagnosis present

## 2012-04-20 MED ORDER — LACOSAMIDE 200 MG PO TABS
300.0000 mg | ORAL_TABLET | Freq: Two times a day (BID) | ORAL | Status: DC
  Administered 2012-04-20 – 2012-05-13 (×46): 300 mg via ORAL
  Filled 2012-04-20 (×11): qty 2
  Filled 2012-04-20: qty 1
  Filled 2012-04-20 (×17): qty 2
  Filled 2012-04-20: qty 9
  Filled 2012-04-20 (×14): qty 2
  Filled 2012-04-20: qty 1
  Filled 2012-04-20 (×4): qty 2

## 2012-04-20 MED ORDER — RIVAROXABAN 20 MG PO TABS
20.0000 mg | ORAL_TABLET | Freq: Every day | ORAL | Status: DC
  Administered 2012-04-21 – 2012-05-12 (×22): 20 mg via ORAL
  Filled 2012-04-20 (×26): qty 1

## 2012-04-20 MED ORDER — SERTRALINE HCL 50 MG PO TABS
75.0000 mg | ORAL_TABLET | Freq: Every day | ORAL | Status: DC
  Administered 2012-04-21 – 2012-04-27 (×7): 75 mg via ORAL
  Filled 2012-04-20 (×8): qty 1

## 2012-04-20 MED ORDER — LEVETIRACETAM 750 MG PO TABS
2000.0000 mg | ORAL_TABLET | Freq: Two times a day (BID) | ORAL | Status: DC
  Administered 2012-04-20 – 2012-05-13 (×46): 2000 mg via ORAL
  Filled 2012-04-20 (×51): qty 1

## 2012-04-20 MED ORDER — NICOTINE POLACRILEX 2 MG MT GUM
2.0000 mg | CHEWING_GUM | OROMUCOSAL | Status: DC | PRN
  Administered 2012-04-20 – 2012-05-02 (×121): 2 mg via ORAL
  Filled 2012-04-20 (×14): qty 1

## 2012-04-20 MED ORDER — ACETAMINOPHEN 325 MG PO TABS: 650.0000 mg | ORAL_TABLET | Freq: Four times a day (QID) | ORAL | Status: DC | PRN

## 2012-04-20 MED ORDER — DOCUSATE SODIUM 100 MG PO CAPS
100.0000 mg | ORAL_CAPSULE | Freq: Two times a day (BID) | ORAL | Status: DC
  Administered 2012-04-23 – 2012-05-12 (×24): 100 mg via ORAL
  Filled 2012-04-20 (×53): qty 1

## 2012-04-20 MED ORDER — MORPHINE SULFATE 15 MG PO TABS
30.0000 mg | ORAL_TABLET | Freq: Three times a day (TID) | ORAL | Status: DC | PRN
  Administered 2012-04-20 – 2012-05-01 (×33): 30 mg via ORAL
  Filled 2012-04-20 (×9): qty 2
  Filled 2012-04-20: qty 1
  Filled 2012-04-20 (×6): qty 2
  Filled 2012-04-20: qty 1
  Filled 2012-04-20 (×17): qty 2

## 2012-04-20 MED ORDER — FOLIC ACID 1 MG PO TABS
1.0000 mg | ORAL_TABLET | Freq: Every day | ORAL | Status: DC
  Administered 2012-04-21 – 2012-05-13 (×23): 1 mg via ORAL
  Filled 2012-04-20 (×25): qty 1

## 2012-04-20 MED ORDER — PHENOBARBITAL 32.4 MG PO TABS
64.8000 mg | ORAL_TABLET | Freq: Every day | ORAL | Status: DC
  Administered 2012-04-20 – 2012-05-12 (×23): 64.8 mg via ORAL
  Filled 2012-04-20 (×23): qty 2

## 2012-04-20 MED ORDER — TOPIRAMATE 100 MG PO TABS
100.0000 mg | ORAL_TABLET | Freq: Two times a day (BID) | ORAL | Status: DC
  Administered 2012-04-20 – 2012-05-13 (×46): 100 mg via ORAL
  Filled 2012-04-20 (×53): qty 1

## 2012-04-20 MED ORDER — LORAZEPAM 0.5 MG PO TABS
0.5000 mg | ORAL_TABLET | Freq: Two times a day (BID) | ORAL | Status: DC
  Administered 2012-04-21 – 2012-05-13 (×45): 0.5 mg via ORAL
  Filled 2012-04-20 (×45): qty 1

## 2012-04-20 MED ORDER — VITAMIN B-1 100 MG PO TABS
100.0000 mg | ORAL_TABLET | Freq: Every day | ORAL | Status: DC
  Administered 2012-04-21 – 2012-05-13 (×23): 100 mg via ORAL
  Filled 2012-04-20 (×24): qty 1

## 2012-04-20 MED ORDER — SERTRALINE HCL 25 MG PO TABS: 75.0000 mg | ORAL_TABLET | Freq: Every day | ORAL | Status: DC

## 2012-04-20 MED ORDER — MAGNESIUM HYDROXIDE 400 MG/5ML PO SUSP: 30.0000 mL | Freq: Every day | ORAL | Status: DC | PRN

## 2012-04-20 MED ORDER — PHENYTOIN SODIUM EXTENDED 100 MG PO CAPS
200.0000 mg | ORAL_CAPSULE | Freq: Two times a day (BID) | ORAL | Status: DC
  Administered 2012-04-20 – 2012-05-13 (×46): 200 mg via ORAL
  Filled 2012-04-20 (×52): qty 2

## 2012-04-20 MED ORDER — ALUM & MAG HYDROXIDE-SIMETH 200-200-20 MG/5ML PO SUSP: 30.0000 mL | ORAL | Status: DC | PRN

## 2012-04-20 NOTE — Progress Notes (Signed)
Patient ID: [name] [name], male   DOB: [birth year], 31 y.o.   MRN: [id number] Patient admitted voluntarily for help with depression and SI. He reports having thoughts of overdosing. Patient describes stressor of wife and child being killed in a car accident in July of this year. He reports functioning well prior to this event. The patient was here at [facility] for help earlier this year. Patient reports a long of history of seizures since childhood. He reports having multiple seizures during his last stay which he feels affected his mental health. The patient feels like he is doing better now. Last reported seizure per [facility] staff was 04/15/12. Patient appears very flat and depressed during the admission. He was very cooperative during the process and able to complete all the necessary paperwork. Patient oriented to the 500 hall unit and routine. Patient very concerned about getting all his seizure medications ordered correctly. He was receptive to assurance that his concern would be handled appropriately.

## 2012-04-20 NOTE — Progress Notes (Signed)
Chaplain attempted a visit but pt declined to visit, saying he was in a "bad mood." Chaplain learned from pt's nurse that pt will be transferring back to [facility] later today.

## 2012-04-20 NOTE — Progress Notes (Signed)
Clinical Social Work  CSW faxed referrals to [facility], [facility], [facility] and [facility]. [facility] reports they received referral but no do not have any beds at this time. [facility] agreeable to contact CSW once bed becomes available.  [facility] reports no beds available. [facility] has beds available but reports that patient has been declined due to high acuity at their hospital right now.  [facility] is unable to accept patient because they do not have funding for [facility]. CSW will continue to follow.  [place], [place] [phone]

## 2012-04-20 NOTE — Discharge Summary (Signed)
Physician Discharge Summary  [name] [name] WGN:[id number] DOB: 07/18/1980 DOA: 03/26/2012  PCP: Sheila Oats, MD  Admit date: 03/26/2012 Discharge date: 04/20/2012  Recommendations for Outpatient Follow-up:  1. Pt will need to follow up with PCP in 2 weeks post discharge 2. Follow up with outpatient neurology after discharge from behavioral health.  Discharge Diagnoses:  Seizure disorder / refractory epilepsy  -some concern that he is having pseudoseizures- over the past few days pt has been reporting no further seizures (apparently has a strong aura) since 04/09/12.  -pt had 2 seizures type events on night of 04/15/12 when his step dad did not arrive to pick patient up. It was noted that the patient pushed nurse call button immediately before have the event  -neurology was reconsulted  -Neurology felt patient may benefit from a transfer to a tertiary care center for EMU monitoring as they felt patient may have a component of pseudoseizure  -This was discussed with the patient and the patient adamantly refused any type of transfer to any other facility for further evaluation  -This risks, benefits, and alternatives were discussed with the patient and he expressed understanding and continued to refuse transfer to tertiary care center  -It is believed that the patient has not been truthful regarding his transportation from his step father--he has refused all attempts from social work to arrange transportation for him to go back to [place] including arranging a taxi.  It is believed that the patient does not truly have any transportation, and there are other psychosocial issues interplaying in his situation -Arrangements were made for the patient to obtain 30 days of his antiepiletic medications here in [place] prior to his leaving for [facility]  -Continue current medications with vimpat, keppra, dilantin, topamax,phenobarbital. Neurology titrating medications. Taper Ativan per neurology  recommendations.  -outpatient follow up with his neurologist.  Major depressive disorder/SI  -PATIENT IS MEDICALLY STABLE FOR TRANSFER TO [facility]  -PATIENT HAS NOT HAD IV ACCESS ANYWAY  -04/17/12--pt c/o SI, sitter in room  -appreciate psychiatry followup  -increased zoloft to 75mg  daily D#2 today -Psych feels patient is competent. Sertraline dosing at the suggestion of Pscyh.  DVT RUE  Xarelto - patient has refused lab draws, therefore not a warfarin candidate  -patient continues to refuse all lab draws since 03/31/12  -continue xarelto indefinitely due to hx of protein C deficiency  -pt will need follow up with his primary care physician  -pt was on xarelto for 3-4 weeks prior to this admission  Difficult IV access  -unable to establish peripheral IV despite multiple attempts 11/24 - per Dr. 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 Could not access a collateral vein in right neck. Attempt made to access left brachial vein above elbow. [Given DVT RUE] may have to use femoral vein in future for access." - at present there is no absolute requirement for an IV (seiz has been arrested previously w/ IM ativan)  -pt continues to refuse lab draws  Extensive dental caries  per Dr. Kristin Bruins, patient needs outpatient oral surgeon - Dr. Florene Route as outpatient for possible dental extraction for dental caries after transfer to behavioral health - appointment with with Dr. Florene Route can be arranged at [phone].  Alcohol abuse  Well beyond the window for withdrawal. Stable.  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 Patient to follow up with  MD managing his pain medications as outpatient on discharge.  Code Status: FULL  Consultants:  Neuro  Psych  Procedures:  none  Antibiotics:  none  DVT prophylaxis:   xarelto  Family Communication: Sitter at bedside  Disposition Plan: [facility]   Discharge Condition: medically stable  Disposition:  Follow-up Information    Follow up with Florene Route.   Contact information:   [address], [address] [facility] [place] [address] [phone]       Call [facility]. (To schedule assessment for counseling and medication management)    Contact information:   Call [phone]      Call [facility] support groups. (If interested in joining a support group)    Contact information:   Call [phone]         Diet Regular Wt Readings from Last 3 Encounters:  04/20/12 70.7 kg (155 lb 13.8 oz)  03/23/12 69.854 kg (154 lb)  03/22/12 70.6 kg (155 lb 10.3 oz)      Consultants: Neurology psychiatry  Discharge Exam: Filed Vitals:   04/20/12 1548  BP: 117/69  Pulse: 70  Temp: 98 F (36.7 C)  Resp: 18   Filed Vitals:   04/19/12 2200 04/20/12 0500 04/20/12 0600 04/20/12 1548  BP: 109/61  113/66 117/69  Pulse: 72  62 70  Temp: 98.1 F (36.7 C)  97.7 F (36.5 C) 98 F (36.7 C)  TempSrc: Oral  Oral Oral  Resp: 20  20 18   Height:      Weight:  70.7 kg (155 lb 13.8 oz)    SpO2: 98%  100% 100%   General: A&O x 3, NAD, pleasant, cooperative Cardiovascular: RRR, no rub, no gallop, no S3 Respiratory: CTAB, no wheeze, no rhonchi Abdomen:soft, nontender, nondistended, positive bowel sounds Extremities: trace edema, No lymphangitis, no petechiae  Discharge Instructions      Discharge Orders    Future Orders Please Complete By Expires   Diet - low sodium heart healthy      Increase activity slowly      Discharge instructions      Comments:   Follow up with neurologist and psychiatrist in [place] in 2 weeks       Medication List     As of 04/20/2012  3:57 PM    STOP taking these medications         morphine 30 MG tablet   Commonly known as: MSIR      TAKE these medications         Lacosamide 150 MG Tabs    Take 2 tablets (300 mg total) by mouth 2 (two) times daily.      levETIRAcetam 1000 MG tablet   Commonly known as: KEPPRA   Take 2 tablets (2,000 mg total) by mouth every 12 (twelve) hours.      LORazepam 0.5 MG tablet   Commonly known as: ATIVAN   Take 1 tablet (0.5 mg total) by mouth 2 (two) times daily.      PHENobarbital 64.8 MG tablet   Commonly known as: LUMINAL   Take 1 tablet (64.8 mg total) by mouth at bedtime.      phenytoin 200 MG ER capsule   Commonly known as: DILANTIN   Take 1 capsule (200 mg total) by mouth 2 (two) times daily.      Rivaroxaban 20 MG Tabs   Commonly known as: XARELTO   Take 1 tablet (20 mg total) by mouth daily with supper.  Rivaroxaban 20 MG Tabs   Commonly known as: XARELTO   Take 20 mg by mouth daily with supper. Took 15 mg BID for 3 weeks (begun 11/19). To continue on 20 mg daily with supper.      sertraline 25 MG tablet   Commonly known as: ZOLOFT   Take 3 tablets (75 mg total) by mouth daily.      topiramate 100 MG tablet   Commonly known as: TOPAMAX   Take 1 tablet (100 mg total) by mouth 2 (two) times daily.          The results of significant diagnostics from this hospitalization (including imaging, microbiology, ancillary and laboratory) are listed below for reference.    Significant Diagnostic Studies: Dg Mandible 4 Views  03/26/2012  *RADIOLOGY REPORT*  Clinical Data: Patient fell hitting right lower jaw  MANDIBLE - 4+ VIEW  Comparison: None.  Findings: By plain film, no mandibular fracture is seen.  The mandibular condyles appear to be in normal position.  There are lucencies within several teeth particularly the  molars consistent with dental caries.  The sinuses are clear.  The zygomatic arches are intact.  IMPRESSION: No maxillofacial fracture is seen by plain film.  Probable dental caries.   Original Report Authenticated By: Dwyane Dee, M.D.      Microbiology: No results found for this or any previous visit (from  the past 240 hour(s)).   Labs: Basic Metabolic Panel: No results found for this basename: NA:5,K:2,CL:5,CO2:5,GLUCOSE:5,BUN:5,CREATININE:5,CALCIUM:5,MG:5,PHOS:5 in the last 168 hours Liver Function Tests: No results found for this basename: AST:5,ALT:5,ALKPHOS:5,BILITOT:5,PROT:5,ALBUMIN:5 in the last 168 hours No results found for this basename: LIPASE:5,AMYLASE:5 in the last 168 hours No results found for this basename: AMMONIA:5 in the last 168 hours CBC: No results found for this basename: WBC:5,NEUTROABS:5,HGB:5,HCT:5,MCV:5,PLT:5 in the last 168 hours Cardiac Enzymes: No results found for this basename: CKTOTAL:5,CKMB:5,CKMBINDEX:5,TROPONINI:5 in the last 168 hours BNP: No components found with this basename: POCBNP:5 CBG: No results found for this basename: GLUCAP:5 in the last 168 hours  Time coordinating discharge:  Greater than 30 minutes  Signed:  Dorrance Sellick, DO [facility] Pager: [phone] 04/20/2012, 3:57 PM

## 2012-04-20 NOTE — Progress Notes (Signed)
Pt been cooperative, calm and quiet today. Gave d/c instructions and transferred  him to [facility] with supervision of a security and a nurse tech. Called [facility] and gave report. No concerns.

## 2012-04-20 NOTE — Progress Notes (Signed)
Progress Notes sp consultation Patient Identification:  [name] [name] Date of Evaluation:  04/20/2012 Reason for Consult:  Congenital Seizures, Suicidal Ideation  Referring Provider: Dr. Arbutus Leas History of Present Illness:Congenital Seizures poorly controlled.  Pt was first at [facility], Seizures began and pt was transferred to [facility] for several weeks without resolution of seizure events.  Gradually, with adjustment several times of medications, seizures tapered in frequency.  Most recent issue has evolved since pt announced that his step-father would take him home.  His father did not show the day of stated discharge.  Dr. Arbutus Leas observed that the pt put on his light for the nurse.  When she arrived he announced he had another seizure.  From that time on, with no other calls to pt from his father, he became despondent and suicidal  Past Psychiatric History: Polysubstance abuse, non-adherence to medical regimen; spontaneous objections to tests/procedures.   Past Medical History:     Past Medical History  Diagnosis Date  . Seizure   . Protein C deficiency   . Protein C deficiency        Past Surgical History  Procedure Date  . Insertion of vena cava filter     Allergies:  Allergies  Allergen Reactions  . Fish Allergy Anaphylaxis and Rash  . Depakote (Divalproex Sodium) Swelling  . Morphine And Related Hives    Pt tolerated IV morphine 03/25/2012 admission. Pt tolerated PO morphine 03/29/2012.    Current Medications:  Prior to Admission medications   Medication Sig Start Date End Date Taking? Authorizing Provider  morphine (MSIR) 30 MG tablet Take 30 mg by mouth every 6 (six) hours as needed. Pain.  Patient reports taking prior to admission, with Rx from [facility] in [place]. Unable to verify. Last pain med at [facility] [phone]) was Oxycodone in 2011;  Patient states he has a supply at home, and will NOT need a prescription for this at discharge.    Yes Historical Provider, MD  Rivaroxaban (XARELTO) 20 MG TABS Take 20 mg by mouth daily with supper. Took 15 mg BID for 3 weeks (begun 11/19). To continue on 20 mg daily with supper.   Yes Historical Provider, MD  lacosamide 150 MG TABS Take 2 tablets (300 mg total) by mouth 2 (two) times daily. 04/15/12   Catarina Hartshorn, MD  levETIRAcetam (KEPPRA) 1000 MG tablet Take 2 tablets (2,000 mg total) by mouth every 12 (twelve) hours. 04/15/12   Catarina Hartshorn, MD  LORazepam (ATIVAN) 0.5 MG tablet Take 1 tablet (0.5 mg total) by mouth 2 (two) times daily. 04/15/12   Catarina Hartshorn, MD  PHENobarbital (LUMINAL) 64.8 MG tablet Take 1 tablet (64.8 mg total) by mouth at bedtime. 04/15/12   Catarina Hartshorn, MD  phenytoin (DILANTIN) 200 MG ER capsule Take 1 capsule (200 mg total) by mouth 2 (two) times daily. 04/15/12   Catarina Hartshorn, MD  Rivaroxaban (XARELTO) 20 MG TABS Take 1 tablet (20 mg total) by mouth daily with supper. 04/15/12   Catarina Hartshorn, MD  sertraline (ZOLOFT) 50 MG tablet Take 1 tablet (50 mg total) by mouth daily. 04/15/12   Catarina Hartshorn, MD  topiramate (TOPAMAX) 100 MG tablet Take 1 tablet (100 mg total) by mouth 2 (two) times daily. 04/15/12   Catarina Hartshorn, MD    Social History:    reports that he has been smoking Cigarettes.  He has a 1.25 pack-year smoking history. He has never used smokeless tobacco. He reports that he drinks about 3.6 ounces  of alcohol per week. He reports that he uses illicit drugs (Marijuana).   Family History:    History reviewed. No pertinent family history.  Mental Status Examination/Evaluation: Objective:  Appearance: Casual  Eye Contact::  Good  Speech:  Clear and Coherent  Volume:  Decreased  Mood:  depressed  Affect:  Appropriate  Thought Process:  Coherent  Orientation:  Full (Time, Place, and Person)  Thought Content:  Rumination  Suicidal Thoughts:  Yes.  without intent/plan  Homicidal Thoughts:  No  Judgement:  Fair  Insight:  Fair   DIAGNOSIS:   AXIS I   Mental status  Changes related to serial seizures, Bereavement, Suicidal Ideation, Polysubstance Abuse/Dependence  AXIS II  Deferred  AXIS III See medical notes.  AXIS IV housing problems, occupational problems, other psychosocial or environmental problems, problems related to social environment, problems with primary support group and inability to follow medical regimen, at times disrupting care.  loss of wife and 55 yo daughter  AXIS V 10-50 serious symptoms   Assessment/Plan:  Discussed with Psych CSW Pt is in darkened room watching TV.  He says he is very depressed with thoughts of plans of committing suicide.   He says he cannot provide any information about his step father and does not want any of his family contacted.  He denies active thoughts of suicide while talking.   Pt says he has not had a seizure up till now.  RECOMMENDATION:  1.  Pt admits to suicidal ideation, at this time of Christmas.  thinking of his daughter who died, 2.  Pt declines all contact with his family members.  3.  Pt is referred to [facility] to consider inpatient admission for depression and suicidal ideation.  4.  Suggest sitter until transfer is determined.  5.  No futher psychiatric needs.  MD Psychiatrist signs off.  Jahkai Yandell MD 04/20/2012 10:40 AM

## 2012-04-20 NOTE — Progress Notes (Addendum)
Clinical Social Work  CSW spoke with [facility] (in [place]). CSW explained barrier to finding placement in [facility] due to patient not having insurance and needing [employer]. [facility] does not have any beds but reports their sister facility [facility]) might have something available. Patient signed ROI and CSW faxed referral to [place] at [facility]. CSW will continue to follow in order to find placement for patient. CSW has kept patient updated on status of bed offers.  [place], [place] [phone]

## 2012-04-20 NOTE — Progress Notes (Signed)
Clinical Social Work  Patient accepted to [facility] bed 505-2. RN to call report to [phone]. CSW informed patient who signed voluntary consent and CSW faxed form to [facility]. RN and MD aware of bed at [facility]. CSW coordinated transportation via security. CSW is signing off.  [place], [place] [phone]

## 2012-04-20 NOTE — Progress Notes (Signed)
Clinical Social Work Progress Note PSYCHIATRY SERVICE LINE 04/20/2012  Patient:  [name] [name]  Account:  [id number]  Admit Date:  03/26/2012  Clinical Social Worker:  Unk Lightning, LCSW  Date/Time:  04/20/2012 09:00 AM  Review of Patient  Overall Medical Condition:   MD reports patient medically stable for dc. Patient requesting inpatient treatment for SI.   Participation Level:  Minimal  Participation Quality  Drowsy   Other Participation Quality:   Affect  Flat   Cognitive  Appropriate   Reaction to Medications/Concerns:   Psych MD increased Zoloft to 100mg  qd   Modes of Intervention  Support   Summary of Progress/Plan at Discharge   CSW was consulted due to patient voicing SI over the weekend. CSW reviewed chart and spoke with bedside RN. CSW met with patient at bedside. Sitter present.    Patient laying in bed with covers over his head. Patient reports that he is having a difficult time managing his symptoms due to depression. Patient reports that it was difficult to spend his dtr's birthday in the hospital (dtr has passed away) and is struggling with his emotions. Patient feels he needs inpatient treatment in order to develop coping skills. Patient currently reports SI and has a plan to OD on medications once he gets home.    Patient reports stepfather has been "like my own dad" and supportive yet stepfather has not been to the hospital. Patient reports that stepfather will come to visit and provide transportation home but never arrives. CSW asked more information regarding stepfather in order to get permission to speak with stepfather. Patient will not provide stepfather's name or number and does not want his family to be involved with care at this time. CSW inquired about any home issues and inquired if patient was avoiding going home. Patient was not receptive to discuss this matter.    Patient had a flat affect and was disengaged. Patient had minimal eye contact.  This could be related to the fact that it was morning and patient recently woke up. Patient eager to receive inpatient treatment but does not desire family involvement at this time.    Patient signed ROI forms and CSW actively seeking placement for patient.

## 2012-04-20 NOTE — Progress Notes (Signed)
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  -pt had 2 seizures type events on night of 04/15/12 when his step dad did not arrive to pick patient up. It was noted that the patient pushed nurse call button immediately before have the event  -neurology was reconsulted  -Neurology felt patient may benefit from a transfer to a tertiary care center for EMU monitoring as they felt patient may have a component of pseudoseizure  -This was discussed with the patient and the patient adamantly refused any type of transfer to any other facility for further evaluation  -This risks, benefits, and alternatives were discussed with the patient and he expressed understanding and continued to refuse transfer to tertiary care center  -Pt. Did not have any further seizure like activity for >36 hrs and given the patient's refusal to go to a tertiary care center, the patient will be discharged home with followup with his neurologist in [place], [place]  -Arrangements were made for the patient to obtain 30 days of his antiepiletic medications here in [place] prior to his leaving for [place]  -Continue current medications with vimpat, keppra, dilantin, topamax,phenobarbital. Neurology titrating medications. Taper Ativan per neurology recommendations.  -outpatient follow up with his neurologist.  Major depressive disorder/SI  -PATIENT IS MEDICALLY STABLE FOR TRANSFER TO Behavioral Health -PATIENT HAS NOT HAD IV ACCESS ANYWAY  -04/17/12--pt c/o SI, sitter in room  -appreciate psychiatry  followup  -increase zoloft  -Psych feels patient is competent. Sertraline dosing at the suggestion of Pscyh.  DVT RUE  Xarelto - patient has refused lab draws, therefore not a warfarin candidate  -patient continues to refuse all lab draws since 03/31/12  -continue xarelto indefinitely due to hx of protein C deficiency  -pt will need follow up with his primary care physician  -pt was on xarelto for 3-4 weeks prior to this admission  Difficult IV access  -unable to establish peripheral IV despite multiple attempts 11/24 - per Dr. 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 Could not access a collateral vein in right neck. Attempt made to access left brachial vein above elbow. [Given DVT RUE] may have to use femoral vein in future for access." - at present there is no absolute requirement for an IV (seiz has been arrested previously w/ IM ativan)  -pt continues to refuse lab draws  Extensive dental caries  per Dr. Kristin Bruins, patient needs outpatient oral surgeon - Dr. Florene Route as outpatient for possible dental extraction for dental caries after transfer to behavioral health - appointment with with Dr. Florene Route can be arranged at [phone].  Alcohol abuse  Well beyond the window for withdrawal. Stable.  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 Patient to follow up with MD managing his pain medications as outpatient on discharge.  Code Status: FULL  Consultants:  Neuro  Psych  Procedures:  none  Antibiotics:  none  DVT prophylaxis:  xarelto  Family Communication: Sitter at bedside  Disposition Plan: [facility]  Procedures/Studies: Dg Mandible 4 Views  03/26/2012  *RADIOLOGY REPORT*  Clinical Data: Patient fell hitting right lower jaw  MANDIBLE - 4+ VIEW  Comparison: None.  Findings: By plain film, no  mandibular fracture is seen.  The mandibular condyles appear to be in normal position.  There are lucencies within several teeth particularly the  molars consistent with dental caries.  The sinuses are clear.  The zygomatic arches are intact.  IMPRESSION: No maxillofacial fracture is seen by plain film.  Probable dental caries.   Original Report Authenticated By: Dwyane Dee, M.D.          Subjective: Patient denies fevers, chills, chest pain, shortness breath, nausea, vomiting, diarrhea, abdominal pain. No dysuria or rashes.  Still with intermitten suicidal ideation.  Objective: Filed Vitals:   04/19/12 1518 04/19/12 2200 04/20/12 0500 04/20/12 0600  BP: 119/68 109/61  113/66  Pulse: 68 72  62  Temp: 97.9 F (36.6 C) 98.1 F (36.7 C)  97.7 F (36.5 C)  TempSrc:  Oral  Oral  Resp: 20 20  20   Height:      Weight:   70.7 kg (155 lb 13.8 oz)   SpO2: 100% 98%  100%    Intake/Output Summary (Last 24 hours) at 04/20/12 1526 Last data filed at 04/20/12 0817  Gross per 24 hour  Intake    240 ml  Output    400 ml  Net   -160 ml   Weight change:  Exam:   General:  Pt is alert, follows commands appropriately, not in acute distress  HEENT: No icterus, No thrush [place]/AT  Cardiovascular: RRR, S1/S2, no rubs, no gallops  Respiratory: CTA bilaterally, no wheezing, no crackles, no rhonchi  Abdomen: Soft/+BS, non tender, non distended, no guarding  Extremities: trace edema, No lymphangitis, No petechiae, No rashes, no synovitis  Data Reviewed: Basic Metabolic Panel: No results found for this basename: NA:5,K:5,CL:5,CO2:5,GLUCOSE:5,BUN:5,CREATININE:5,CALCIUM:5,MG:5,PHOS:5 in the last 168 hours Liver Function Tests: No results found for this basename: AST:5,ALT:5,ALKPHOS:5,BILITOT:5,PROT:5,ALBUMIN:5 in the last 168 hours No results found for this basename: LIPASE:5,AMYLASE:5 in the last 168 hours No results found for this basename: AMMONIA:5 in the last 168 hours CBC: No results  found for this basename: WBC:5,NEUTROABS:5,HGB:5,HCT:5,MCV:5,PLT:5 in the last 168 hours Cardiac Enzymes: No results found for this basename: CKTOTAL:5,CKMB:5,CKMBINDEX:5,TROPONINI:5 in the last 168 hours BNP: No components found with this basename: POCBNP:5 CBG: No results found for this basename: GLUCAP:5 in the last 168 hours  No results found for this or any previous visit (from the past 240 hour(s)).   Scheduled Meds:   . docusate sodium  100 mg Oral BID  . folic acid  1 mg Oral Daily  . lacosamide  300 mg Oral BID  . levETIRAcetam  2,000 mg Oral Q12H  . LORazepam  0.5 mg Oral BID  . phenobarbital  64.8 mg Oral QHS  . phenytoin  200 mg Oral BID  . Rivaroxaban  20 mg Oral Q supper  . sertraline  75 mg Oral Daily  . thiamine  100 mg Oral Daily  . topiramate  100 mg Oral BID   Continuous Infusions:    Kalanie Fewell, DO  [facility] Pager [phone]  If 7PM-7AM, please contact night-coverage [email or web address] Password [email or web address] 04/20/2012, 3:26 PM   LOS: 25 days

## 2012-04-20 NOTE — Progress Notes (Signed)
Clinical Social Work  Patient has been accept at [name] by Dr. Dub Mikes. No bed available. Per Minerva Areola at [facility], bed expected to be available today. [facility] and CSW will stay in contact and patient will be transferred when bed available.  [place], [place] [phone]

## 2012-04-21 ENCOUNTER — Encounter (HOSPITAL_COMMUNITY): Payer: Self-pay | Admitting: Psychiatry

## 2012-04-21 NOTE — H&P (Signed)
[name] [name] is an 31 y.o. male.   Chief Complaint: 4/10, 3/10 back pain HPI: Patient came to ED in October and admitted with suicidal ideations due to grief of his wife and daughter who were killed in a car accident in July.  His depression became worse and he stopped taking his medications for seizures.  When he was here the first time, he was sent to the medical hospital for seizures.  He was discharged here for further care for his depression, seizures stable.  Past Medical History  Diagnosis Date  . Seizure   . Protein C deficiency   . Protein C deficiency     Past Surgical History  Procedure Date  . Insertion of vena cava filter     History reviewed. No pertinent family history. Social History:  reports that he has been smoking Cigarettes.  He has a 1.25 pack-year smoking history. He has never used smokeless tobacco. He reports that he drinks about 3.6 ounces of alcohol per week. He reports that he uses illicit drugs (Marijuana).  Allergies:  Allergies  Allergen Reactions  . Fish Allergy Anaphylaxis and Rash  . Depakote (Divalproex Sodium) Swelling  . Morphine And Related Hives    Pt tolerated IV morphine 03/25/2012 admission. Pt tolerated PO morphine 03/29/2012.    Medications Prior to Admission  Medication Sig Dispense Refill  . lacosamide 150 MG TABS Take 2 tablets (300 mg total) by mouth 2 (two) times daily.  60 tablet  0  . levETIRAcetam (KEPPRA) 1000 MG tablet Take 2 tablets (2,000 mg total) by mouth every 12 (twelve) hours.  120 tablet  0  . LORazepam (ATIVAN) 0.5 MG tablet Take 1 tablet (0.5 mg total) by mouth 2 (two) times daily.  60 tablet  0  . PHENobarbital (LUMINAL) 64.8 MG tablet Take 1 tablet (64.8 mg total) by mouth at bedtime.  30 tablet  0  . phenytoin (DILANTIN) 200 MG ER capsule Take 1 capsule (200 mg total) by mouth 2 (two) times daily.  60 capsule  0  . Rivaroxaban (XARELTO) 20 MG TABS Take 20 mg by mouth every evening.      . sertraline (ZOLOFT)  25 MG tablet Take 3 tablets (75 mg total) by mouth daily.  90 tablet  0  . topiramate (TOPAMAX) 100 MG tablet Take 1 tablet (100 mg total) by mouth 2 (two) times daily.  60 tablet  0    No results found for this or any previous visit (from the past 48 hour(s)). No results found.  Review of Systems  Constitutional: Negative.   HENT: Negative.   Eyes: Negative.   Respiratory: Negative.   Cardiovascular: Negative.   Gastrointestinal: Negative.   Genitourinary: Negative.   Musculoskeletal: Positive for back pain.  Skin: Negative.   Neurological: Negative.   Endo/Heme/Allergies: Negative.   Psychiatric/Behavioral: Positive for depression.    Blood pressure 119/90, pulse 77, temperature 98.4 F (36.9 C), temperature source Oral, resp. rate 16, height 5\' 8"  (1.727 m), weight 72.576 kg (160 lb). Physical Exam   Assessment/Plan Review of chart, medications, vital signs, and notes. 1-Individual and group therapy 2-Medication management for depression and medical issues 3-Coping skills for depression and grieving 4-Supportive environment provided to optimize care  [name], PMH-NP 04/21/2012, 2:05 PM

## 2012-04-21 NOTE — Progress Notes (Signed)
Psychoeducational Group Note  Date:  04/21/2012 Time:  1100  Group Topic/Focus:  Wellness Toolbox:   The focus of this group is to discuss various aspects of wellness, balancing those aspects and exploring ways to increase the ability to experience wellness.  Patients will create a wellness toolbox for use upon discharge.  Participation Level:  Did not attend  Participation Quality:  Did not attend  Affect:  Did not attend  Cognitive:  Did not attend  Insight:  Did not attend  Engagement in Group:  Did not attend  Additional Comments:  Did not attend  Earline Mayotte 04/21/2012, 6:32 PM

## 2012-04-21 NOTE — Progress Notes (Addendum)
D:  Patient self inventory sheet,patient has fair sleep, good appetite, normal energy level, improving attention span.  Rated depression and hopelessness #4.  Denied withdrawals.   SI of/on, contracts for safety.  Has felt pain in pat 24 hours.  Pain goal today #2, worst pain #5.  After discharge, plans to "take meds and talk to someone if I feel suicide and get after care."  Will return to apartment in [place] after discharge.  No problems taking meds after discharge. A:  Medications administered per MD order.  Support and encouragement given throughout day.   Support and safety checks completed as ordered. R:  Following treatment plan.  Denied SI and HI.   Denied A/V hallucinations.   Contracts for safety.  Patient remains safe and receptive on unit. Patient and MHT had disagreement over what was said in group this afternoon.   Later patient apologized to MHT for his behavior.

## 2012-04-21 NOTE — Progress Notes (Signed)
Psychoeducational Group Note  Date:  04/21/2012 Time:  8:00PM  Group Topic/Focus:  Wrap-Up Group:   The focus of this group is to help patients review their daily goal of treatment and discuss progress on daily workbooks.  Participation Level:  Minimal  Participation Quality:  Drowsy  Affect:  Appropriate  Cognitive:  Alert and Oriented  Insight:  Developing/Improving  Engagement in Group:  Developing/Improving  Additional Comments:  Pt rated his day as a 4. Pt stated that one coping strategy that he plans to use is to follow up with outpatient therapy and grief counseling.   Taiga Lupinacci, Randal Buba 04/21/2012, 10:43 PM

## 2012-04-21 NOTE — [facility] Suicide Risk Assessment (Signed)
Suicide Risk Assessment  Admission Assessment     Nursing information obtained from:  Patient Demographic factors:  Male;Divorced or widowed;Caucasian;Low socioeconomic status Current Mental Status:  Suicidal ideation indicated by patient;Suicide plan Loss Factors:  Loss of significant relationship;Decline in physical health Historical Factors:  Anniversary of important loss;Impulsivity Risk Reduction Factors:  Sense of responsibility to family;Religious beliefs about death;Employed;Living with another person, especially a relative;Positive social support  CLINICAL FACTORS:   Depression:   Anhedonia Hopelessness Impulsivity  COGNITIVE FEATURES THAT CONTRIBUTE TO RISK:  Cognitively intact  SUICIDE RISK:   Mild:  Suicidal ideation of limited frequency, intensity, duration, and specificity.  There are no identifiable plans, no associated intent, mild dysphoria and related symptoms, good self-control (both objective and subjective assessment), few other risk factors, and identifiable protective factors, including available and accessible social support.  PLAN OF CARE: Initiate medications per medical/neuroscience unit. Encourage attending groups.   Martavia Tye 04/21/2012, 1:16 PM

## 2012-04-21 NOTE — Progress Notes (Signed)
MEDICATION RELATED CONSULT NOTE - FOLLOW UP   Pharmacy Consult for Xarelto    Allergies  Allergen Reactions  . Fish Allergy Anaphylaxis and Rash  . Depakote (Divalproex Sodium) Swelling  . Morphine And Related Hives    Pt tolerated IV morphine 03/25/2012 admission. Pt tolerated PO morphine 03/29/2012.    Patient Measurements: Height: 5\' 8"  (172.7 cm) Weight: 160 lb (72.576 kg) IBW/kg (Calculated) : 68.4   Vital Signs: BP: 119/90 mmHg (12/17 0701) Pulse Rate: 77  (12/17 0701)  Labs: Patient refused blood work  Microbiology: Recent Results (from the past 720 hour(s))  MRSA PCR SCREENING     Status: Normal   Collection Time   03/31/12  3:47 PM      Component Value Range Status Comment   MRSA by PCR NEGATIVE  NEGATIVE Final     Medications:  Scheduled:    . docusate sodium  100 mg Oral BID  . folic acid  1 mg Oral Daily  . lacosamide  300 mg Oral BID  . levETIRAcetam  2,000 mg Oral BID  . LORazepam  0.5 mg Oral BID  . phenobarbital  64.8 mg Oral QHS  . phenytoin  200 mg Oral BID  . rivaroxaban  20 mg Oral Q supper  . sertraline  75 mg Oral Daily  . thiamine  100 mg Oral Daily  . topiramate  100 mg Oral BID    Assessment: Patient refused blood work.  Will assess H/H and crea when available.  Patient on dilantin, no level obtained as it was refused.   Plan:  Order CBC, BMET and dilantin level am labs 04/22/12  Charyl Dancer 04/21/2012,9:21 AM
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
## 2012-04-22 DIAGNOSIS — F1910 Other psychoactive substance abuse, uncomplicated: Secondary | ICD-10-CM

## 2012-04-22 DIAGNOSIS — F1994 Other psychoactive substance use, unspecified with psychoactive substance-induced mood disorder: Secondary | ICD-10-CM

## 2012-04-22 DIAGNOSIS — F341 Dysthymic disorder: Secondary | ICD-10-CM

## 2012-04-22 NOTE — Social Work (Signed)
Interdisciplinary Treatment Plan Update (Adult)  Date:  04/22/2012  Time Reviewed:  7:09 AM    Progress in Treatment: Attending groups:   Yes   Participating in groups:  Not today Taking medication as prescribed:  Yes Tolerating medication:  Yes Family/Significant othe contact made: No Patient understands diagnosis:  Yes Discussing patient identified problems/goals with staff: No Medical problems stabilized or resolved: No Denies suicidal/homicidal ideation: No -  Patient able to contract for safety Issues/concerns per patient self-inventory:  Other:  New problem(s) identified:  Reason for Continuation of Hospitalization: Anxiety Depression Medication stabilization Suicidal ideation  Interventions implemented related to continuation of hospitalization:  Medication mgement; safety checks q 15 mins; coping skills development  Additional comments: Patient ranks himself in 8 for depression and has suicidal ideations. Patient reports he is missing his deceased daughter whose birthday is this month and while denying any anxiety and discharge planning group, patient has been reporting to staff that he is extremely anxious and has been Stage manager and demanding of staff. Patient has substance abuse issues though he denies this.  Estimated length of stay: 3-5 days  Discharge Plan:  Outpatient follow up to be scheduled  New goal(s):  Review of initial/current patient goals per problem list:    1.  Goal(s): Eliminate SI/other thoughts of self harm   Met:  No  Target date: d/c  As evidenced by: Patient will no longer endorse SI/HI or other thoughts of self harm.    2.  Goal (s):Reduce depression/anxiety  Met: No  Target date: d/c  As evidenced by: Patient will rate symptoms at four or below    3.  Goal(s):.stabilize on meds   Met:  No  Target date: d/c  As evidenced by: Patient will report being stabilized on medications - less symptomatic    4.   Goal(s): Refer for outpatient follow up   Met:  No  Target date: d/c  As evidenced by: Follow up appointment will be scheduled    Attendees: Patient:   @TD  7:09 AM  Physican:  Patrick North, MD @TD  7:09 AM  Nursing:  Berneice Heinrich, RN  04/22/2012 7:09 AM  Nursing:    @TD  7:09 AM  Clinical Social Worker:  Patton Salles, LCSW @TD  7:09 AM  Other Oswald Hillock Lord,PMH-NP 04/22/2012 7:09 AM   Other:         04/22/2012 7:09 AM Other:

## 2012-04-22 NOTE — Progress Notes (Signed)
Reviewed

## 2012-04-22 NOTE — Progress Notes (Signed)
Pt reports nicotine cravings.Pt administered a nicotine gum. Pt nicotine cravings decreased.

## 2012-04-22 NOTE — Progress Notes (Signed)
[facility] MD Progress Note  04/22/2012 11:52 AM [name] [name]  MRN:  [id number] Subjective:  8/10 depression, 4-5/10 anxiety Diagnosis:   Axis I: Generalized Anxiety Disorder Axis II: Deferred Axis III:  Past Medical History  Diagnosis Date  . Seizure   . Protein C deficiency   . Protein C deficiency    Axis IV: other psychosocial or environmental problems, problems related to social environment and problems with primary support group Axis V: 41-50 serious symptoms  ADL's:  Intact  Sleep: Fair  Appetite:  Fair  Suicidal Ideation:  "Off and on", passive, contracts for safety Homicidal Ideation:  Denies  Psychiatric Specialty Exam: Review of Systems  Constitutional: Negative.   HENT: Negative.   Eyes: Negative.   Respiratory: Negative.   Cardiovascular: Negative.   Gastrointestinal: Negative.   Genitourinary: Negative.   Musculoskeletal: Negative.   Skin: Negative.   Neurological: Negative.   Endo/Heme/Allergies: Negative.   Psychiatric/Behavioral: Positive for depression and suicidal ideas. The patient is nervous/anxious.     Blood pressure 111/77, pulse 72, temperature 97.8 F (36.6 C), temperature source Oral, resp. rate 16, height 5\' 8"  (1.727 m), weight 72.576 kg (160 lb).Body mass index is 24.33 kg/(m^2).  General Appearance: Casual  Eye Contact::  Fair  Speech:  Normal Rate  Volume:  Decreased  Mood:  Depressed  Affect:  Congruent  Thought Process:  Coherent  Orientation:  Full (Time, Place, and Person)  Thought Content:  WDL  Suicidal Thoughts:  Yes.  without intent/plan  Homicidal Thoughts:  No  Memory:  Immediate;   Fair Recent;   Fair Remote;   Fair  Judgement:  Fair  Insight:  Fair  Psychomotor Activity:  Decreased  Concentration:  Fair  Recall:  Fair  Akathisia:  No  Handed:  Right  AIMS (if indicated):     Assets:  Housing Resilience Social Support Vocational/Educational  Sleep:  Number of Hours: 5.75    Current Medications: Current  Facility-Administered Medications  Medication Dose Route Frequency Provider Last Rate Last Dose  . acetaminophen (TYLENOL) tablet 650 mg  650 mg Oral Q6H PRN Rachael Fee, MD      . alum & mag hydroxide-simeth (MAALOX/MYLANTA) 200-200-20 MG/5ML suspension 30 mL  30 mL Oral Q4H PRN Rachael Fee, MD      . docusate sodium (COLACE) capsule 100 mg  100 mg Oral BID Rachael Fee, MD      . folic acid (FOLVITE) tablet 1 mg  1 mg Oral Daily Rachael Fee, MD   1 mg at 04/22/12 [phone]  . lacosamide (VIMPAT) tablet 300 mg  300 mg Oral BID Rachael Fee, MD   300 mg at 04/22/12 4401  . levETIRAcetam (KEPPRA) tablet 2,000 mg  2,000 mg Oral BID Rachael Fee, MD   2,000 mg at 04/22/12 0272  . LORazepam (ATIVAN) tablet 0.5 mg  0.5 mg Oral BID Rachael Fee, MD   0.5 mg at 04/22/12 5366  . magnesium hydroxide (MILK OF MAGNESIA) suspension 30 mL  30 mL Oral Daily PRN Rachael Fee, MD      . morphine (MSIR) tablet 30 mg  30 mg Oral Q8H PRN Rachael Fee, MD   30 mg at 04/22/12 0644  . nicotine polacrilex (NICORETTE) gum 2 mg  2 mg Oral PRN Rachael Fee, MD   2 mg at 04/22/12 1109  . PHENobarbital (LUMINAL) tablet 64.8 mg  64.8 mg Oral QHS Rachael Fee, MD   64.8 mg  at 04/21/12 2217  . phenytoin (DILANTIN) ER capsule 200 mg  200 mg Oral BID Rachael Fee, MD   200 mg at 04/22/12 1610  . Rivaroxaban (XARELTO) tablet 20 mg  20 mg Oral Q supper Rachael Fee, MD   20 mg at 04/21/12 1633  . sertraline (ZOLOFT) tablet 75 mg  75 mg Oral Daily Rachael Fee, MD   75 mg at 04/22/12 9604  . thiamine (VITAMIN B-1) tablet 100 mg  100 mg Oral Daily Rachael Fee, MD   100 mg at 04/22/12 5409  . topiramate (TOPAMAX) tablet 100 mg  100 mg Oral BID Rachael Fee, MD   100 mg at 04/22/12 8119    Lab Results: No results found for this or any previous visit (from the past 48 hour(s)).  Physical Findings: AIMS: Facial and Oral Movements Muscles of Facial Expression: None, normal Lips and Perioral Area: None, normal Jaw:  None, normal Tongue: None, normal,Extremity Movements Upper (arms, wrists, hands, fingers): None, normal Lower (legs, knees, ankles, toes): None, normal, Trunk Movements Neck, shoulders, hips: None, normal, Overall Severity Severity of abnormal movements (highest score from questions above): None, normal Incapacitation due to abnormal movements: None, normal Patient's awareness of abnormal movements (rate only patient's report): No Awareness, Dental Status Current problems with teeth and/or dentures?: No Does patient usually wear dentures?: No  CIWA:  CIWA-Ar Total: 0  COWS:  COWS Total Score: 0   Treatment Plan Summary: Daily contact with patient to assess and evaluate symptoms and progress in treatment Medication management  Plan:  Review of chart, vital signs, medications, and notes. 1-Individual and group therapy 2-Medication management for depression and anxiety 3-Grief therapy for the loss of his 43 yo daughter 4-Discharge plan to prevent relapse  5-Supportive environment to optimize care  Medical Decision Making Problem Points:  Established problem, stable/improving (1) and Review of psycho-social stressors (1) Data Points:  Review of medication regiment & side effects (2)  I certify that inpatient services furnished can reasonably be expected to improve the patient's condition.   [name], PMH-NP 04/22/2012, 11:52 AM

## 2012-04-22 NOTE — H&P (Signed)
Patient seen and assessed. Agree with key elements of above H&p.

## 2012-04-22 NOTE — Progress Notes (Signed)
[facility] Group Notes:  (Counselor/Nursing/MHT/Case Management/Adjunct)  Type of Therapy:  Psychoeducational Skills  Participation Level:  None  Participation Quality:  Drowsy  Affect:  Blunted and Lethargic  Cognitive:  Oriented  Insight:  Lacking  Engagement in Group:  Lacking  Engagement in Therapy:  n/a  Modes of Intervention:  Activity, Discussion, Education, Limit-setting, Problem-solving, Rapport Building, Socialization and Support  Summary of Progress/Problems: Arvo came in at end but did attend psychoeducational group that focused on using quality time with support systems/individuals to engage in health coping skills. Jashaun participated in activity guessing about self and peers. Chaunce was slumped in chair with eyes closed but spoke when prompted while group discussed who their support systems are, how they can spend positive quality time with them as a coping skills and a way to strengthen their relationship. Jakori was given a homework assignment to find two ways to improve his support systems and twenty activities he can do to spend quality time with h* supports.   Wandra Scot 04/22/2012 3:49 PM

## 2012-04-22 NOTE — Progress Notes (Signed)
  D) Patient irritable upon my assessment. Patient demanding and attention seeking during morning medication pass. Patient completed Patient Self Inventory, reports slept "fair," and  appetite is "poor." Patient rates depression as   8/10, patient rates hopeless feelings as  4/10. Patient endorses "off and on" SI contracts verbally with staff for safety. Patient denies HI, denies A/V hallucinations.   A) Patient offered support and encouragement, patient encouraged to discuss feelings/concerns with staff. Patient verbalized understanding. Patient monitored Q15 minutes for safety. Patient met with MD  to discuss today's goals and plan of care.  R) Patient visible in milieu, attending groups in day room and meals in dining room. Patient appropriate with peers.   Patient taking medications as ordered. Will continue to monitor.

## 2012-04-22 NOTE — Social Work (Signed)
[facility] LCSW Aftercare Discharge Planning Group Note  04/22/2012  8:45 AM  Participation Quality:  Attentive and Resistant  Affect:  Blunted, Depressed, Flat and Resistant  Cognitive:  Oriented  Insight:  Limited  Engagement in Group:  Resistant  Modes of Intervention:  Discussion, Education and Support  Summary of Progress/Problems: Pt attended discharge planning group and minimally participated in group.  CSW provided pt with today's workbook.  Patient reports he is extremely depressed today with his level being at an 8. Patient reports he is missing his deceased daughter whose birthday is this month and while denying having any anxiety in group, reports to staff that he is very anxious.   [facility] LCSW Group Therapy  04/22/2012 1:15 PM  Type of Therapy:  Group Therapy  Participation Level:  Minimal  Participation Quality:  Drowsy and Sharing  Affect:  Flat and Irritable  Cognitive:  Appropriate  Insight:  Engaged  Engagement in Therapy:  Lacking  Modes of Intervention:  Discussion, Education, Role-play, Socialization and Support  Summary of Progress/Problems: Patient participated minimally in group session focused on emotional regulation. Patient use group time to complain about a staff member he felt was rude to him when he asked for nicotine gum and was told he had to wait 30 minutes. Role played with patient how he might of handled situation differently the patient was not receptive to what was being said. Patient was also very drowsy during group and had difficulty focusing.   Patton Salles LCSW 04/22/2012 7:08 AM

## 2012-04-23 NOTE — Social Work (Signed)
Aftercare Planning Group: 04/23/2012 9:45 AM  Pt attended discharge planning group and actively participated in group.  CSW provided pt with today's workbook.  Pt presents with a 5 for depression and is 0 for anxiety, helplessness, and hopelessness. Patient denies having any suicidal or homicidal ideations and says he feels better today. Followup appointments have been scheduled. Patton Salles, LCSW 04/23/2012 9:45 AM

## 2012-04-23 NOTE — Progress Notes (Signed)
[facility] INPATIENT:  Family/Significant Other Suicide Prevention Education  Suicide Prevention Education:  Patient Refusal for Family/Significant Other Suicide Prevention Education: The patient [name] [name] has refused to provide written consent for family/significant other to be provided Family/Significant Other Suicide Prevention Education during admission and/or prior to discharge.  Physician notified.  Patton Salles 04/23/2012, 7:56 AM

## 2012-04-23 NOTE — Progress Notes (Signed)
Psychoeducational Group Note  Date:  04/23/2012 Time: 2015  Group Topic/Focus:  Wrap-Up Group:   The focus of this group is to help patients review their daily goal of treatment and discuss progress on daily workbooks.  Participation Level:  Minimal  Participation Quality:  Inattentive  Affect:  Excited  Cognitive:  Disorganized  Insight:  Distracting  Engagement in Group:  Distracting  Additional Comments:  Patient distracting to other patients during group time. Patient shred that he was depressed this morning but has improved a bit since the day has progressed.  Kathy Wahid, Newton Pigg 04/23/2012, 1:19 AM

## 2012-04-23 NOTE — Progress Notes (Signed)
Psychoeducational Group Note  Date:  04/23/2012 Time:  1000  Group Topic/Focus:  Self Esteem Action Plan:   The focus of this group is to help patients create a plan to continue to build self-esteem after discharge.  Participation Level:  Active  Participation Quality:  Appropriate, Attentive, Redirectable and Sharing  Affect:  Appropriate  Cognitive:  Appropriate  Insight:  Developing/Improving  Engagement in Group:  Developing/Improving  Additional Comments:  Hyde attended group and shared. Patient was asked to define self-esteem. Afterwards patient was asked what their self esteem level was like and if it was high or low self-esteem. Patient was asked to complete self esteem action plan. Patient completed and answered among the group on what ways to increase self- esteem and activities to complete self esteem. Patient discussed what were difficult and easy ways to complete the workbook.    Karleen Hampshire Brittini 04/23/2012, 11:16 AM

## 2012-04-23 NOTE — Progress Notes (Signed)
Patient ID: [name] [name], male   DOB: August 17, 1980, 31 y.o.   MRN: [id number] D: Patient out in hallway on approach. Pt presented with anxious mood and flat affect. Pt attended evening wrap up group and interacted appropriately with peers. Pt stated during group that he was angry and irritable this morning but  is in better mood now. Pt denies SI/HI/AV.  No acute distressed noted. Cooperative with assessment.   A: Met with pt 1:1. Medications administered as prescribed. Pt encouraged to come to staff with any question or concerns. Safety has been maintained with Q15 minutes observation.   R: Patient remains safe. He is complaint with medications and group programming. Safety has been maintained Q15 and continue current POC.

## 2012-04-23 NOTE — Progress Notes (Signed)
  D) Patient cooperative but can be demanding upon my assessment. Patient did not complete Patient Self Inventory. Patient denies SI/HI, denies A/V hallucinations.   A) Patient offered support and encouragement, patient encouraged to discuss feelings/concerns with staff. Patient verbalized understanding. Patient monitored Q15 minutes for safety. Patient met with MD  to discuss today's goals and plan of care.  R) Patient visible in milieu, attending groups in day room and meals in dining room. Patient mostlycooperative with staff and peers.   Patient taking medications as ordered. Will continue to monitor.

## 2012-04-23 NOTE — Progress Notes (Signed)
Psychoeducational Group Note  Date:  04/23/2012 Time:  1100  Group Topic/Focus:  Healthy Communication:   The focus of this group is to discuss communication, barriers to communication, as well as healthy ways to communicate with others.  Participation Level:  Active  Participation Quality:  Attentive, Intrusive, Monopolizing, Redirectable and Sharing  Affect:  Anxious and Irritable  Cognitive:  Alert and Oriented  Insight:  Monopolizing  Engagement in Group:  Monopolizing  Additional Comments:    [name] 04/23/2012, 12:14 PM

## 2012-04-23 NOTE — Clinical Social Work Note (Signed)
[facility] LCSW Group Therapy            04/23/2012 4:02 PM    Type of Therapy:  Group Therapy  Participation Level:  Appropriate  Participation Quality:  Appropriate  Affect:  Appropriate  Cognitive:  Attentive Appropriate  Insight:  Engaged  Engagement in Therapy:  Engaged  Modes of Intervention:  Discussion Exploration Problem-Solving Supportive Education  Summary of Progress/Problems: Patient listen attentively to speaker from [facility].  He advised a balanced life for him is keeping a steady job and doing positive things on the weekend.  Wynn Banker 04/23/2012 4:02 PM

## 2012-04-23 NOTE — Progress Notes (Signed)
[facility] MD Progress Note  04/23/2012 8:07 AM [name] [name]  MRN:  [id number] Subjective:  6/10 depression Diagnosis:   Axis I: Major Depression, Recurrent severe Axis II: Deferred Axis III:  Past Medical History  Diagnosis Date  . Seizure   . Protein C deficiency   . Protein C deficiency    Axis IV: other psychosocial or environmental problems, problems related to social environment and problems with primary support group Axis V: 41-50 serious symptoms  ADL's:  Intact  Sleep: Good  Appetite:  Good  Suicidal Ideation:  Denies Homicidal Ideation:  Denies  Psychiatric Specialty Exam: Review of Systems  Constitutional: Negative.   HENT: Negative.   Eyes: Negative.   Respiratory: Negative.   Cardiovascular: Negative.   Gastrointestinal: Negative.   Genitourinary: Negative.   Musculoskeletal: Negative.   Skin: Negative.   Neurological: Negative.   Endo/Heme/Allergies: Negative.   Psychiatric/Behavioral: Positive for depression.    Blood pressure 145/83, pulse 77, temperature 97.8 F (36.6 C), temperature source Oral, resp. rate 16, height 5\' 8"  (1.727 m), weight 72.576 kg (160 lb).Body mass index is 24.33 kg/(m^2).  General Appearance: Casual  Eye Contact::  Good  Speech:  Normal Rate  Volume:  Normal  Mood:  Depressed  Affect:  Congruent  Thought Process:  Coherent  Orientation:  Full (Time, Place, and Person)  Thought Content:  WDL  Suicidal Thoughts:  No  Homicidal Thoughts:  No  Memory:  Immediate;   Fair Recent;   Fair Remote;   Fair  Judgement:  Fair  Insight:  Fair  Psychomotor Activity:  Normal  Concentration:  Fair  Recall:  Fair  Akathisia:  No  Handed:  Right  AIMS (if indicated):     Assets:  Communication Skills Desire for Improvement Resilience Social Support  Sleep:  Number of Hours: 6.75    Current Medications: Current Facility-Administered Medications  Medication Dose Route Frequency Provider Last Rate Last Dose  . acetaminophen  (TYLENOL) tablet 650 mg  650 mg Oral Q6H PRN Rachael Fee, MD      . alum & mag hydroxide-simeth (MAALOX/MYLANTA) 200-200-20 MG/5ML suspension 30 mL  30 mL Oral Q4H PRN Rachael Fee, MD      . docusate sodium (COLACE) capsule 100 mg  100 mg Oral BID Rachael Fee, MD      . folic acid (FOLVITE) tablet 1 mg  1 mg Oral Daily Rachael Fee, MD   1 mg at 04/22/12 [phone]  . lacosamide (VIMPAT) tablet 300 mg  300 mg Oral BID Rachael Fee, MD   300 mg at 04/22/12 1659  . levETIRAcetam (KEPPRA) tablet 2,000 mg  2,000 mg Oral BID Rachael Fee, MD   2,000 mg at 04/22/12 1700  . LORazepam (ATIVAN) tablet 0.5 mg  0.5 mg Oral BID Rachael Fee, MD   0.5 mg at 04/22/12 1701  . magnesium hydroxide (MILK OF MAGNESIA) suspension 30 mL  30 mL Oral Daily PRN Rachael Fee, MD      . morphine (MSIR) tablet 30 mg  30 mg Oral Q8H PRN Rachael Fee, MD   30 mg at 04/23/12 0646  . nicotine polacrilex (NICORETTE) gum 2 mg  2 mg Oral PRN Rachael Fee, MD   2 mg at 04/23/12 0640  . PHENobarbital (LUMINAL) tablet 64.8 mg  64.8 mg Oral QHS Rachael Fee, MD   64.8 mg at 04/22/12 2141  . phenytoin (DILANTIN) ER capsule 200 mg  200 mg Oral  BID Rachael Fee, MD   200 mg at 04/22/12 1700  . Rivaroxaban (XARELTO) tablet 20 mg  20 mg Oral Q supper Rachael Fee, MD   20 mg at 04/22/12 1700  . sertraline (ZOLOFT) tablet 75 mg  75 mg Oral Daily Rachael Fee, MD   75 mg at 04/22/12 1610  . thiamine (VITAMIN B-1) tablet 100 mg  100 mg Oral Daily Rachael Fee, MD   100 mg at 04/22/12 9604  . topiramate (TOPAMAX) tablet 100 mg  100 mg Oral BID Rachael Fee, MD   100 mg at 04/22/12 1700    Lab Results: No results found for this or any previous visit (from the past 48 hour(s)).  Physical Findings: AIMS: Facial and Oral Movements Muscles of Facial Expression: None, normal Lips and Perioral Area: None, normal Jaw: None, normal Tongue: None, normal,Extremity Movements Upper (arms, wrists, hands, fingers): None, normal Lower  (legs, knees, ankles, toes): None, normal, Trunk Movements Neck, shoulders, hips: None, normal, Overall Severity Severity of abnormal movements (highest score from questions above): None, normal Incapacitation due to abnormal movements: None, normal Patient's awareness of abnormal movements (rate only patient's report): No Awareness, Dental Status Current problems with teeth and/or dentures?: No Does patient usually wear dentures?: No  CIWA:  CIWA-Ar Total: 0  COWS:  COWS Total Score: 0   Treatment Plan Summary: Daily contact with patient to assess and evaluate symptoms and progress in treatment Medication management  Plan:  Review of chart, medications, notes, and vital signs. 1-Individual and group therapy 2-Medication management for depression, seizures are under control 3-Discharge plan to prevent relapse 4-Coping skill development for anger management, depression--discussed ways to deal with conflict, walk away--calm down, discuss 5-Grief management for the death of his 68 yo daughter--discussed packing up her room, making a grief memory book  Medical Decision Making Problem Points:  Established problem, stable/improving (1) and Review of psycho-social stressors (1) Data Points:  Review of medication regiment & side effects (2)  I certify that inpatient services furnished can reasonably be expected to improve the patient's condition.   [name], PMH-NP 04/23/2012, 8:07 AM

## 2012-04-24 NOTE — Progress Notes (Signed)
gPsychoeducational Group Note  Date:  04/24/2012 Time:  2000  Group Topic/Focus:  Karaoke night   Participation Level:  Minimal  Participation Quality:  Appropriate  Affect:  Appropriate  Cognitive:  Appropriate  Insight:  Engaged  Engagement in Group:  Engaged  Additional Comments:    Creston Klas A 04/24/2012, 3:51 AM

## 2012-04-24 NOTE — Progress Notes (Signed)
[facility] LCSW Aftercare Discharge Planning Group Note  04/24/2012 2:45 PM  Participation Quality:  Attentive, Intrusive and Monopolizing  Affect:  Anxious, Appropriate and Blunted  Cognitive:  Alert and Appropriate  Insight:  Improving  Engagement in Group:  Distracting  Modes of Intervention:  Discussion and Exploration  Summary of Progress/Problems:  [name] [name] attended group this morning and  Received his daily workbook/activities.  He reports he does not have any anxiety or depression rated both at zero. He reports he is happy he is out of the hospital and medications are working and he is stable. He reports he is from [place] and will have transportation from his step dad. He wants to go to some sort of grief counseling to work through issues.  Patient to follow up with [facility] to establish services.  No other needs or problems identified.   \ Nail, [name] [name] 04/24/2012, 2:45 PM

## 2012-04-24 NOTE — Progress Notes (Signed)
Reviewed

## 2012-04-24 NOTE — Progress Notes (Signed)
Psychoeducational Group Note  Date:  04/24/2012 Time:  2000  Group Topic/Focus:  Goals Group:   The focus of this group is to help patients establish daily goals to achieve during treatment and discuss how the patient can incorporate goal setting into their daily lives to aide in recovery.  Participation Level:  Active  Participation Quality:  Appropriate and Drowsy  Affect:  Appropriate  Cognitive:  Appropriate  Insight:  Developing/Improving  Engagement in Group:  Developing/Improving  Additional Comments:  Patient stated that she had a good day. Patient shared that he got along with everyone, socialized, and went to all the groups. From the groups, patient has learned that he "need to go to grief counseling" upon discharge. Patient hopes to get in better spirits and learn more positive coping skills.   Lyndee Hensen 04/24/2012, 10:19 PM

## 2012-04-24 NOTE — Progress Notes (Signed)
Psychoeducational Group Note  Date:  04/24/2012 Time:  1100  Group Topic/Focus:  Early Warning Signs:   The focus of this group is to help patients identify signs or symptoms they exhibit before slipping into an unhealthy state or crisis.  Participation Level: Did Not Attend  Participation Quality:  Not Applicable  Affect:  Not Applicable  Cognitive:  Not Applicable  Insight:  Not Applicable  Engagement in Group: Not Applicable  Additional Comments:  Pt refused to attend group this morning.  Draven Laine E 04/24/2012, 1:37 PM

## 2012-04-24 NOTE — Clinical Social Work Note (Signed)
[facility] LCSW Group Therapy  04/24/2012 6:43 PM  Type of Therapy:  Group Therapy  Participation Level:  Active  Participation Quality:  Attentive and Sharing  Affect:  Depressed and Flat  Cognitive:  Oriented  Insight:  Limited  Engagement in Therapy:  Engaged  Modes of Intervention:  Exploration, Limit-setting and Support  Summary of Progress/Problems:  Focus of group therapy session was to identify what balance would look like for individual patients after discharge.  Patients choose from group of photographs a photo that had meaning for them in reference to balance. Joanne chose a Building services engineer of the coastline to represent how he "feels at peace and alive when at the beach, especially when I get laid." Comment caused some reactions, and pt apologized saying I have to tell it like it is.  Valdemar was supportive of another patient who shared for the first time.   Clide Dales 04/24/2012, 6:19 PM

## 2012-04-24 NOTE — Progress Notes (Signed)
Patient ID: [name] [name], male   DOB: [birth year], 29 y.o.   MRN: [id number] D: Patient in dayroom on approach. Pt presented with depressed mood. Pt  attended evening wrap up group and interacted appropriately with peers. Pt shared " don't want to get angry, still in pain from lost of child". Pt goal was to seek grieve counseling after discharged. Pt stated "leave in a better spirit than I came in".  Pt continues to ask for nicotine gum hourly. Denies SI/HI/AV. Cooperative with assessment. No acute distressed noted.   A: Met with pt 1:1. Medications administered as prescribed. Pt encouraged to come to staff with any question or concerns. Safety has been maintained with Q15 minutes observation.   R: Patient remains safe. He is complaint with medications and group programming. Safety has been maintained Q15 and continue current POC.

## 2012-04-24 NOTE — Progress Notes (Signed)
[facility] MD Progress Note  04/24/2012 9:23 AM [name] [name]  MRN:  [id number] Subjective:  8/10 depression, rumination over loss of daughter Diagnosis:   Axis I: Major Depression, Recurrent severe; PTSD Axis II: Deferred Axis III:  Past Medical History  Diagnosis Date  . Seizure   . Protein C deficiency   . Protein C deficiency    Axis IV: other psychosocial or environmental problems, problems related to social environment and problems with primary support group Axis V: 41-50 serious symptoms  ADL's:  Intact  Sleep: Good  Appetite:  Fair  Suicidal Ideation:  Denies Homicidal Ideation:  Denies  Psychiatric Specialty Exam: Review of Systems  Constitutional: Negative.   HENT: Negative.   Eyes: Negative.   Respiratory: Negative.   Cardiovascular: Negative.   Gastrointestinal: Negative.   Genitourinary: Negative.   Musculoskeletal: Negative.   Skin: Negative.   Neurological: Negative.   Endo/Heme/Allergies: Negative.   Psychiatric/Behavioral: Positive for depression.    Blood pressure 118/73, pulse 75, temperature 98.4 F (36.9 C), temperature source Oral, resp. rate 16, height 5\' 8"  (1.727 m), weight 72.576 kg (160 lb).Body mass index is 24.33 kg/(m^2).  General Appearance: Casual  Eye Contact::  Fair  Speech:  Normal Rate  Volume:  Normal  Mood:  Depressed  Affect:  Congruent  Thought Process:  Coherent  Orientation:  Full (Time, Place, and Person)  Thought Content:  WDL  Suicidal Thoughts:  No  Homicidal Thoughts:  No  Memory:  Immediate;   Fair Recent;   Fair Remote;   Fair  Judgement:  Fair  Insight:  Fair  Psychomotor Activity:  Decreased  Concentration:  Fair  Recall:  Fair  Akathisia:  No  Handed:  Right  AIMS (if indicated):     Assets:  Communication Skills Desire for Improvement Social Support  Sleep:  Number of Hours: 6.75    Current Medications: Current Facility-Administered Medications  Medication Dose Route Frequency Provider Last  Rate Last Dose  . acetaminophen (TYLENOL) tablet 650 mg  650 mg Oral Q6H PRN Rachael Fee, MD      . alum & mag hydroxide-simeth (MAALOX/MYLANTA) 200-200-20 MG/5ML suspension 30 mL  30 mL Oral Q4H PRN Rachael Fee, MD      . docusate sodium (COLACE) capsule 100 mg  100 mg Oral BID Rachael Fee, MD   100 mg at 04/24/12 4098  . folic acid (FOLVITE) tablet 1 mg  1 mg Oral Daily Rachael Fee, MD   1 mg at 04/24/12 1191  . lacosamide (VIMPAT) tablet 300 mg  300 mg Oral BID Rachael Fee, MD   300 mg at 04/24/12 0820  . levETIRAcetam (KEPPRA) tablet 2,000 mg  2,000 mg Oral BID Rachael Fee, MD   2,000 mg at 04/24/12 4782  . LORazepam (ATIVAN) tablet 0.5 mg  0.5 mg Oral BID Rachael Fee, MD   0.5 mg at 04/24/12 9562  . magnesium hydroxide (MILK OF MAGNESIA) suspension 30 mL  30 mL Oral Daily PRN Rachael Fee, MD      . morphine (MSIR) tablet 30 mg  30 mg Oral Q8H PRN Rachael Fee, MD   30 mg at 04/24/12 1308  . nicotine polacrilex (NICORETTE) gum 2 mg  2 mg Oral PRN Rachael Fee, MD   2 mg at 04/24/12 0820  . PHENobarbital (LUMINAL) tablet 64.8 mg  64.8 mg Oral QHS Rachael Fee, MD   64.8 mg at 04/23/12 2140  . phenytoin (DILANTIN)  ER capsule 200 mg  200 mg Oral BID Rachael Fee, MD   200 mg at 04/24/12 1610  . Rivaroxaban (XARELTO) tablet 20 mg  20 mg Oral Q supper Rachael Fee, MD   20 mg at 04/23/12 1715  . sertraline (ZOLOFT) tablet 75 mg  75 mg Oral Daily Rachael Fee, MD   75 mg at 04/24/12 9604  . thiamine (VITAMIN B-1) tablet 100 mg  100 mg Oral Daily Rachael Fee, MD   100 mg at 04/24/12 5409  . topiramate (TOPAMAX) tablet 100 mg  100 mg Oral BID Rachael Fee, MD   100 mg at 04/24/12 8119    Lab Results: No results found for this or any previous visit (from the past 48 hour(s)).  Physical Findings: AIMS: Facial and Oral Movements Muscles of Facial Expression: None, normal Lips and Perioral Area: None, normal Jaw: None, normal Tongue: None, normal,Extremity Movements Upper  (arms, wrists, hands, fingers): None, normal Lower (legs, knees, ankles, toes): None, normal, Trunk Movements Neck, shoulders, hips: None, normal, Overall Severity Severity of abnormal movements (highest score from questions above): None, normal Incapacitation due to abnormal movements: None, normal Patient's awareness of abnormal movements (rate only patient's report): No Awareness, Dental Status Current problems with teeth and/or dentures?: No Does patient usually wear dentures?: No  CIWA:  CIWA-Ar Total: 0  COWS:  COWS Total Score: 0   Treatment Plan Summary: Daily contact with patient to assess and evaluate symptoms and progress in treatment Medication management  Plan:  Review of chart, medications, notes, and vital signs. 1-Individual and group therapy 2-Medication review for depression and seizure disorder 3-Coping skills for anger management--patient did get upset with staff yesterday, used his coping skills and walked away--calmed down--then, discussed the issue with staff calmly and resolved the issue.  Patient was excited that he had managed this feat and was encouraged to continue using his coping skills.  We discussed coping skills for depression when he returns home--using his support system, exercise,... 4-Supportive environment to optimize his care 5-Client is currently taking notes from things he learns in group, encouraged him to keep them readily available at home 6-Discharge plan in progress to prevent relapse and promote self-care  Medical Decision Making Problem Points:  Established problem, stable/improving (1) and Review of psycho-social stressors (1) Data Points:  Review of medication regiment & side effects (2)  I certify that inpatient services furnished can reasonably be expected to improve the patient's condition.   [name], PMH-NP 04/24/2012, 9:23 AM

## 2012-04-24 NOTE — Progress Notes (Signed)
Patient ID: [name] [name], male   DOB: [birth year], 31 y.o.   MRN: [id number] D:Pt presented with depressed mood. Pt was agitated and irritable during karaoke. Write and tech were able to redirect pt. Pt remained angry through the evening. No aggressive behavior noted.    Denies SI/HI/AV. Cooperative with assessment. No acute distressed noted.   A: Medications administered as prescribed. Pt encouraged to come to staff with any question or concerns. Safety has been maintained with Q15 minutes observation.   R: Patient remains safe. He is complaint with medications and group programming. Safety has been maintained Q15 and continue current POC.

## 2012-04-24 NOTE — Progress Notes (Signed)
Psychoeducational Group Note  Psychoeducational Group Note  Date:  04/24/2012 Time:  1000  Group Topic/Focus:  Wellness Toolbox:   The focus of this group is to discuss various aspects of wellness, balancing those aspects and exploring ways to increase the ability to experience wellness.  Patients will create a wellness toolbox for use upon discharge.  Participation Level:  Active  Participation Quality:  Appropriate, Sharing and Supportive  Affect:  Appropriate  Cognitive:  Appropriate  Insight:  Supportive  Engagement in Group:  Supportive  Additional Comments:  none  Gwendy Boeder M 04/24/2012, 2:02 PM  

## 2012-04-24 NOTE — Progress Notes (Addendum)
D) Pt has been attending the groups and interacting with his peers. Pt attempts to joke with his peers and the staff, but is unaware of how he is received by others. Will get upset and angry when people do not respond in a joking matter back to him. Affect is flat and mood is depressed. Is immature in his interactions and responses and has little insight into his behavior. Pt asks for nicotine gum frequently throughout the shift. A) Pt given support and attempts have been made at developing a therapeutic alliance with the Pt. Given praise when appropriate. R) Denies SI and HI. Pt rates his depression at a 6, hopelessness at a 5 and states that he is having SI on and off. Verbalizes sadness over the loss of his child, her birthday that is in December and that Christmas is coming and she is not here.

## 2012-04-25 DIAGNOSIS — F4310 Post-traumatic stress disorder, unspecified: Secondary | ICD-10-CM

## 2012-04-25 DIAGNOSIS — F332 Major depressive disorder, recurrent severe without psychotic features: Secondary | ICD-10-CM

## 2012-04-25 NOTE — Progress Notes (Signed)
Goals Group This is a group that identifies the program and helps them to start working on their packets for the day.  Each person sets a goal for the day that is measurable Pt attended this group but fell asleep in it.

## 2012-04-25 NOTE — Progress Notes (Signed)
Group Topic/Focus:  Identifying Needs:   The focus of this group is to help patients identify their personal needs that have been historically problematic and identify healthy behaviors to address their needs.  Participation Level:  Active  Participation Quality:  Appropriate  Affect:  Appropriate  Cognitive:  Appropriate and Disorganized  Insight:  Engaged  Engagement in Group:  Engaged  Additional Comments:    Jadian Karman A   

## 2012-04-25 NOTE — Progress Notes (Signed)
D) Pt has been labile in his mood much of the day. Agitated at times and internalizing when staff would say something or not say something. Verbalizing some paranoid feelings that he was being talked about. States that he gets real angry and that "I'm not usually like this. I am not an asshole, but I am acting like one and it makes me feel real bad afterwards". A) Provided Pt with a 1:1 and explained to Pt why he might be acting this way, and how he is, without even knowing it, covering up his sad, hurt and devastated feelings about the death of his daughter. Explained how easier it is to get angry than to feel the sadness and loneliness he is feeling. Was gently confronted and empowering with the Pt to stop and look at his behavior and hopefully work on recognizing it and changing it R) Pt was able to see the coralation between his anger and his sad feelings and stated that he wants very much to work on his issues, accepting the deaths yet, moving forward in his life.

## 2012-04-25 NOTE — Progress Notes (Signed)
Met with pt 1:1 who remains depressed and anxious with periods of anger/agitation. He continues to request nicorette gum prn frequently. Denies any seizure activity or auras. States he has had suicidal thoughts but none at this time. He does verbally contract for safety with maintained eye contact. Redirected behaviors as needed. Support, encouragement given. Medicated per orders without difficulty. Pt redirects without incident. Denies HI/AVH and is safe. [name] [name]

## 2012-04-25 NOTE — Clinical Social Work Note (Signed)
[facility] Group Notes:  (Clinical Social Work)  04/25/2012   3:00-4:00PM  Summary of Progress/Problems:   The main focus of today's process group was for the patient to identify ways in which they have in the past sabotaged their own recovery and reasons they may have done this/what they received from doing it.  We then worked to identify a specific plan to avoid doing this when discharged from the hospital for this admission.  The patient expressed that he sabotages with denial, telling himself that "I have a job, I have a car, I have my own place, I'm okay."  He stated that this self-sabotaging statement cannot be a lie, because it is true, and CSW pointed out that in fact it is a lie because he stated that the drinking is therefore "no problem."  He acknowledged that this is self-sabotage and identified countering statement to practice.  Type of Therapy:  Group Therapy - Process  Participation Level:  Active  Participation Quality:  Drowsy  Affect:  Blunted  Cognitive:  Oriented  Insight:  Limited  Engagement in Therapy:  Limited  Modes of Intervention:  Clarification, Education, Limit-setting, Problem-solving, Socialization, Support and Processing, Exploration, Discussion   Ambrose Mantle, LCSW 04/25/2012, 4:21 PM

## 2012-04-25 NOTE — Progress Notes (Signed)
[facility] Group Notes:  (Counselor/Nursing/MHT/Case Management/Adjunct)  04/25/2012 11:59 PM  Type of Therapy:  Psychoeducational Skills  Participation Level:  Minimal  Participation Quality:  Inattentive  Affect:  Depressed  Cognitive:  Lacking  Insight:  Improving  Engagement in Group:  Resistant  Engagement in Therapy:  Resistant  Modes of Intervention:  Education  Summary of Progress/Problems: The patient described his day as being a "hard day". He would not elaborate any further on his day. His goal for tomorrow is to try not to allow small things to upset him. He states that he has an "anger problem" and must work on it.    Hazle Coca S 04/25/2012, 11:59 PM

## 2012-04-25 NOTE — Progress Notes (Signed)
[facility] MD Progress Note  04/25/2012 3:11 PM [name] [name]  MRN:  [id number] Subjective: Says he is better 6-7/10  On depression. Looking forward to visitors today.Hasn't had any seizures mood is more stable and he slept pretty well last night.  Diagnosis:   Axis I: Major Depression, Recurrent severe; PTSD Axis II: Deferred Axis III:  Past Medical History  Diagnosis Date  . Seizure   . Protein C deficiency   . Protein C deficiency    Axis IV: other psychosocial or environmental problems, problems related to social environment and problems with primary support group Axis V: 41-50 serious symptoms  ADL's:  Intact  Sleep: Good  Appetite:  Fair  Suicidal Ideation:  Denies Homicidal Ideation:  Denies  Psychiatric Specialty Exam: Review of Systems  Constitutional: Negative.   HENT: Negative.   Eyes: Negative.   Respiratory: Negative.   Cardiovascular: Negative.   Gastrointestinal: Negative.   Genitourinary: Negative.   Musculoskeletal: Negative.   Skin: Negative.   Neurological: Negative.   Endo/Heme/Allergies: Negative.   Psychiatric/Behavioral: Positive for depression.    Blood pressure 118/79, pulse 73, temperature 97.2 F (36.2 C), temperature source Oral, resp. rate 17, height 5\' 8"  (1.727 m), weight 72.576 kg (160 lb).Body mass index is 24.33 kg/(m^2).  General Appearance: Casual  Eye Contact::  Fair  Speech:  Normal Rate  Volume:  Normal  Mood:  Depressed  Affect:  Congruent  Thought Process:  Coherent  Orientation:  Full (Time, Place, and Person)  Thought Content:  WDL  Suicidal Thoughts:  No  Homicidal Thoughts:  No  Memory:  Immediate;   Fair Recent;   Fair Remote;   Fair  Judgement:  Fair  Insight:  Fair  Psychomotor Activity:  Decreased  Concentration:  Fair  Recall:  Fair  Akathisia:  No  Handed:  Right  AIMS (if indicated):     Assets:  Communication Skills Desire for Improvement Social Support  Sleep:  Number of Hours: 5.75    Current  Medications: Current Facility-Administered Medications  Medication Dose Route Frequency Provider Last Rate Last Dose  . acetaminophen (TYLENOL) tablet 650 mg  650 mg Oral Q6H PRN Rachael Fee, MD      . alum & mag hydroxide-simeth (MAALOX/MYLANTA) 200-200-20 MG/5ML suspension 30 mL  30 mL Oral Q4H PRN Rachael Fee, MD      . docusate sodium (COLACE) capsule 100 mg  100 mg Oral BID Rachael Fee, MD   100 mg at 04/25/12 0850  . folic acid (FOLVITE) tablet 1 mg  1 mg Oral Daily Rachael Fee, MD   1 mg at 04/25/12 0850  . lacosamide (VIMPAT) tablet 300 mg  300 mg Oral BID Rachael Fee, MD   300 mg at 04/25/12 0850  . levETIRAcetam (KEPPRA) tablet 2,000 mg  2,000 mg Oral BID Rachael Fee, MD   2,000 mg at 04/25/12 0849  . LORazepam (ATIVAN) tablet 0.5 mg  0.5 mg Oral BID Rachael Fee, MD   0.5 mg at 04/25/12 0850  . magnesium hydroxide (MILK OF MAGNESIA) suspension 30 mL  30 mL Oral Daily PRN Rachael Fee, MD      . morphine (MSIR) tablet 30 mg  30 mg Oral Q8H PRN Rachael Fee, MD   30 mg at 04/25/12 [phone]  . nicotine polacrilex (NICORETTE) gum 2 mg  2 mg Oral PRN Rachael Fee, MD   2 mg at 04/25/12 1038  . PHENobarbital (LUMINAL) tablet 64.8 mg  64.8 mg Oral QHS Rachael Fee, MD   64.8 mg at 04/24/12 2125  . phenytoin (DILANTIN) ER capsule 200 mg  200 mg Oral BID Rachael Fee, MD   200 mg at 04/25/12 0849  . Rivaroxaban (XARELTO) tablet 20 mg  20 mg Oral Q supper Rachael Fee, MD   20 mg at 04/24/12 1726  . sertraline (ZOLOFT) tablet 75 mg  75 mg Oral Daily Rachael Fee, MD   75 mg at 04/25/12 0850  . thiamine (VITAMIN B-1) tablet 100 mg  100 mg Oral Daily Rachael Fee, MD   100 mg at 04/25/12 0850  . topiramate (TOPAMAX) tablet 100 mg  100 mg Oral BID Rachael Fee, MD   100 mg at 04/25/12 1610    Lab Results: No results found for this or any previous visit (from the past 48 hour(s)).  Physical Findings: AIMS: Facial and Oral Movements Muscles of Facial Expression: None,  normal Lips and Perioral Area: None, normal Jaw: None, normal Tongue: None, normal,Extremity Movements Upper (arms, wrists, hands, fingers): None, normal Lower (legs, knees, ankles, toes): None, normal, Trunk Movements Neck, shoulders, hips: None, normal, Overall Severity Severity of abnormal movements (highest score from questions above): None, normal Incapacitation due to abnormal movements: None, normal Patient's awareness of abnormal movements (rate only patient's report): No Awareness, Dental Status Current problems with teeth and/or dentures?: No Does patient usually wear dentures?: No  CIWA:  CIWA-Ar Total: 0  COWS:  COWS Total Score: 0   Treatment Plan Summary: Daily contact with patient to assess and evaluate symptoms and progress in treatment Medication management  Plan:  Review of chart, medications, notes, and vital signs. 1-Individual and group therapy 2-Medication review for depression and seizure disorder 3-Coping skills for anger management--patient did get upset with staff yesterday, used his coping skills and walked away--calmed down--then, discussed the issue with staff calmly and resolved the issue.  Patient was excited that he had managed this feat and was encouraged to continue using his coping skills.  We discussed coping skills for depression when he returns home--using his support system, exercise,... 4-Supportive environment to optimize his care 5-Client is currently taking notes from things he learns in group, encouraged him to keep them readily available at home 6-Discharge plan in progress to prevent relapse and promote self-care  Medical Decision Making Problem Points:  Established problem, stable/improving (1) and Review of psycho-social stressors (1) Data Points:  Review of medication regiment & side effects (2)  I certify that inpatient services furnished can reasonably be expected to improve the patient's condition.   [name] [name],[name] [name].PA-C CAQ-Psych   04/25/2012, 3:11 PM

## 2012-04-26 NOTE — Clinical Social Work Note (Signed)
[facility] Group Notes:  (Clinical Social Work)  04/26/2012   3:00-4:00PM  Summary of Progress/Problems:   Summary of Progress/Problems:   The main focus of today's process group was for the patient to define "support" and describe what healthy supports are, then to identify the patient's current support system and decide on other supports that can be put in place to prevent future hospitalizations.  Roleplay was used to demonstrate definitions of different types of available supports.  An emphasis was placed on using therapist, doctor, therapy groups, self-help groups and problem-specific support groups to expand supports. Additionally, psychoeducation on various symptoms of mental illness was done at patients' requests.  The patient expressed that his doctor is his main support.  He was not very engaged in group, had just finished watching a movie, and kept getting distracted by movements outside.  Type of Therapy:  Process Group  Participation Level:  Minimal  Participation Quality:  Inattentive  Affect:  Blunted  Cognitive:  Oriented  Insight:  Limited  Engagement in Therapy:  Limited  Modes of Intervention:  Clarification, Education, Limit-setting, Problem-solving, Socialization, Support and Processing, Exploration, Discussion, Role-Play   Ambrose Mantle, LCSW 04/26/2012, 4:40 PM

## 2012-04-26 NOTE — Progress Notes (Signed)
[facility] MD Progress Note  04/26/2012 2:08 PM [name] [name]  MRN:  [id number] Subjective: Says he is going with the flow. Hopes to find out about discharge tomorrow. Has an apartment in [place] and family that he could spend Christmas with. Feels that meds are good. Depression is 5-6 /10. No longer feels the need for XXX designation.   Diagnosis:   Axis I: Major Depression, Recurrent severe; PTSD Axis II: Deferred Axis III:  Past Medical History  Diagnosis Date  . Seizure   . Protein C deficiency   . Protein C deficiency    Axis IV: other psychosocial or environmental problems, problems related to social environment and problems with primary support group Axis V: 41-50 serious symptoms  ADL's:  Intact  Sleep: Good  Appetite:  Fair  Suicidal Ideation:  Denies Homicidal Ideation:  Denies  Psychiatric Specialty Exam: Review of Systems  Constitutional: Negative.   HENT: Negative.   Eyes: Negative.   Respiratory: Negative.   Cardiovascular: Negative.   Gastrointestinal: Negative.   Genitourinary: Negative.   Musculoskeletal: Negative.   Skin: Negative.   Neurological: Negative.   Endo/Heme/Allergies: Negative.   Psychiatric/Behavioral: Positive for depression.    Blood pressure 118/79, pulse 73, temperature 97.2 F (36.2 C), temperature source Oral, resp. rate 17, height 5\' 8"  (1.727 m), weight 72.576 kg (160 lb).Body mass index is 24.33 kg/(m^2).  General Appearance: Casual  Eye Contact::  Fair  Speech:  Normal Rate  Volume:  Normal  Mood:  Depressed  Affect:  Congruent  Thought Process:  Coherent  Orientation:  Full (Time, Place, and Person)  Thought Content:  WDL  Suicidal Thoughts:  No  Homicidal Thoughts:  No  Memory:  Immediate;   Fair Recent;   Fair Remote;   Fair  Judgement:  Fair  Insight:  Fair  Psychomotor Activity:  Decreased  Concentration:  Fair  Recall:  Fair  Akathisia:  No  Handed:  Right  AIMS (if indicated):     Assets:   Communication Skills Desire for Improvement Social Support  Sleep:  Number of Hours: 5.75    Current Medications: Current Facility-Administered Medications  Medication Dose Route Frequency Provider Last Rate Last Dose  . acetaminophen (TYLENOL) tablet 650 mg  650 mg Oral Q6H PRN Rachael Fee, MD      . alum & mag hydroxide-simeth (MAALOX/MYLANTA) 200-200-20 MG/5ML suspension 30 mL  30 mL Oral Q4H PRN Rachael Fee, MD      . docusate sodium (COLACE) capsule 100 mg  100 mg Oral BID Rachael Fee, MD   100 mg at 04/26/12 0829  . folic acid (FOLVITE) tablet 1 mg  1 mg Oral Daily Rachael Fee, MD   1 mg at 04/26/12 4098  . lacosamide (VIMPAT) tablet 300 mg  300 mg Oral BID Rachael Fee, MD   300 mg at 04/26/12 1191  . levETIRAcetam (KEPPRA) tablet 2,000 mg  2,000 mg Oral BID Rachael Fee, MD   2,000 mg at 04/26/12 0829  . LORazepam (ATIVAN) tablet 0.5 mg  0.5 mg Oral BID Rachael Fee, MD   0.5 mg at 04/26/12 0829  . magnesium hydroxide (MILK OF MAGNESIA) suspension 30 mL  30 mL Oral Daily PRN Rachael Fee, MD      . morphine (MSIR) tablet 30 mg  30 mg Oral Q8H PRN Rachael Fee, MD   30 mg at 04/26/12 0631  . nicotine polacrilex (NICORETTE) gum 2 mg  2 mg Oral PRN  Rachael Fee, MD   2 mg at 04/26/12 1245  . PHENobarbital (LUMINAL) tablet 64.8 mg  64.8 mg Oral QHS Rachael Fee, MD   64.8 mg at 04/25/12 2056  . phenytoin (DILANTIN) ER capsule 200 mg  200 mg Oral BID Rachael Fee, MD   200 mg at 04/26/12 9147  . Rivaroxaban (XARELTO) tablet 20 mg  20 mg Oral Q supper Rachael Fee, MD   20 mg at 04/25/12 1724  . sertraline (ZOLOFT) tablet 75 mg  75 mg Oral Daily Rachael Fee, MD   75 mg at 04/26/12 0829  . thiamine (VITAMIN B-1) tablet 100 mg  100 mg Oral Daily Rachael Fee, MD   100 mg at 04/26/12 8295  . topiramate (TOPAMAX) tablet 100 mg  100 mg Oral BID Rachael Fee, MD   100 mg at 04/26/12 6213    Lab Results: No results found for this or any previous visit (from the past 48  hour(s)).  Physical Findings: AIMS: Facial and Oral Movements Muscles of Facial Expression: None, normal Lips and Perioral Area: None, normal Jaw: None, normal Tongue: None, normal,Extremity Movements Upper (arms, wrists, hands, fingers): None, normal Lower (legs, knees, ankles, toes): None, normal, Trunk Movements Neck, shoulders, hips: None, normal, Overall Severity Severity of abnormal movements (highest score from questions above): None, normal Incapacitation due to abnormal movements: None, normal Patient's awareness of abnormal movements (rate only patient's report): No Awareness, Dental Status Current problems with teeth and/or dentures?: No Does patient usually wear dentures?: No  CIWA:  CIWA-Ar Total: 0  COWS:  COWS Total Score: 0   Treatment Plan Summary: Daily contact with patient to assess and evaluate symptoms and progress in treatment Medication management  Plan:  Review of chart, medications, notes, and vital signs. 1-Individual and group therapy 2-Medication review for depression and seizure disorder 3-Coping skills for anger management--patient did get upset with staff yesterday, used his coping skills and walked away--calmed down--then, discussed the issue with staff calmly and resolved the issue.  Patient was excited that he had managed this feat and was encouraged to continue using his coping skills.  We discussed coping skills for depression when he returns home--using his support system, exercise,... 4-Supportive environment to optimize his care 5-Client is currently taking notes from things he learns in group, encouraged him to keep them readily available at home 6-Discharge plan in progress to prevent relapse and promote self-care  Medical Decision Making Problem Points:  Established problem, stable/improving (1) and Review of psycho-social stressors (1) Data Points:  Review of medication regiment & side effects (2)  I certify that inpatient services  furnished can reasonably be expected to improve the patient's condition.   Shawnika Pepin,MICKIE D.PA-C CAQ-Psych  04/26/2012, 2:08 PM

## 2012-04-26 NOTE — Progress Notes (Signed)
Patient ID: [name] [name], male   DOB: [birth year], 31 y.o.   MRN: [id number]  Problem: ETOH, Major Depression  D: Patient pleasant and cooperative in milieu, but displays drug-seeking behaviors. A: Monitor patient Q 15 minutes for safety, encourage staff/peer interaction and group participation. Administer medications as ordered by MD. R: Patient denies SI or plans to harm himself.

## 2012-04-26 NOTE — Progress Notes (Signed)
D) Pt has attended the program, but will often sit in group and drift off to sleep. Has a limited insight and cannot see the bigger picture. Will argue to prove his point. Today has not gotten into any power struggles and has made a Engineer, manufacturing with this Clinical research associate that should he become upset, that he will come and process out the issue. Pt has little tolerance and internalizes others words and actions. A) given support, reassurance and praise. Given encouragement and frequent 1:1's to prevent any issues today. Using a straight forward respectful approach with Pt. Along with responding to his feeling tones. R) Pt has not had any outbursts or difficulties today. Dealing respectfully with others.

## 2012-04-26 NOTE — Progress Notes (Signed)
Reviewed the note and agree with the treatment plan...Demarious Kapur, MD  

## 2012-04-26 NOTE — Progress Notes (Signed)
Group Topic/Focus:  Making Healthy Choices:   The focus of this group is to help patients identify negative/unhealthy choices they were using prior to admission and identify positive/healthier coping strategies to replace them upon discharge.  Participation Level:  Active  Participation Quality:  Appropriate  Affect:  Appropriate  Cognitive:  Alert  Insight:  Engaged  Engagement in Group:  Engaged  Additional Comments:    Ngai Parcell A 04/26/2012   

## 2012-04-26 NOTE — Progress Notes (Signed)
Psychoeducational Group Note  Date:  04/26/2012 Time:  0115  Group Topic/Focus:  Identifying Needs:   The focus of this group is to help patients identify their personal needs that have been historically problematic and identify healthy behaviors to address their needs.  Participation Level:  None  Participation Quality:  Drowsy  Affect:  Irritable  Cognitive:  Lacking  Insight:  Poor  Engagement in Group:  None  Additional Comments:  Patient shared that he is only joking and laughing when her does not want to feel depressed about his daughter dying.  Padraic Marinos, Newton Pigg 04/26/2012, 2:43 PM

## 2012-04-27 MED ORDER — FOLIC ACID 1 MG PO TABS: 1.0000 mg | ORAL_TABLET | Freq: Every day | ORAL | Status: DC

## 2012-04-27 MED ORDER — SERTRALINE HCL 25 MG PO TABS: 100.0000 mg | ORAL_TABLET | Freq: Every day | ORAL | Status: DC

## 2012-04-27 MED ORDER — DSS 100 MG PO CAPS: 100.0000 mg | ORAL_CAPSULE | Freq: Two times a day (BID) | ORAL | Status: DC

## 2012-04-27 MED ORDER — SERTRALINE HCL 100 MG PO TABS
100.0000 mg | ORAL_TABLET | Freq: Every day | ORAL | Status: DC
  Administered 2012-04-28 – 2012-05-13 (×16): 100 mg via ORAL
  Filled 2012-04-27 (×18): qty 1

## 2012-04-27 MED ORDER — THIAMINE HCL 100 MG PO TABS: 100.0000 mg | ORAL_TABLET | Freq: Every day | ORAL | Status: DC

## 2012-04-27 MED ORDER — RIVAROXABAN 20 MG PO TABS: 20.0000 mg | ORAL_TABLET | Freq: Every evening | ORAL | Status: DC

## 2012-04-27 MED ORDER — LACOSAMIDE 150 MG PO TABS: 300.0000 mg | ORAL_TABLET | Freq: Two times a day (BID) | ORAL | Status: DC

## 2012-04-27 MED ORDER — TOPIRAMATE 100 MG PO TABS: 100.0000 mg | ORAL_TABLET | Freq: Two times a day (BID) | ORAL | Status: DC

## 2012-04-27 MED ORDER — PHENYTOIN SODIUM EXTENDED 200 MG PO CAPS: 200.0000 mg | ORAL_CAPSULE | Freq: Two times a day (BID) | ORAL | Status: DC

## 2012-04-27 MED ORDER — PHENOBARBITAL 64.8 MG PO TABS: 64.8000 mg | ORAL_TABLET | Freq: Every day | ORAL | Status: DC

## 2012-04-27 MED ORDER — MORPHINE SULFATE 30 MG PO TABS: 30.0000 mg | ORAL_TABLET | Freq: Three times a day (TID) | ORAL | Status: DC | PRN

## 2012-04-27 MED ORDER — LEVETIRACETAM 1000 MG PO TABS: 2000.0000 mg | ORAL_TABLET | Freq: Two times a day (BID) | ORAL | Status: DC

## 2012-04-27 MED ORDER — LORAZEPAM 0.5 MG PO TABS: 0.5000 mg | ORAL_TABLET | Freq: Two times a day (BID) | ORAL | Status: DC

## 2012-04-27 NOTE — Discharge Instructions (Addendum)
Chemical Dependency  Chemical dependency is an addiction to drugs or alcohol. It is characterized by the repeated behavior of seeking out and using drugs and alcohol despite harmful consequences to the health and safety of ones self and others.   RISK FACTORS  There are certain situations or behaviors that increase a person's risk for chemical dependency. These include:   A family history of chemical dependency.   A history of mental health issues, including depression and anxiety.   A home environment where drugs and alcohol are easily available to you.   Drug or alcohol use at a young age.  SYMPTOMS   The following symptoms can indicate chemical dependency:   Inability to limit the use of drugs or alcohol.   Nausea, sweating, shakiness, and anxiety that occurs when alcohol or drugs are not being used.   An increase in amount of drugs or alcohol that is necessary to get drunk or high.  People who experience these symptoms can assess their use of drugs and alcohol by asking themselves the following questions:   Have you been told by friends or family that they are worried about your use of alcohol or drugs?   Do friends and family ever tell you about things you did while drinking alcohol or using drugs that you do not remember?   Do you lie about using alcohol or drugs or about the amounts you use?   Do you have difficulty completing daily tasks unless you use alcohol or drugs?   Is the level of your work or school performance lower because of your drug or alcohol use?   Do you get sick from using drugs or alcohol but keep using anyway?   Do you feel uncomfortable in social situations unless you use alcohol or drugs?   Do you use drugs or alcohol to help forget problems?  An answer of yes to any of these questions may indicate chemical dependency. Professional evaluation is suggested.  Document Released: 04/16/2001 Document Revised: 07/15/2011 Document Reviewed: 06/28/2010  [employer] Patient  Information 2013 [employer].

## 2012-04-27 NOTE — Progress Notes (Signed)
[facility] Adult Case Management Discharge Plan :  Will you be returning to the same living situation after discharge: Yes,  home with family At discharge, do you have transportation home?:Yes,  family Do you have the ability to pay for your medications:Yes,  insurance and family ([facility] outpatient)  Release of information consent forms completed and in the chart;  Patient's signature needed at discharge.  Patient to Follow up at: Follow-up Information    Follow up with [facility]. (Patient is to go to [place] walk-in clinic the day after his discharge with clinic opened from 9 AM to 3 PM Monday through Friday)    Contact information:   [address] [place], [place] [address] [phone]         Patient denies SI/HI:   Yes,  none reported    Safety Planning and Suicide Prevention discussed:  Yes,  completed with patient only, would not consent to family educations  Nail, Catalina Gravel 04/27/2012, 10:33 AM

## 2012-04-27 NOTE — Progress Notes (Signed)
[facility] Group Notes:  (Counselor/Nursing/MHT/Case Management/Adjunct)  04/27/2012 12:24 AM  Type of Therapy:  Psychoeducational Skills  Participation Level:  Minimal  Participation Quality:  Inattentive  Affect:  Flat  Cognitive:  Appropriate  Insight:  Limited  Engagement in Group:  Lacking  Engagement in Therapy:  Lacking  Modes of Intervention:  Education  Summary of Progress/Problems: The patient stated that he had a better day today, but would not elaborate any further. His goal for tomorrow is to speak with his doctor and to inquire about getting discharged.    Hazle Coca S 04/27/2012, 12:24 AM

## 2012-04-27 NOTE — Progress Notes (Signed)
[facility] MD Progress Note  04/27/2012 12:04 PM [name] [name]  MRN:  [id number] Subjective:  Patient endorsing suicidal thoughts this morning. States if he is discharged today, he cannot contract for safety outside the hospital. Denies a specific plan in the hospital. States the holiday time around christmas is difficult for him because of the death of his daughter. Patient has been confrontational with the nurses on the unit and engaging in drug seeking behavior. This was brought to his attention.  Diagnosis:   Axis I: Depressive Disorder NOS, Polysubstance abuse Axis II: Deferred Axis III:  Past Medical History  Diagnosis Date  . Seizure   . Protein C deficiency   . Protein C deficiency    Axis IV: economic problems and occupational problems Axis V: 51-60 moderate symptoms  ADL's:  Intact  Sleep: Fair  Appetite:  Fair  Psychiatric Specialty Exam: Review of Systems  Constitutional: Negative.   HENT: Negative.   Eyes: Negative.   Respiratory: Negative.   Cardiovascular: Negative.   Gastrointestinal: Negative.   Genitourinary: Negative.   Musculoskeletal: Negative.   Skin: Negative.   Neurological: Negative.   Endo/Heme/Allergies: Negative.   Psychiatric/Behavioral: Positive for depression and suicidal ideas. The patient is nervous/anxious.     Blood pressure 123/80, pulse 69, temperature 97.8 F (36.6 C), temperature source Oral, resp. rate 18, height 5\' 8"  (1.727 m), weight 72.576 kg (160 lb).Body mass index is 24.33 kg/(m^2).  General Appearance: Disheveled  Eye Solicitor::  Fair  Speech:  Clear and Coherent  Volume:  Normal  Mood:  Angry, Anxious, Dysphoric and Irritable  Affect:  Constricted  Thought Process:  Circumstantial and Linear  Orientation:  Full (Time, Place, and Person)  Thought Content:  Rumination  Suicidal Thoughts:  Yes.  without intent/plan  Homicidal Thoughts:  No  Memory:  Immediate;   Fair Recent;   Fair Remote;   Fair  Judgement:  Impaired   Insight:  Shallow  Psychomotor Activity:  Normal  Concentration:  Fair  Recall:  Fair  Akathisia:  No  Handed:  Right  AIMS (if indicated):     Assets:  Communication Skills  Sleep:  Number of Hours: 5.25    Current Medications: Current Facility-Administered Medications  Medication Dose Route Frequency Provider Last Rate Last Dose  . acetaminophen (TYLENOL) tablet 650 mg  650 mg Oral Q6H PRN Rachael Fee, MD      . alum & mag hydroxide-simeth (MAALOX/MYLANTA) 200-200-20 MG/5ML suspension 30 mL  30 mL Oral Q4H PRN Rachael Fee, MD      . docusate sodium (COLACE) capsule 100 mg  100 mg Oral BID Rachael Fee, MD   100 mg at 04/27/12 0755  . folic acid (FOLVITE) tablet 1 mg  1 mg Oral Daily Rachael Fee, MD   1 mg at 04/27/12 0756  . lacosamide (VIMPAT) tablet 300 mg  300 mg Oral BID Rachael Fee, MD   300 mg at 04/27/12 0830  . levETIRAcetam (KEPPRA) tablet 2,000 mg  2,000 mg Oral BID Rachael Fee, MD   2,000 mg at 04/27/12 0756  . LORazepam (ATIVAN) tablet 0.5 mg  0.5 mg Oral BID Rachael Fee, MD   0.5 mg at 04/27/12 0802  . magnesium hydroxide (MILK OF MAGNESIA) suspension 30 mL  30 mL Oral Daily PRN Rachael Fee, MD      . morphine (MSIR) tablet 30 mg  30 mg Oral Q8H PRN Rachael Fee, MD   30 mg at  04/27/12 0522  . nicotine polacrilex (NICORETTE) gum 2 mg  2 mg Oral PRN Rachael Fee, MD   2 mg at 04/27/12 1150  . PHENobarbital (LUMINAL) tablet 64.8 mg  64.8 mg Oral QHS Rachael Fee, MD   64.8 mg at 04/26/12 2051  . phenytoin (DILANTIN) ER capsule 200 mg  200 mg Oral BID Rachael Fee, MD   200 mg at 04/27/12 0758  . Rivaroxaban (XARELTO) tablet 20 mg  20 mg Oral Q supper Rachael Fee, MD   20 mg at 04/26/12 1716  . sertraline (ZOLOFT) tablet 75 mg  75 mg Oral Daily Rachael Fee, MD   75 mg at 04/27/12 0759  . thiamine (VITAMIN B-1) tablet 100 mg  100 mg Oral Daily Rachael Fee, MD   100 mg at 04/27/12 0801  . topiramate (TOPAMAX) tablet 100 mg  100 mg Oral BID Rachael Fee, MD   100 mg at 04/27/12 0801    Lab Results: No results found for this or any previous visit (from the past 48 hour(s)).  Physical Findings: AIMS: Facial and Oral Movements Muscles of Facial Expression: None, normal Lips and Perioral Area: None, normal Jaw: None, normal Tongue: None, normal,Extremity Movements Upper (arms, wrists, hands, fingers): None, normal Lower (legs, knees, ankles, toes): None, normal, Trunk Movements Neck, shoulders, hips: None, normal, Overall Severity Severity of abnormal movements (highest score from questions above): None, normal Incapacitation due to abnormal movements: None, normal Patient's awareness of abnormal movements (rate only patient's report): No Awareness, Dental Status Current problems with teeth and/or dentures?: No Does patient usually wear dentures?: No  CIWA:  CIWA-Ar Total: 4  COWS:  COWS Total Score: 0   Treatment Plan Summary: Daily contact with patient to assess and evaluate symptoms and progress in treatment Medication management  Plan: Continue current plan of care. Adjust medications as needed. Observe patient when taking his medications, since he was observed to have snorted his medicine last evening. Continue to monitor mood symptoms, discharge when safe and stable.  Medical Decision Making Problem Points:  Established problem, worsening (2), Review of last therapy session (1) and Review of psycho-social stressors (1) Data Points:  Review of medication regiment & side effects (2) Review of new medications or change in dosage (2)  I certify that inpatient services furnished can reasonably be expected to improve the patient's condition.   Pearce Littlefield 04/27/2012, 12:04 PM

## 2012-04-27 NOTE — Progress Notes (Signed)
Psychoeducational Group Note  Date:  Apr 30, 2012 Time:  2000  Group Topic/Focus:  Wrap-Up Group:   The focus of this group is to help patients review their daily goal of treatment and discuss progress on daily workbooks.  Participation Level:  None  Participation Quality:  Resistant  Affect:  Depressed  Cognitive:  Disorganized  Insight:  Poor  Engagement in Group:  Resistant  Additional Comments:  Patient shared that he had a very bad day and is depressed because his wife and daughter died.  Jeyli Zwicker, Newton Pigg Apr 30, 2012, 11:12 PM

## 2012-04-27 NOTE — Progress Notes (Addendum)
[facility] LCSW Group Therapy  04/27/2012 4:27 PM  Type of Therapy:  Group Therapy  Participation Level:  Active  Participation Quality:  Attentive  Affect:  Depressed  Cognitive:  Appropriate  Insight:  Developing/Improving  Engagement in Therapy:  Limited  Modes of Intervention:  Discussion, Exploration, Problem-solving and Support  Summary of Progress/Problems: The topic for group today was overcoming obstacles.  Pt discussed overcoming obstacles and what this means for pt. Patient discussed the major obstacles of working through the death of his daughter and daughters mother.  Patient discussed increased anger and irritability and difficulty asking for support from family.  Patient discussed using humor to avoid dealing with the grief. Patient discussed plan to talk with his family to request the type of support that would be helpful and also discussed joining a grief support group to further assist with the grieving process.   Verna Czech Enlow 04/27/2012, 4:27 PM

## 2012-04-27 NOTE — Progress Notes (Addendum)
D:  Patient would not fill out self inventory sheet.  Nurse offered to help patient.  Patient's roommate told [facility] early this morning that patient snorted his night medications.  Nurse informed N.P., MD and charge nurse.   Medications were given one at a time to patient, and talked to patient in between medications.  Patient became upset over this and stated nurse was being disrespectful to him.  Nurses on [address] hall were present during medication administration and heard conversation.  When nurse was in hallway with another patient, this patient said to "shut up".  Nurse informed patient that every patient here is important and has a right to talk.  And that he was important also.  It was no appropriate to be disrespectful to others in this hospital.  Patient asked several times to talk to MD.  MD was informed by nurse and has talked to patient.  Patient came back to medication window and stated he would take vimpax which he first stated he would not take earlier this morning. A:  Medications administered per MD order.  Support and safety checks completed as ordered. R:  Patient would not go to group this morning.  Denied SI and HI.  Denied A/V hallucinations.  Patient remains safe on unit. Patient discussed medication issue with MD.   Stated he could not contract at that time, so patient will not be discharged today.  Patient has been pleasant this afternoon and apologized for his attitude this morning. 1845  Patient went to dining room for dinner.  Continues to ask for nicotine gum prn per MD order.  Patient has become calmer during the course of the day.

## 2012-04-27 NOTE — Discharge Summary (Deleted)
Physician Discharge Summary Note  Patient:  [name] [name] is an 31 y.o., male MRN:  [id number] DOB:  Apr 11, 1981 Patient phone:  [phone] (home)  Patient address:   [address] [place] [address],   Date of Admission:  04/20/2012 Date of Discharge: 04/27/2012  Reason for Admission:  Depression with suicidal ideations, stopped taking his anti-seizure medication  Discharge Diagnoses: Active Problems:  * No active hospital problems. *   Review of Systems  Constitutional: Negative.   HENT: Negative.   Eyes: Negative.   Respiratory: Negative.   Cardiovascular: Negative.   Gastrointestinal: Negative.   Genitourinary: Negative.   Musculoskeletal: Negative.   Skin: Negative.   Neurological: Negative.   Endo/Heme/Allergies: Negative.   Psychiatric/Behavioral: Positive for depression and substance abuse.   Axis Diagnosis:   AXIS I:  Bereavement, Depressive Disorder NOS, Substance Abuse and Substance Induced Mood Disorder AXIS II:  Deferred AXIS III:   Past Medical History  Diagnosis Date  . Seizure   . Protein C deficiency   . Protein C deficiency    AXIS IV:  occupational problems, other psychosocial or environmental problems, problems related to social environment and problems with primary support group AXIS V:  61-70 mild symptoms  Level of Care:  OP  Hospital Course:    Consults:  neurology  Significant Diagnostic Studies:  labs: Completed and reviewed, stable  Discharge Vitals:   Blood pressure 126/85, pulse 71, temperature 97.8 F (36.6 C), temperature source Oral, resp. rate 18, height 5\' 8"  (1.727 m), weight 72.576 kg (160 lb). Body mass index is 24.33 kg/(m^2). Lab Results:   No results found for this or any previous visit (from the past 72 hour(s)).  Physical Findings: AIMS: Facial and Oral Movements Muscles of Facial Expression: None, normal Lips and Perioral Area: None, normal Jaw: None, normal Tongue: None, normal,Extremity Movements Upper  (arms, wrists, hands, fingers): None, normal Lower (legs, knees, ankles, toes): None, normal, Trunk Movements Neck, shoulders, hips: None, normal, Overall Severity Severity of abnormal movements (highest score from questions above): None, normal Incapacitation due to abnormal movements: None, normal Patient's awareness of abnormal movements (rate only patient's report): No Awareness, Dental Status Current problems with teeth and/or dentures?: No Does patient usually wear dentures?: No  CIWA:  CIWA-Ar Total: 0  COWS:  COWS Total Score: 0   Psychiatric Specialty Exam: See Psychiatric Specialty Exam and Suicide Risk Assessment completed by Attending Physician prior to discharge.  Discharge destination:  Home  Is patient on multiple antipsychotic therapies at discharge:  No   Has Patient had three or more failed trials of antipsychotic monotherapy by history:  No Recommended Plan for Multiple Antipsychotic Therapies:  N/A  Discharge Orders    Future Orders Please Complete By Expires   Diet - low sodium heart healthy      Activity as tolerated - No restrictions          Medication List     As of 04/27/2012  9:28 AM    TAKE these medications      Indication    DSS 100 MG Caps   Take 100 mg by mouth 2 (two) times daily.    Indication: Constipation      folic acid 1 MG tablet   Commonly known as: FOLVITE   Take 1 tablet (1 mg total) by mouth daily.    Indication: Deficiency of Folic Acid in the Diet      Lacosamide 150 MG Tabs   Take 2 tablets (300 mg total)  by mouth 2 (two) times daily.    Indication: Partial Onset Seizures      levETIRAcetam 1000 MG tablet   Commonly known as: KEPPRA   Take 2 tablets (2,000 mg total) by mouth every 12 (twelve) hours.    Indication: Tonic-Clonic Seizures      LORazepam 0.5 MG tablet   Commonly known as: ATIVAN   Take 1 tablet (0.5 mg total) by mouth 2 (two) times daily.    Indication: Status Epilepticus      morphine 30 MG tablet    Commonly known as: MSIR   Take 1 tablet (30 mg total) by mouth every 8 (eight) hours as needed.    Indication: Moderate to Severe Acute Pain, Moderate to Severe Chronic Pain      PHENobarbital 64.8 MG tablet   Commonly known as: LUMINAL   Take 1 tablet (64.8 mg total) by mouth at bedtime.    Indication: Simple Partial Seizures      phenytoin 200 MG ER capsule   Commonly known as: DILANTIN   Take 1 capsule (200 mg total) by mouth 2 (two) times daily.    Indication: Seizure      Rivaroxaban 20 MG Tabs   Commonly known as: XARELTO   Take 1 tablet (20 mg total) by mouth every evening.    Indication: thrombosis prevention      sertraline 25 MG tablet   Commonly known as: ZOLOFT   Take 4 tablets (100 mg total) by mouth daily.    Indication: depression      thiamine 100 MG tablet   Take 1 tablet (100 mg total) by mouth daily.    Indication: vitamin deficiency      topiramate 100 MG tablet   Commonly known as: TOPAMAX   Take 1 tablet (100 mg total) by mouth 2 (two) times daily.    Indication: Partial Onset Seizures           Follow-up Information    Follow up with [facility]. (Patient is to go to [facility] the day after his discharge with clinic opened from 9 AM to 3 PM Monday through Friday)    Contact information:   [address] [place], [place] [address] [phone]         Follow-up recommendations:  Activity as tolerated, low-sodium heart healthy  Comments:  Discharge home and follow-up with [facility] for therapy and medications  Total Discharge Time:  Greater than 30 minutes  Signed: [name], PMH-NP 04/27/2012, 9:28 AM

## 2012-04-27 NOTE — Progress Notes (Signed)
Psychoeducational Group Note  Date:  04/27/2012 Time:  1100a  Group Topic/Focus:  Self Care:   The focus of this group is to help patients understand the importance of self-care in order to improve or restore emotional, physical, spiritual, interpersonal, and financial health.  Participation Level:  Minimal  Participation Quality:  Appropriate and Attentive  Affect:  Appropriate  Cognitive:  Appropriate  Insight:  None  Engagement in Group:  Limited  Additional Comments:  Pt. Did not participate in self-care assessment  Meredith Staggers 04/27/2012, 3:08 PM

## 2012-04-27 NOTE — Progress Notes (Signed)
Patient ID: [name] [name], male   DOB: 08/15/1980, 31 y.o.   MRN: [id number] D: Pt. Observed in day room watching TV, no distress noted. Pt. Asked for Nicorette gum continuously. A: Pt. Will be monitored q54min for safety. Writer introduced self to pt. And encouraged pt. To verbalize concerns.  Staff encouraged group. R: Pt. Is safe on the unit. Pt. Verbalizes no concerns at this time. Pt. Attended group.

## 2012-04-27 NOTE — Progress Notes (Signed)
[facility] LCSW Aftercare Discharge Planning Group Note  04/27/2012 10:30 AM  Participation Quality:  Did not attend.   Nail, Catalina Gravel 04/27/2012, 10:30 AM

## 2012-04-28 MED ORDER — RISPERIDONE 0.5 MG PO TABS
ORAL_TABLET | ORAL | Status: AC
  Administered 2012-04-28: 10:00:00
  Filled 2012-04-28: qty 1

## 2012-04-28 MED ORDER — RISPERIDONE 0.5 MG PO TABS
0.5000 mg | ORAL_TABLET | Freq: Two times a day (BID) | ORAL | Status: DC
  Administered 2012-04-28 – 2012-05-13 (×30): 0.5 mg via ORAL
  Filled 2012-04-28: qty 14
  Filled 2012-04-28: qty 1
  Filled 2012-04-28 (×4): qty 14
  Filled 2012-04-28: qty 1
  Filled 2012-04-28: qty 14
  Filled 2012-04-28 (×2): qty 1
  Filled 2012-04-28 (×5): qty 14
  Filled 2012-04-28: qty 1
  Filled 2012-04-28 (×8): qty 14
  Filled 2012-04-28: qty 1
  Filled 2012-04-28 (×2): qty 14
  Filled 2012-04-28: qty 1
  Filled 2012-04-28 (×4): qty 14
  Filled 2012-04-28: qty 1
  Filled 2012-04-28: qty 14
  Filled 2012-04-28: qty 1

## 2012-04-28 NOTE — Progress Notes (Signed)
[facility] MD Progress Note  04/28/2012 9:58 AM [name] [name]  MRN:  [id number]  Subjective:  Patient has been agitated on unit, getting into altercations with clinical staff. Endorsing suicidal ideations without plan. Upset about his life, daughter`s death.  Diagnosis:   Axis I: Depressive Disorder NOS Axis II: Deferred Axis III:  Past Medical History  Diagnosis Date  . Seizure   . Protein C deficiency   . Protein C deficiency    Axis IV: housing problems and occupational problems Axis V: 51-60 moderate symptoms  ADL's:  Intact  Sleep: Fair  Appetite:  Poor  Psychiatric Specialty Exam: Review of Systems  Constitutional: Negative.   HENT: Negative.   Eyes: Negative.   Respiratory: Negative.   Cardiovascular: Negative.   Gastrointestinal: Negative.   Genitourinary: Negative.   Musculoskeletal: Negative.   Skin: Negative.   Neurological: Negative.   Endo/Heme/Allergies: Negative.   Psychiatric/Behavioral: Positive for depression and suicidal ideas. The patient is nervous/anxious.     Blood pressure 131/91, pulse 68, temperature 98.2 F (36.8 C), temperature source Oral, resp. rate 16, height 5\' 8"  (1.727 m), weight 72.576 kg (160 lb).Body mass index is 24.33 kg/(m^2).  General Appearance: Disheveled  Eye Solicitor::  Fair  Speech:  Clear and Coherent  Volume:  Normal  Mood:  Depressed and Dysphoric  Affect:  Constricted and Depressed  Thought Process:  Circumstantial  Orientation:  Full (Time, Place, and Person)  Thought Content:  WDL and Rumination  Suicidal Thoughts:  No  Homicidal Thoughts:  No  Memory:  Immediate;   Fair Recent;   Fair Remote;   Fair  Judgement:  Impaired  Insight:  Present  Psychomotor Activity:  Normal  Concentration:  Fair  Recall:  Fair  Akathisia:  No  Handed:  Right  AIMS (if indicated):     Assets:  Communication Skills  Sleep:  Number of Hours: 6.75    Current Medications: Current Facility-Administered Medications  Medication  Dose Route Frequency Provider Last Rate Last Dose  . acetaminophen (TYLENOL) tablet 650 mg  650 mg Oral Q6H PRN Rachael Fee, MD      . alum & mag hydroxide-simeth (MAALOX/MYLANTA) 200-200-20 MG/5ML suspension 30 mL  30 mL Oral Q4H PRN Rachael Fee, MD      . docusate sodium (COLACE) capsule 100 mg  100 mg Oral BID Rachael Fee, MD   100 mg at 04/28/12 0801  . folic acid (FOLVITE) tablet 1 mg  1 mg Oral Daily Rachael Fee, MD   1 mg at 04/28/12 0801  . lacosamide (VIMPAT) tablet 300 mg  300 mg Oral BID Rachael Fee, MD   300 mg at 04/28/12 0759  . levETIRAcetam (KEPPRA) tablet 2,000 mg  2,000 mg Oral BID Rachael Fee, MD   2,000 mg at 04/28/12 0800  . LORazepam (ATIVAN) tablet 0.5 mg  0.5 mg Oral BID Rachael Fee, MD   0.5 mg at 04/28/12 0802  . magnesium hydroxide (MILK OF MAGNESIA) suspension 30 mL  30 mL Oral Daily PRN Rachael Fee, MD      . morphine (MSIR) tablet 30 mg  30 mg Oral Q8H PRN Rachael Fee, MD   30 mg at 04/28/12 0534  . nicotine polacrilex (NICORETTE) gum 2 mg  2 mg Oral PRN Rachael Fee, MD   2 mg at 04/28/12 1478  . PHENobarbital (LUMINAL) tablet 64.8 mg  64.8 mg Oral QHS Rachael Fee, MD   64.8 mg at  04/27/12 2201  . phenytoin (DILANTIN) ER capsule 200 mg  200 mg Oral BID Rachael Fee, MD   200 mg at 04/28/12 0800  . risperiDONE (RISPERDAL) 0.5 MG tablet           . risperiDONE (RISPERDAL) tablet 0.5 mg  0.5 mg Oral BID Pao Haffey, MD      . Rivaroxaban (XARELTO) tablet 20 mg  20 mg Oral Q supper Rachael Fee, MD   20 mg at 04/27/12 1651  . sertraline (ZOLOFT) tablet 100 mg  100 mg Oral Daily Sanders Manninen, MD   100 mg at 04/28/12 0802  . thiamine (VITAMIN B-1) tablet 100 mg  100 mg Oral Daily Rachael Fee, MD   100 mg at 04/28/12 4540  . topiramate (TOPAMAX) tablet 100 mg  100 mg Oral BID Rachael Fee, MD   100 mg at 04/28/12 9811    Lab Results: No results found for this or any previous visit (from the past 48 hour(s)).  Physical Findings: AIMS:  Facial and Oral Movements Muscles of Facial Expression: None, normal Lips and Perioral Area: None, normal Jaw: None, normal Tongue: None, normal,Extremity Movements Upper (arms, wrists, hands, fingers): None, normal Lower (legs, knees, ankles, toes): None, normal, Trunk Movements Neck, shoulders, hips: None, normal, Overall Severity Severity of abnormal movements (highest score from questions above): None, normal Incapacitation due to abnormal movements: None, normal Patient's awareness of abnormal movements (rate only patient's report): No Awareness, Dental Status Current problems with teeth and/or dentures?: No Does patient usually wear dentures?: No  CIWA:  CIWA-Ar Total: 4  COWS:  COWS Total Score: 0   Treatment Plan Summary: Daily contact with patient to assess and evaluate symptoms and progress in treatment Medication management  Plan: Start Risperdal at 0.5mg  po bid to target agitation and mood symptoms. Encouraged patient to attend groups, not to engage in conflict and focus on learning coping skills.  Medical Decision Making Problem Points:  Established problem, worsening (2), Review of last therapy session (1) and Review of psycho-social stressors (1) Data Points:  Review of medication regiment & side effects (2) Review of new medications or change in dosage (2)  I certify that inpatient services furnished can reasonably be expected to improve the patient's condition.   Abdou Stocks 04/28/2012, 9:58 AM

## 2012-04-28 NOTE — Progress Notes (Signed)
D:   Patient's self inventory sheet, patient sleeps fair, improving appetite, normal energy level, improving attention span.  Rated depression and hopelessness #6.  SI off/on, contracts for safety.   Pain in past 24 hours. Pain goal #2 today.  Worst pain #5.  After discharge, will deal with grief positive.  No questions for staff.   Unsure about discharge plans.  No problems taking medications after discharge. A:  Medications administered per MD order.  Support and encouragement given throughout day.  Support and safety checks completed as ordered. R:  Denied HI.  Denied A/V hallucinations.  SI, contracts for safety.  Patient remains safe on unit. Patient became upset this morning because he wanted nicotine gum to come out of the pyxis not out of regular medication drawer because the white gum comes out of the pyxis.   Charge nurse and MD informed.  MD spoke with patient and respirdal ordered for patient.  Patient is more calm this afternoon.

## 2012-04-28 NOTE — Progress Notes (Signed)
Psychoeducational Group Note  Date:  04/28/2012 Time:  2030  Group Topic/Focus:  Wrap-Up Group:   The focus of this group is to help patients review their daily goal of treatment and discuss progress on daily workbooks.  Participation Level:  Active  Participation Quality:  Appropriate, Attentive, Drowsy, Sharing and Supportive  Affect:  Appropriate  Cognitive:  Appropriate  Insight:  Engaged  Engagement in Group:  Engaged  Additional CommentsDahlia Client [name] 04/28/2012, 9:55 PM

## 2012-04-29 NOTE — Progress Notes (Signed)
D: Patient appropriate and cooperative with staff. Sad at times. He reported on self inventory sheet that his appetite is good, energy level is normal, and ability to pay attention is good. Patient rated depression and feelings of hopelessness "5".  A: Support and encouragement provided to patient. Administered scheduled medications per MD orders. Monitor 15 minute checks for safety.  R: Patient receptive. Passive SI, but contracts for safety. Denies HI/AVH. Patient remains safe on the unit.

## 2012-04-29 NOTE — Progress Notes (Signed)
Patient ID: [name] [name], male   DOB: 30-Sep-1980, 31 y.o.   MRN: [id number] D: Pt. In bed, resp. Even. A: Staff will monitor q68min safety. Writer will assess for s/s of distress. R: Pt. Is safe on the unit. No distress noted, resp. Even, unlabored.

## 2012-04-29 NOTE — Progress Notes (Signed)
D: Pt. Has been up & about on the unit ; brightens on approach;depression & hopelessness=5.A: Pt supported & encouraged. Continues on 15 minute checks.R: Pt safety maintained.

## 2012-04-29 NOTE — Progress Notes (Signed)
[facility] MD Progress Note  04/29/2012 11:20 AM [name] [name]  MRN:  [id number]  Subjective: Anticipates discharge tomorrow. Plans to start anger& grief therapy soon. Gave him info about Grief Recovery Handbook and therapists in [place]. S;ept allright not agitated today.  Diagnosis:   Axis I: Depressive Disorder NOS Axis II: Deferred Axis III:  Past Medical History  Diagnosis Date  . Seizure   . Protein C deficiency   . Protein C deficiency    Axis IV: housing problems and occupational problems Axis V: 51-60 moderate symptoms  ADL's:  Intact  Sleep: Fair  Appetite:  Poor says he ate lunch yesterday   Psychiatric Specialty Exam: Review of Systems  Constitutional: Negative.   HENT: Negative.   Eyes: Negative.   Respiratory: Negative.   Cardiovascular: Negative.   Gastrointestinal: Negative.   Genitourinary: Negative.   Musculoskeletal: Negative.   Skin: Negative.   Neurological: Negative.   Endo/Heme/Allergies: Negative.   Psychiatric/Behavioral: Positive for depression and suicidal ideas. The patient is nervous/anxious.     Blood pressure 120/79, pulse 79, temperature 98.2 F (36.8 C), temperature source Oral, resp. rate 16, height 5\' 8"  (1.727 m), weight 72.576 kg (160 lb).Body mass index is 24.33 kg/(m^2).  General Appearance: showered wearing a sweatshirt with hoodie  Eye Contact::  Fair  Speech:  Clear and Coherent  Volume:  Normal  Mood:  Depressed and Dysphoric  Affect:  Constricted and Depressed  Thought Process:  Circumstantial  Orientation:  Full (Time, Place, and Person)  Thought Content:  WDL and Rumination  Suicidal Thoughts:  No  Homicidal Thoughts:  No  Memory:  Immediate;   Fair Recent;   Fair Remote;   Fair  Judgement:  Impaired  Insight:  Present  Psychomotor Activity:  Normal  Concentration:  Fair  Recall:  Fair  Akathisia:  No  Handed:  Right  AIMS (if indicated):     Assets:  Communication Skills  Sleep:  Number of Hours: 6.75     Current Medications: Current Facility-Administered Medications  Medication Dose Route Frequency Provider Last Rate Last Dose  . acetaminophen (TYLENOL) tablet 650 mg  650 mg Oral Q6H PRN Rachael Fee, MD      . alum & mag hydroxide-simeth (MAALOX/MYLANTA) 200-200-20 MG/5ML suspension 30 mL  30 mL Oral Q4H PRN Rachael Fee, MD      . docusate sodium (COLACE) capsule 100 mg  100 mg Oral BID Rachael Fee, MD   100 mg at 04/29/12 0835  . folic acid (FOLVITE) tablet 1 mg  1 mg Oral Daily Rachael Fee, MD   1 mg at 04/29/12 [phone]  . lacosamide (VIMPAT) tablet 300 mg  300 mg Oral BID Rachael Fee, MD   300 mg at 04/29/12 4782  . levETIRAcetam (KEPPRA) tablet 2,000 mg  2,000 mg Oral BID Rachael Fee, MD   2,000 mg at 04/29/12 0839  . LORazepam (ATIVAN) tablet 0.5 mg  0.5 mg Oral BID Rachael Fee, MD   0.5 mg at 04/29/12 9562  . magnesium hydroxide (MILK OF MAGNESIA) suspension 30 mL  30 mL Oral Daily PRN Rachael Fee, MD      . morphine (MSIR) tablet 30 mg  30 mg Oral Q8H PRN Rachael Fee, MD   30 mg at 04/29/12 0601  . nicotine polacrilex (NICORETTE) gum 2 mg  2 mg Oral PRN Rachael Fee, MD   2 mg at 04/29/12 1039  . PHENobarbital (LUMINAL) tablet 64.8 mg  64.8 mg Oral QHS Rachael Fee, MD   64.8 mg at 04/28/12 2103  . phenytoin (DILANTIN) ER capsule 200 mg  200 mg Oral BID Rachael Fee, MD   200 mg at 04/29/12 [phone]  . risperiDONE (RISPERDAL) tablet 0.5 mg  0.5 mg Oral BID Himabindu Ravi, MD   0.5 mg at 04/29/12 0837  . Rivaroxaban (XARELTO) tablet 20 mg  20 mg Oral Q supper Rachael Fee, MD   20 mg at 04/28/12 1736  . sertraline (ZOLOFT) tablet 100 mg  100 mg Oral Daily Himabindu Ravi, MD   100 mg at 04/29/12 0837  . thiamine (VITAMIN B-1) tablet 100 mg  100 mg Oral Daily Rachael Fee, MD   100 mg at 04/29/12 8657  . topiramate (TOPAMAX) tablet 100 mg  100 mg Oral BID Rachael Fee, MD   100 mg at 04/29/12 8469    Lab Results: No results found for this or any previous visit (from the  past 48 hour(s)).  Physical Findings: AIMS: Facial and Oral Movements Muscles of Facial Expression:  (Pt. in bed eyes closed, no abnormal movement noted.) Lips and Perioral Area: None, normal Jaw: None, normal Tongue: None, normal,Extremity Movements Upper (arms, wrists, hands, fingers): None, normal Lower (legs, knees, ankles, toes): None, normal, Trunk Movements Neck, shoulders, hips: None, normal, Overall Severity Severity of abnormal movements (highest score from questions above): None, normal Incapacitation due to abnormal movements: None, normal Patient's awareness of abnormal movements (rate only patient's report): No Awareness, Dental Status Current problems with teeth and/or dentures?: No Does patient usually wear dentures?: No  CIWA:  CIWA-Ar Total: 4  COWS:  COWS Total Score: 2   Treatment Plan Summary: Daily contact with patient to assess and evaluate symptoms and progress in treatment Medication management  Plan: Start Risperdal at 0.5mg  po bid to target agitation and mood symptoms. Encouraged patient to attend groups, not to engage in conflict and focus on learning coping skills.  Medical Decision Making Problem Points:  Established problem, worsening (2), Review of last therapy session (1) and Review of psycho-social stressors (1) Data Points:  Review of medication regiment & side effects (2) Review of new medications or change in dosage (2)  I certify that inpatient services furnished can reasonably be expected to improve the patient's condition.   Ardenia Stiner,MICKIE D.PA-C CAQ-Psych   04/29/2012, 11:20 AM

## 2012-04-30 MED ORDER — TOPIRAMATE 100 MG PO TABS
100.0000 mg | ORAL_TABLET | Freq: Two times a day (BID) | ORAL | Status: DC
  Filled 2012-04-30 (×2): qty 6

## 2012-04-30 MED ORDER — PHENYTOIN SODIUM EXTENDED 100 MG PO CAPS
200.0000 mg | ORAL_CAPSULE | Freq: Two times a day (BID) | ORAL | Status: DC
  Filled 2012-04-30 (×2): qty 12

## 2012-04-30 MED ORDER — RISPERIDONE 0.5 MG PO TABS
0.5000 mg | ORAL_TABLET | Freq: Two times a day (BID) | ORAL | Status: DC
  Filled 2012-04-30 (×2): qty 28

## 2012-04-30 MED ORDER — RIVAROXABAN 20 MG PO TABS: 20.0000 mg | ORAL_TABLET | Freq: Every evening | ORAL | Status: DC

## 2012-04-30 MED ORDER — RIVAROXABAN 20 MG PO TABS
20.0000 mg | ORAL_TABLET | Freq: Every day | ORAL | Status: DC
  Filled 2012-04-30: qty 6

## 2012-04-30 MED ORDER — DSS 100 MG PO CAPS: 100.0000 mg | ORAL_CAPSULE | Freq: Two times a day (BID) | ORAL | Status: DC

## 2012-04-30 MED ORDER — LEVETIRACETAM 500 MG PO TABS
2000.0000 mg | ORAL_TABLET | Freq: Two times a day (BID) | ORAL | Status: DC
  Filled 2012-04-30 (×2): qty 24

## 2012-04-30 MED ORDER — PHENOBARBITAL 16.2 MG PO TABS
64.8000 mg | ORAL_TABLET | Freq: Every day | ORAL | Status: DC
  Filled 2012-04-30: qty 12

## 2012-04-30 MED ORDER — TOPIRAMATE 100 MG PO TABS: 100.0000 mg | ORAL_TABLET | Freq: Two times a day (BID) | ORAL | Status: DC

## 2012-04-30 MED ORDER — MORPHINE SULFATE 30 MG PO TABS: 30.0000 mg | ORAL_TABLET | Freq: Three times a day (TID) | ORAL | Status: DC | PRN

## 2012-04-30 MED ORDER — THIAMINE HCL 100 MG PO TABS: 100.0000 mg | ORAL_TABLET | Freq: Every day | ORAL | Status: DC

## 2012-04-30 MED ORDER — LORAZEPAM 0.5 MG PO TABS: 0.5000 mg | ORAL_TABLET | Freq: Two times a day (BID) | ORAL | Status: DC

## 2012-04-30 MED ORDER — LACOSAMIDE 150 MG PO TABS: 300.0000 mg | ORAL_TABLET | Freq: Two times a day (BID) | ORAL | Status: DC

## 2012-04-30 MED ORDER — RISPERIDONE 0.5 MG PO TABS: 0.5000 mg | ORAL_TABLET | Freq: Two times a day (BID) | ORAL | Status: DC

## 2012-04-30 MED ORDER — LEVETIRACETAM 1000 MG PO TABS: 2000.0000 mg | ORAL_TABLET | Freq: Two times a day (BID) | ORAL | Status: DC

## 2012-04-30 MED ORDER — PHENOBARBITAL 64.8 MG PO TABS: 64.8000 mg | ORAL_TABLET | Freq: Every day | ORAL | Status: DC

## 2012-04-30 MED ORDER — FOLIC ACID 1 MG PO TABS: 1.0000 mg | ORAL_TABLET | Freq: Every day | ORAL | Status: DC

## 2012-04-30 MED ORDER — SERTRALINE HCL 100 MG PO TABS: 100.0000 mg | ORAL_TABLET | Freq: Every day | ORAL | Status: DC

## 2012-04-30 MED ORDER — PHENYTOIN SODIUM EXTENDED 200 MG PO CAPS: 200.0000 mg | ORAL_CAPSULE | Freq: Two times a day (BID) | ORAL | Status: DC

## 2012-04-30 MED ORDER — RIVAROXABAN 10 MG PO TABS
20.0000 mg | ORAL_TABLET | Freq: Every day | ORAL | Status: DC
  Filled 2012-04-30: qty 6

## 2012-04-30 NOTE — Progress Notes (Signed)
Agree with assessment and plan Eliz Nigg A. Leah Skora, M.D. 

## 2012-04-30 NOTE — Progress Notes (Signed)
[facility] MD Progress Note  04/30/2012 9:57 AM [name] [name]  MRN:  [id number] Subjective:  Patient reports mood has improved significantly. Tolerating Risperdal well without side effects. His family will be coming back late tonite, he would like to be discharged early tomorrow morning.  Diagnosis:   Axis I: Bereavement and Depressive Disorder NOS Axis II: Deferred Axis III:  Past Medical History  Diagnosis Date  . Seizure   . Protein C deficiency   . Protein C deficiency    Axis IV: housing problems and occupational problems Axis V: 51-60 moderate symptoms  ADL's:  Intact  Sleep: Fair  Appetite:  Fair    Psychiatric Specialty Exam: Review of Systems  Constitutional: Negative.   HENT: Negative.   Eyes: Negative.   Respiratory: Negative.   Cardiovascular: Negative.   Gastrointestinal: Negative.   Genitourinary: Negative.   Musculoskeletal: Negative.   Skin: Negative.   Neurological: Negative.   Endo/Heme/Allergies: Negative.   Psychiatric/Behavioral: Positive for depression.    Blood pressure 137/89, pulse 71, temperature 97.2 F (36.2 C), temperature source Oral, resp. rate 16, height 5\' 8"  (1.727 m), weight 72.576 kg (160 lb).Body mass index is 24.33 kg/(m^2).  General Appearance: Disheveled  Eye Solicitor::  Fair  Speech:  Clear and Coherent  Volume:  Normal  Mood:  Dysphoric  Affect:  Full Range  Thought Process:  Coherent  Orientation:  Full (Time, Place, and Person)  Thought Content:  WDL  Suicidal Thoughts:  No  Homicidal Thoughts:  No  Memory:  Immediate;   Fair Recent;   Fair Remote;   Fair  Judgement:  Fair  Insight:  Fair  Psychomotor Activity:  Normal  Concentration:  Fair  Recall:  Fair  Akathisia:  No  Handed:  Right  AIMS (if indicated):     Assets:  Communication Skills Desire for Improvement  Sleep:  Number of Hours: 5.75    Current Medications: Current Facility-Administered Medications  Medication Dose Route Frequency Provider Last  Rate Last Dose  . acetaminophen (TYLENOL) tablet 650 mg  650 mg Oral Q6H PRN Rachael Fee, MD      . alum & mag hydroxide-simeth (MAALOX/MYLANTA) 200-200-20 MG/5ML suspension 30 mL  30 mL Oral Q4H PRN Rachael Fee, MD      . docusate sodium (COLACE) capsule 100 mg  100 mg Oral BID Rachael Fee, MD   100 mg at 04/29/12 1706  . folic acid (FOLVITE) tablet 1 mg  1 mg Oral Daily Rachael Fee, MD   1 mg at 04/30/12 0810  . lacosamide (VIMPAT) tablet 300 mg  300 mg Oral BID Rachael Fee, MD   300 mg at 04/30/12 0810  . levETIRAcetam (KEPPRA) tablet 2,000 mg  2,000 mg Oral BID Rachael Fee, MD   2,000 mg at 04/30/12 0810  . LORazepam (ATIVAN) tablet 0.5 mg  0.5 mg Oral BID Rachael Fee, MD   0.5 mg at 04/30/12 0809  . magnesium hydroxide (MILK OF MAGNESIA) suspension 30 mL  30 mL Oral Daily PRN Rachael Fee, MD      . morphine (MSIR) tablet 30 mg  30 mg Oral Q8H PRN Rachael Fee, MD   30 mg at 04/30/12 [phone]  . nicotine polacrilex (NICORETTE) gum 2 mg  2 mg Oral PRN Rachael Fee, MD   2 mg at 04/30/12 0810  . PHENobarbital (LUMINAL) tablet 64.8 mg  64.8 mg Oral QHS Rachael Fee, MD   64.8 mg at 04/29/12  2114  . phenytoin (DILANTIN) ER capsule 200 mg  200 mg Oral BID Rachael Fee, MD   200 mg at 04/30/12 0810  . risperiDONE (RISPERDAL) tablet 0.5 mg  0.5 mg Oral BID Ramari Bray, MD   0.5 mg at 04/30/12 0810  . Rivaroxaban (XARELTO) tablet 20 mg  20 mg Oral Q supper Rachael Fee, MD   20 mg at 04/29/12 1705  . sertraline (ZOLOFT) tablet 100 mg  100 mg Oral Daily Sye Schroepfer, MD   100 mg at 04/30/12 0810  . thiamine (VITAMIN B-1) tablet 100 mg  100 mg Oral Daily Rachael Fee, MD   100 mg at 04/30/12 0810  . topiramate (TOPAMAX) tablet 100 mg  100 mg Oral BID Rachael Fee, MD   100 mg at 04/30/12 1610    Lab Results: No results found for this or any previous visit (from the past 48 hour(s)).  Physical Findings: AIMS: Facial and Oral Movements Muscles of Facial Expression:  (Pt. in  bed eyes closed, no abnormal movement noted.) Lips and Perioral Area: None, normal Jaw: None, normal Tongue: None, normal,Extremity Movements Upper (arms, wrists, hands, fingers): None, normal Lower (legs, knees, ankles, toes): None, normal, Trunk Movements Neck, shoulders, hips: None, normal, Overall Severity Severity of abnormal movements (highest score from questions above): None, normal Incapacitation due to abnormal movements: None, normal Patient's awareness of abnormal movements (rate only patient's report): No Awareness, Dental Status Current problems with teeth and/or dentures?: No Does patient usually wear dentures?: No  CIWA:  CIWA-Ar Total: 4  COWS:  COWS Total Score: 2   Treatment Plan Summary: Daily contact with patient to assess and evaluate symptoms and progress in treatment Medication management  Plan: Continue current plan of care. Plan for discharge by 7 am tomorrow morning.  Medical Decision Making Problem Points:  Established problem, stable/improving (1), Review of last therapy session (1) and Review of psycho-social stressors (1) Data Points:  Review of medication regiment & side effects (2)  I certify that inpatient services furnished can reasonably be expected to improve the patient's condition.   [name] 04/30/2012, 9:57 AM

## 2012-04-30 NOTE — Progress Notes (Signed)
  D) Patient pleasant and cooperative upon my assessment. Patient completed Patient Self Inventory, reports slept "well," and  appetite is "improving." Patient rates depression as  6 /10, patient rates hopeless feelings as 5 /10. Patient endorses "off and on" SI, contracts verbally for safety with RN.  Patient denies HI, denies A/V hallucinations.   A) Patient offered support and encouragement, patient encouraged to discuss feelings/concerns with staff. Patient verbalized understanding. Patient monitored Q15 minutes for safety. Patient met with MD  to discuss today's goals and plan of care.  R) Patient visible in milieu, attending groups in day room and meals in dining room. Patient appropriate with staff and peers.   Patient taking medications as ordered. Patient insightful with a plan to attend "grief counseling" once he is discharged from [facility]. Will continue to monitor.

## 2012-04-30 NOTE — Clinical Social Work Note (Signed)
[facility] LCSW Group Therapy       Living a Balanced Life  1:15-2:30           04/30/2012 2:57 PM    Type of Therapy:  Group Therapy  Participation Level:  Appropriate  Participation Quality:  Appropriate  Affect:  Appropriate  Cognitive:  Attentive Appropriate  Insight:  Engaged  Engagement in Therapy:  Engaged  Modes of Intervention:  Discussion Exploration Problem-Solving Supportive  Summary of Progress/Problems:  Patient shared a balanced life for him is working and spending time enjoying the weekend with friends.  Wynn Banker 04/30/2012 2:57 PM

## 2012-04-30 NOTE — Social Work (Signed)
Pt attended discharge planning group and actively participated in group.  CSW provided pt with today's workbook. Suicide prevention information and safety planning were also discussed during today's group. Patient stated he is feeling "fair" today and ranks himself a 5-6 across-the-board for depression, anxiety, and helplessness and hopelessness. Patient reports having off and on suicidal ideations but stated he could contract for safety.

## 2012-04-30 NOTE — Progress Notes (Signed)
Patient ID: [name] [name], male   DOB: 07/12/1980, 31 y.o.   MRN: [id number] D: pt. Eyes closed, resp. Even. A: Staff will monitor q107min for safety. Writer will monitor for s/s of distress. R: pt. Is safe on the unit. No distress noted. Resp. Unlabored.

## 2012-05-01 MED ORDER — MORPHINE SULFATE 15 MG PO TABS: 30.0000 mg | ORAL_TABLET | Freq: Two times a day (BID) | ORAL | Status: DC | PRN

## 2012-05-01 MED ORDER — MORPHINE SULFATE 15 MG PO TABS
30.0000 mg | ORAL_TABLET | Freq: Three times a day (TID) | ORAL | Status: DC | PRN
  Administered 2012-05-01 – 2012-05-04 (×8): 30 mg via ORAL
  Filled 2012-05-01 (×8): qty 2

## 2012-05-01 NOTE — Progress Notes (Signed)
Patient ID: [name] [name], male   DOB: 01/01/1981, 31 y.o.   MRN: [id number]  Patient was to be discharged today but made a statement that he would go home and take an overdose of his medications.    I met with patient and discussed his position.  Patient states "I am not going to lie; I do feel SI; I can't help that I don't feel any better.  When I go home I see the same old stuff.  I look at all that stuff that is the same before they died.  My wife and daughter died Jun 12, 2024this year.    I informed patient that I understand that it is hard to get over.  I also informed him that it is our responsibility to make sure that he is safe and that if he made statements or felt that he would kill himself when he got home that we had to take steps to make sure that he was safe.  Also discussed that he may have reached the maximum hear and may need more long term care.  Informed patient that I would speak with his physician and that we would determine what the best steps were for him.  Patient agreed that he did not need to go home at this time related to the SI and that he did need long term care.  At this time I feel that this patient need to stay here at facility until D/C plan has been set to address patient need and well being.    Berneice Heinrich RN sat in on discussion and discussed findings with Everlene Balls RN  Assunta Found FNP

## 2012-05-01 NOTE — Progress Notes (Signed)
D - Patient very fixated on his medications. At beginning of shift patient asked this RN to "make sure he has enough pain medicine for him to take tonight, I don't want to run out." Patient stated "they ran out last night and the security guy had to get some from the hospital and I don't want to run out tonight." Patient continues to request nicotine gum every hour. Patient attended group tonight, interacting with peers appropriately. Patient with passive SI, verbally contracts for safety.  A - Patient offered encouragement and support through therapuetic conversation. Encouraged patient to talk with staff about any concerns or questions. Medications given as ordered.  R - Patient safety maintained with Q 15 minute checks.

## 2012-05-01 NOTE — Progress Notes (Signed)
Patient ID: [name] [name], male   DOB: 1981-03-31, 31 y.o.   MRN: [id number] D: Pt. In day room interacts with other clients, asks for nicorette gum constantly. Pt. Says gum helps calm his nerves. A: Staff will monitor q54min for safety. Staff encouraged group. R: Pt. Is safe on the unit. Pt. Attends group.

## 2012-05-01 NOTE — Social Work (Signed)
Interdisciplinary Treatment Plan Update (Adult)  Date:  05/01/2012  Time Reviewed:  6:41 AM    Progress in Treatment: Attending groups:   Yes   Participating in groups:  Yes Taking medication as prescribed:  Yes Tolerating medication:  Yes Family/Significant othe contact made; patient refused Patient understands diagnosis:  Yes Discussing patient identified problems/goals with staff: Yes Medical problems stabilized or resolved: Yes Denies suicidal/homicidal ideation: Yes Issues/concerns per patient self-inventory:  Other:  New problem(s) identified: None  Reason for Continuation of Hospitalization: Patient discharging today   Interventions implemented related to continuation of hospitalization:  Medication mgement; safety checks q 15 mins; coping skills development  Additional comments: Patient reports he is ready for discharge today and says his family will check in on him on a regular basis once he returns to his apartment. Patient listed his sister and stepfather and support people he could call should he become overwhelmed. Patient denies having any suicidal ideations and says he is ready to leave.  Estimated length of stay: Discharging today  Discharge Plan:  Outpatient follow up scheduled New goal(s):  Review of initial/current patient goals per problem list:    1.  Goal(s): Eliminate SI/other thoughts of self harm   Met:  Yes  Target date: d/c  As evidenced by: Patient will no longer endorse SI/HI or other thoughts of self harm.    2.  Goal (s):Reduce depression/anxiety  Met: Yes  Target date: d/c  As evidenced by: Patient will rate symptoms at four or below    3.  Goal(s):.stabilize on meds   Met:  Yes  Target date: d/c  As evidenced by: Patient will report being stabilized on medications - less symptomatic    4.  Goal(s): Refer for outpatient follow up   Met:  Yes  Target date: d/c  As evidenced by: Follow up appointment will be  scheduled    Attendees: Patient:   @TD  6:41 AM  Physican:  Patrick North, MD @TD  6:41 AM  Nursing:  Berneice Heinrich, RN  05/01/2012 6:41 AM  Nursing:    @TD  6:41 AM  Clinical Social Worker:  Patton Salles, LCSW @TD  6:41 AM  Other: Tera Helper -NP 05/01/2012 6:41 AM   Other:         05/01/2012 6:41 AM Other:

## 2012-05-01 NOTE — Progress Notes (Signed)
  D) Patient tearful but cooperative upon my assessment. Patient completed Patient Self Inventory, reports slept "well," and  appetite is "good." Patient rates depression as   7/10, patient rates hopeless feelings as  7/10. Patient endorses passive SI, contracts verbally for safety with RN. Patient denies HI, denies A/V hallucinations.   A) Patient offered support and encouragement, patient encouraged to discuss feelings/concerns with staff. Patient verbalized understanding. Patient monitored Q15 minutes for safety. Patient met with MD  to discuss today's goals and plan of care.  R) Patient visible in milieu, attending groups in day room and meals in dining room. Patient appropriate with staff and peers.   Patient taking medications as ordered. Patient seen in treatment team discussed discharge, patient verbalized understanding. Patient could not confirm time for family member who was to drive him home. Patient discharge order cancelled after patient became SI with thoughts to overdose on samples. PA made aware, new orders written. Patient contracts verbally for safety with RN. Will continue to monitor.

## 2012-05-01 NOTE — Social Work (Signed)
[facility] LCSW Aftercare Discharge Planning Group Note  05/01/2012  8:45 AM  Participation Quality:  Attentive and Sharing  Affect:  Anxious and Blunted  Cognitive:  Appropriate  Insight:  Improving  Engagement in Group:  Engaged  Modes of Intervention:  Discussion, Exploration, Socialization and Support  Summary of Progress/Problems: Pt attended discharge planning group and actively participated in group.  CSW provided pt with today's workbook.  Patient reports he is feeling better but admits to being somewhat nervous, though happy about going home. Patient stated he was ready for discharge today and ranks himself or 3 for depression, 0 for helplessness and hopelessness and about a 5 for anxiety. Patient denies having any suicidal ideations and reports he will be going back to his apartment where his family will check in on him. Patient stated he is looking forward to going back to work.   [facility] LCSW Group Therapy  05/01/2012 1:15 PM  Type of Therapy:  Group Therapy  Participation Level:  Active  Participation Quality:  Attentive, Intrusive and Sharing  Affect:  Anxious  Cognitive:  Appropriate  Insight:  Limited  Engagement in Therapy:  Engaged  Modes of Intervention:  Discussion and Support  Summary of Progress/Problems: Patient was initially disrespectful in group session focused on relapse prevention in overcoming obstacles, but as group continued, patient did much better job of participation and focus. Patient reported an obstacle to him and something that will keep him drinking instead of trying to get well. After some discussion, patient admitted he needed grief counseling. Patient was asked to switch roles with his deceased daughter and the patient became much more insightful as to what he needs to do to improve his life.  Patton Salles LCSW 05/01/2012 6:41 AM

## 2012-05-01 NOTE — Progress Notes (Signed)
D. Met with patient 3 times today to discuss possible discharge plans for today as patient was not sure if his stepfather could pick him up.  At first arranged for patient to go home by train or bus if stepfather could not pick up and requested patient attempt to contact stepfather to see if he would be coming to pick him up today.  Patient initially with no SI/HI but irritated staff "are questioning me about my plans".  Later in the evening returned to assess if patient had spoken with his stepfather when he began to report to this RN "I just want to leave and for you all to give me my medication out of my locker".  Patient again was irritated feeling staff were questioning  His sincerity in planning discharge and then stated "It's just too much" and "I just want to go out of here and kill myself".  Discussed concern then patient with SI thoughts and stated plan to take his medications in his locker.  Arranged for patient to be reevaluated by Alvy Beal, NP due to [facility] statements and discussed with Ms. Luciana Axe.  Met with patient with Berneice Heinrich, RN to arrange to attempt to contact patient's sister for his stepfather's number as patient reported he was not trying to hold up discharge but just did not want his stepfather showing up at [facility] with him already discharged and then "he'd be pissed".  A. Informed patient he would be reevaluated by NP as due to SI with plan now stated he may not be discharged and then staff could work with patient to help contact his stepfather to set up arranged discharge tomorrow if patient then with agreed upon plan and no SI.  Patient contracted for safety at present and went with Berneice Heinrich, RN to attempt to contact his sister to then talk with Stepfather and to put a plan in place for possible discharge on tomorrow.  Met with Shavon Rankin, NP again to make sure she was going to see patient again to assess for already ordered discharge and possibly stopping discharge until a plan could be  put in place with patient's stepfather.  R. Patient in agreement with current plan to be reevaluated by NP, possible continued stay at [facility] but to work with RN and staff to contact his sister and stepfather to set up transportation and discharge with patient with no SI/HI.  Informed Lydia Guiles, RN and [facility] of patient's plan and to make sure NP reevaluates further tonight.  Staff will work with patient to set up later discharge with stepfather as he requests or to look into possible longer term care needed possibly with [facility] if remains with +SI and plan.

## 2012-05-01 NOTE — Progress Notes (Signed)
Psychoeducational Group Note  Date:  05/01/2012 Time:  2000  Group Topic/Focus:  Wrap-Up Group:   The focus of this group is to help patients review their daily goal of treatment and discuss progress on daily workbooks.  Participation Level:  Active  Participation Quality:  Appropriate  Affect:  Appropriate  Cognitive:  Appropriate  Insight:  Developing/Improving  Engagement in Group:  Developing/Improving  Additional Comments:  Patient stated he had a good day. Patient stated that his goal for tomorrow is to have a positive day, attend all groups, and think about his daughter in a positive way.   Lyndee Hensen 05/01/2012, 9:34 PM

## 2012-05-01 NOTE — Progress Notes (Signed)
[facility] Adult Case Management Discharge Plan :  Will you be returning to the same living situation after discharge: Yes,    At discharge, do you have transportation home?:Yes,    Do you have the ability to pay for your medications:Yes,     Release of information consent forms completed and in the chart;  Patient's signature needed at discharge.  Patient to Follow up at: Follow-up Information    Follow up with [facility]. (Patient is to go to [facility] walk-in clinic the day after his discharge with clinic opened from 9 AM to 3 PM Monday through Friday)    Contact information:   [address] [place], [place] [address] [phone]         Patient denies SI/HI:   Yes,       Safety Planning and Suicide Prevention discussed:  Cecelia Byars 05/01/2012, 10:56 AM

## 2012-05-01 NOTE — Discharge Summary (Signed)
Physician Discharge Summary Note  Patient:  [name] [name] is an 31 y.o., male MRN:  [id number] DOB:  1981/03/18 Patient phone:  [phone] (home)  Patient address:   [address] [place] [address],   Date of Admission:  04/20/2012 Date of Discharge: 05/01/2012  Reason for Admission:  Depression with suicidal ideations  Discharge Diagnoses: Active Problems:  * No active hospital problems. *   Review of Systems  Constitutional: Negative.   HENT: Negative.   Eyes: Negative.   Respiratory: Negative.   Cardiovascular: Negative.   Gastrointestinal: Negative.   Genitourinary: Negative.   Musculoskeletal: Negative.   Skin: Negative.   Neurological: Negative.   Endo/Heme/Allergies: Negative.   Psychiatric/Behavioral: Positive for depression and substance abuse.   Axis Diagnosis:   AXIS I:  Major Depression, Recurrent severe, Substance Abuse and Substance Induced Mood Disorder AXIS II:  Deferred AXIS III:   Past Medical History  Diagnosis Date  . Seizure   . Protein C deficiency   . Protein C deficiency    AXIS IV:  economic problems, occupational problems, other psychosocial or environmental problems, problems related to social environment and problems with primary support group AXIS V:  61-70 mild symptoms  Level of Care:  OP  Hospital Course:   1-Individual and group therapy 2-Medication management for depression and anxiety; maintenance of antiepileptic medications, no seizures noted since his return from the medical hospital. 3-Coping skills for depression, anxiety, and bereavement developed 4-Patient is stable and denies suicidal/homicidal ideations and hallucinations, Rx and 14 day supply of medications given at discharge, awaiting his family to arrive for transport home to [place], [place] 5-Discharge plan in place to prevent depression relapse 6-Psycho-education regarding relapse prevention and plan in place if his depression increases at home (contact family,  go visit his sister,...)   Consults:  None  Significant Diagnostic Studies:  labs: Completed and reviewed, stable  Discharge Vitals:   Blood pressure 135/93, pulse 76, temperature 97.2 F (36.2 C), temperature source Oral, resp. rate 18, height 5\' 8"  (1.727 m), weight 72.576 kg (160 lb). Body mass index is 24.33 kg/(m^2). Lab Results:   No results found for this or any previous visit (from the past 72 hour(s)).  Physical Findings: AIMS: Facial and Oral Movements Muscles of Facial Expression: None, normal Lips and Perioral Area: None, normal Jaw: None, normal Tongue: None, normal,Extremity Movements Upper (arms, wrists, hands, fingers): None, normal Lower (legs, knees, ankles, toes): None, normal, Trunk Movements Neck, shoulders, hips: None, normal, Overall Severity Severity of abnormal movements (highest score from questions above): None, normal Incapacitation due to abnormal movements: None, normal Patient's awareness of abnormal movements (rate only patient's report): No Awareness, Dental Status Current problems with teeth and/or dentures?: No Does patient usually wear dentures?: No  CIWA:  CIWA-Ar Total: 4  COWS:  COWS Total Score: 2   Psychiatric Specialty Exam: See Psychiatric Specialty Exam and Suicide Risk Assessment completed by Attending Physician prior to discharge.  Discharge destination:  Home  Is patient on multiple antipsychotic therapies at discharge:  No   Has Patient had three or more failed trials of antipsychotic monotherapy by history:  No Recommended Plan for Multiple Antipsychotic Therapies:  No   Discharge Orders    Future Orders Please Complete By Expires   Diet - low sodium heart healthy      Diet - low sodium heart healthy      Activity as tolerated - No restrictions      Activity as tolerated - No restrictions  Medication List     As of 05/01/2012 11:59 AM    TAKE these medications      Indication    DSS 100 MG Caps   Take 100  mg by mouth 2 (two) times daily.    Indication: Constipation      folic acid 1 MG tablet   Commonly known as: FOLVITE   Take 1 tablet (1 mg total) by mouth daily.    Indication: Deficiency of Folic Acid in the Diet      Lacosamide 150 MG Tabs   Take 2 tablets (300 mg total) by mouth 2 (two) times daily.    Indication: Partial Onset Seizures      levETIRAcetam 1000 MG tablet   Commonly known as: KEPPRA   Take 2 tablets (2,000 mg total) by mouth every 12 (twelve) hours.    Indication: Tonic-Clonic Seizures      LORazepam 0.5 MG tablet   Commonly known as: ATIVAN   Take 1 tablet (0.5 mg total) by mouth 2 (two) times daily.    Indication: Status Epilepticus      morphine 30 MG tablet   Commonly known as: MSIR   Take 1 tablet (30 mg total) by mouth every 8 (eight) hours as needed.    Indication: Moderate to Severe Acute Pain, Moderate to Severe Chronic Pain      PHENobarbital 64.8 MG tablet   Commonly known as: LUMINAL   Take 1 tablet (64.8 mg total) by mouth at bedtime.    Indication: Simple Partial Seizures      phenytoin 200 MG ER capsule   Commonly known as: DILANTIN   Take 1 capsule (200 mg total) by mouth 2 (two) times daily.    Indication: Seizure      risperiDONE 0.5 MG tablet   Commonly known as: RISPERDAL   Take 1 tablet (0.5 mg total) by mouth 2 (two) times daily.    Indication: Easily Angered or Annoyed      Rivaroxaban 20 MG Tabs   Commonly known as: XARELTO   Take 1 tablet (20 mg total) by mouth every evening.    Indication: thrombosis prevention      sertraline 100 MG tablet   Commonly known as: ZOLOFT   Take 1 tablet (100 mg total) by mouth daily.    Indication: depression      thiamine 100 MG tablet   Take 1 tablet (100 mg total) by mouth daily.    Indication: vitamin deficiency      topiramate 100 MG tablet   Commonly known as: TOPAMAX   Take 1 tablet (100 mg total) by mouth 2 (two) times daily.    Indication: Partial Onset Seizures             Follow-up Information    Follow up with [facility]. (Patient is to go to the [facility] walk-in clinic at 8:45 AM on Monday, 05/04/2012)    Contact information:   [address] [place], [place] [address] [phone]         Follow-up recommendations:  Activity as tolerated, low-sodium heart healthy diet  Comments:  Patient will discharge to his family and return to his apartment in [place], [place] and follow-up with counseling.  Total Discharge Time:  Greater than 30 minutes  Signed: [name], PMH-NP 05/01/2012, 11:59 AM

## 2012-05-01 NOTE — [facility] Suicide Risk Assessment (Signed)
Suicide Risk Assessment  Discharge Assessment     Demographic Factors:  Male, Caucasian, Living alone and Unemployed  Mental Status Per Nursing Assessment::   On Admission:  Suicidal ideation indicated by patient;Suicide plan  Current Mental Status by Physician: Patient alert and oriented. Denies aH/VH/SI/HI.  Loss Factors: Loss of significant relationship and Financial problems/change in socioeconomic status  Historical Factors: Impulsivity  Risk Reduction Factors:   Positive social support  Continued Clinical Symptoms:  Depression:   Recent sense of peace/wellbeing  Cognitive Features That Contribute To Risk:  Thought constriction (tunnel vision)    Suicide Risk:  Minimal: No identifiable suicidal ideation.  Patients presenting with no risk factors but with morbid ruminations; may be classified as minimal risk based on the severity of the depressive symptoms  Discharge Diagnoses:   AXIS I:  Depressive Disorder NOS AXIS II:  Deferred AXIS III:   Past Medical History  Diagnosis Date  . Seizure   . Protein C deficiency   . Protein C deficiency    AXIS IV:  housing problems and occupational problems AXIS V:  61-70 mild symptoms  Plan Of Care/Follow-up recommendations:  Activity:  normal Diet:  normal  Is patient on multiple antipsychotic therapies at discharge:  No   Has Patient had three or more failed trials of antipsychotic monotherapy by history:  No  Recommended Plan for Multiple Antipsychotic Therapies: NA  Tola Meas 05/01/2012, 10:44 AM

## 2012-05-02 DIAGNOSIS — F4321 Adjustment disorder with depressed mood: Secondary | ICD-10-CM | POA: Diagnosis present

## 2012-05-02 MED ORDER — NICOTINE POLACRILEX 2 MG MT GUM
2.0000 mg | CHEWING_GUM | OROMUCOSAL | Status: DC | PRN
  Administered 2012-05-02 – 2012-05-13 (×121): 2 mg via ORAL
  Filled 2012-05-02 (×2): qty 1

## 2012-05-02 NOTE — Clinical Social Work Psychosocial (Signed)
[facility] Group Notes:  (Clinical Social Work)  05/02/2012   3:00-4:00PM  Summary of Progress/Problems:   The main focus of today's process group was for the patient to identify ways in which they have in the past sabotaged their own recovery and reasons they may have done this/what they received from doing it.  We then worked to identify a specific plan to avoid doing this when discharged from the hospital for this admission.  The patient expressed that his self-sabotaging behaviors are mostly (1) isolation, (2) clowning around to act OK, (3) not cleaning out his daughter's room or allowing anyone else to help him, (4) drinking.  Type of Therapy:  Group Therapy - Process  Participation Level:  Active  Participation Quality:  Attentive  Affect:  Appropriate and Blunted  Cognitive:  Oriented  Insight:  Limited  Engagement in Therapy:  Limited  Modes of Intervention:  Clarification, Education, Limit-setting, Problem-solving, Socialization, Support and Processing, Exploration, Discussion   Ambrose Mantle, LCSW 05/02/2012, 4:50 PM

## 2012-05-02 NOTE — Progress Notes (Signed)
D-Patient active on the unit and attending the scheduled groups. Rates his depression/hopelessness at 7. Endorses passive SI but contracts. Patient remains focused on getting his pain medications and nicotine gum as often as possible. Patient became tearful after meeting with provider stating "Whenever I talk about my daughter I start crying."  A-Patient receptive to emotional support. He does smile occasionally during conversation.  R-Patient plans to go to grief counseling after discharge. Continue to manage chronic leg pain and ensure safety on the unit.

## 2012-05-02 NOTE — Progress Notes (Signed)
[facility] MD Progress Note  05/02/2012 3:39 PM [name] [name]  MRN:  [id number] Subjective:  Says he gets suicidal when he thinks about discharge. Has to clean out his daughter's room at the apartment and every time he thinks about that he procrastinates. Can ask his sister [name] [name] and his stepdad to do this.He what items he wants to save give away etc. Just can't bring himself to do it.  Talked to him about survivors guilt and how he should do the the Grief Recovery Therapy.     Axis I: Bereavement and Depressive Disorder NOS Axis II: Deferred Axis III:  Past Medical History  Diagnosis Date  . Seizure   . Protein C deficiency   . Protein C deficiency    Axis IV: housing problems and occupational problems Axis V: 51-60 moderate symptoms  ADL's:  Intact  Sleep: Fair  Appetite:  Fair    Psychiatric Specialty Exam: Review of Systems  Constitutional: Negative.   HENT: Negative.   Eyes: Negative.   Respiratory: Negative.   Cardiovascular: Negative.   Gastrointestinal: Negative.   Genitourinary: Negative.   Musculoskeletal: Negative.   Skin: Negative.   Neurological: Negative.   Endo/Heme/Allergies: Negative.   Psychiatric/Behavioral: Positive for depression.    Blood pressure 123/85, pulse 74, temperature 97.4 F (36.3 C), temperature source Oral, resp. rate 20, height 5\' 8"  (1.727 m), weight 72.576 kg (160 lb).Body mass index is 24.33 kg/(m^2).  General Appearance: Disheveled  Eye Solicitor::  Fair  Speech:  Clear and Coherent  Volume:  Normal  Mood:  Dysphoric  Affect:  Full Range  Thought Process:  Coherent  Orientation:  Full (Time, Place, and Person)  Thought Content:  WDL  Suicidal Thoughts:  No  Homicidal Thoughts:  No  Memory:  Immediate;   Fair Recent;   Fair Remote;   Fair  Judgement:  Fair  Insight:  Fair  Psychomotor Activity:  Normal  Concentration:  Fair  Recall:  Fair  Akathisia:  No  Handed:  Right  AIMS (if indicated):     Assets:  Communication  Skills Desire for Improvement  Sleep:  Number of Hours: 5.5    Current Medications: Current Facility-Administered Medications  Medication Dose Route Frequency Provider Last Rate Last Dose  . acetaminophen (TYLENOL) tablet 650 mg  650 mg Oral Q6H PRN Rachael Fee, MD      . alum & mag hydroxide-simeth (MAALOX/MYLANTA) 200-200-20 MG/5ML suspension 30 mL  30 mL Oral Q4H PRN Rachael Fee, MD      . docusate sodium (COLACE) capsule 100 mg  100 mg Oral BID Rachael Fee, MD   100 mg at 05/02/12 4098  . folic acid (FOLVITE) tablet 1 mg  1 mg Oral Daily Rachael Fee, MD   1 mg at 05/02/12 1191  . lacosamide (VIMPAT) tablet 300 mg  300 mg Oral BID Rachael Fee, MD   300 mg at 05/02/12 4782  . levETIRAcetam (KEPPRA) tablet 2,000 mg  2,000 mg Oral BID Rachael Fee, MD   2,000 mg at 05/02/12 9562  . LORazepam (ATIVAN) tablet 0.5 mg  0.5 mg Oral BID Rachael Fee, MD   0.5 mg at 05/02/12 1308  . magnesium hydroxide (MILK OF MAGNESIA) suspension 30 mL  30 mL Oral Daily PRN Rachael Fee, MD      . morphine (MSIR) tablet 30 mg  30 mg Oral Q8H PRN [name], NP   30 mg at 05/02/12 1447  . nicotine polacrilex (  NICORETTE) gum 2 mg  2 mg Oral Q1H PRN Mickie D. Jaylani Mcguinn, PA   2 mg at 05/02/12 1521  . PHENobarbital (LUMINAL) tablet 64.8 mg  64.8 mg Oral QHS Rachael Fee, MD   64.8 mg at 05/01/12 2119  . phenytoin (DILANTIN) ER capsule 200 mg  200 mg Oral BID Rachael Fee, MD   200 mg at 05/02/12 1478  . risperiDONE (RISPERDAL) tablet 0.5 mg  0.5 mg Oral BID Himabindu Ravi, MD   0.5 mg at 05/02/12 2956  . Rivaroxaban (XARELTO) tablet 20 mg  20 mg Oral Q supper Rachael Fee, MD   20 mg at 05/01/12 1658  . sertraline (ZOLOFT) tablet 100 mg  100 mg Oral Daily Himabindu Ravi, MD   100 mg at 05/02/12 2130  . thiamine (VITAMIN B-1) tablet 100 mg  100 mg Oral Daily Rachael Fee, MD   100 mg at 05/02/12 8657  . topiramate (TOPAMAX) tablet 100 mg  100 mg Oral BID Rachael Fee, MD   100 mg at 05/02/12 8469     Lab Results: No results found for this or any previous visit (from the past 48 hour(s)).  Physical Findings: AIMS: Facial and Oral Movements Muscles of Facial Expression: None, normal Lips and Perioral Area: None, normal Jaw: None, normal Tongue: None, normal,Extremity Movements Upper (arms, wrists, hands, fingers): None, normal Lower (legs, knees, ankles, toes): None, normal, Trunk Movements Neck, shoulders, hips: None, normal, Overall Severity Severity of abnormal movements (highest score from questions above): None, normal Incapacitation due to abnormal movements: None, normal Patient's awareness of abnormal movements (rate only patient's report): No Awareness, Dental Status Current problems with teeth and/or dentures?: No Does patient usually wear dentures?: No  CIWA:  CIWA-Ar Total: 4  COWS:  COWS Total Score: 2   Treatment Plan Summary: Daily contact with patient to assess and evaluate symptoms and progress in treatment Medication management  Plan: Continue current plan of care.   Medical Decision Making Problem Points:  Established problem, stable/improving (1), Review of last therapy session (1) and Review of psycho-social stressors (1) Data Points:  Review of medication regiment & side effects (2)  I certify that inpatient services furnished can reasonably be expected to improve the patient's condition.   Aubriella Perezgarcia,MICKIE D.RPA-C CAQ-Psych  05/02/2012, 3:39 PM

## 2012-05-03 NOTE — Progress Notes (Signed)
[facility] Group Notes:  (Counselor/Nursing/MHT/Case Management/Adjunct)  05/03/2012 1:55 AM  Type of Therapy:  wrap up group  Participation Level:  Minimal  Participation Quality:  Drowsy  Affect:  Flat  Cognitive:  Disorganized  Insight:  Limited  Engagement in Group:  Poor  Engagement in Therapy:  unkown  Modes of Intervention:  Discussion and Support  Summary of Progress/Problems:  At the end of group patient's were asked to give one goal, either short or long term.  Patient stated that his goal was to , "work on my grief when I get home - go to therapy".   [name] [name] 05/03/2012, 1:55 AM

## 2012-05-03 NOTE — Progress Notes (Signed)
Patient ID: [name] [name], male   DOB: 1980/10/14, 31 y.o.   MRN: [id number] D: Pt is awake and active on the unit this AM. Pt endorses passive SI but he is able to contract for safety. Pt is participating in the milieu and is cooperative with staff. Pt rates their depression at 6 and hopelessness at 5. Pt mood is depressed/irritable and his affect is anxious. Pt writes that he wishes to participate in grief counseling after discharge. Pt is somewhat irritable this AM and can be demanding but he is redirectable.  A: Writer utilized therapeutic communication, encouraged pt to discuss feelings with staff and administered medication per MD orders. Writer also encouraged pt to attend groups and he is participating.   R: Pt is attending groups and tolerating medications well. Writer will continue to monitor. 15 minute checks are ongoing for safety.

## 2012-05-03 NOTE — Progress Notes (Signed)
Psychoeducational Group Note  Date:  05/03/2012 Time:  0945  Group Topic/Focus:  Self Inventory  Participation Level:  Active  Participation Quality:  Appropriate  Affect:  Depressed and Irritable  Cognitive:  Appropriate  Insight:  Engaged  Engagement in Group:  Engaged  Additional Comments:    Eloyse Causey Ivan 05/03/2012, 10:45 AM

## 2012-05-03 NOTE — Progress Notes (Signed)
Writer observed patient up in the dayroom interacting with peers, patient attended group and participated. Patient came to medication window and requested nicotine gum. Writer inquired as to what his plans are after discharge and he reports that he plans to return to his apartment and will eventually go back to work. Patient plans to attend grief counseling also. Patient reports that he is always in pain and is normally an 8 but once he receives pain medication it normally drops it down to a 4. Patient reports passive si and verbally contracts for safety, Patient denies hi/a/v hallucinations. Support and encouragement offered, safety maintained on unit, will continue to monitor.

## 2012-05-03 NOTE — Clinical Social Work Psychosocial (Signed)
[facility] Group Notes:  (Clinical Social Work)  05/03/2012   3:00-4:00PM  Summary of Progress/Problems:   Summary of Progress/Problems:   The main focus of today's process group was for the patient to define "support" and describe what healthy supports are, then to identify the patient's current support system and decide on other supports that can be put in place to prevent future hospitalizations.  Roleplay was used to demonstrate definitions of different types of available supports.  An emphasis was placed on using therapist, doctor, therapy groups, self-help groups and problem-specific support groups to expand supports. Additionally, psychoeducation on various symptoms of mental illness was done at patients' requests.  The patient expressed that his co-workers and his family are supports, as well as his medical providers (particularly neurologist).  He has also found out about a support group called [facility] in [place], where he lives, that is specifically for people who have lost a child, and is excited to start attending that because he wants to be around people who have experienced what he is experiencing now.  He was the volunteer who assisted in the demonstration of the definitions of "support."  Type of Therapy:  Process Group  Participation Level:  Active  Participation Quality:  Appropriate, Attentive, Sharing and Supportive  Affect:  Blunted  Cognitive:  Alert, Appropriate and Oriented  Insight:  Engaged  Engagement in Therapy:  Engaged  Modes of Intervention:  Clarification, Education, Limit-setting, Problem-solving, Socialization, Support and Processing, Exploration, Discussion, Role-Play   Ambrose Mantle, LCSW 05/03/2012, 4:11 PM

## 2012-05-03 NOTE — Progress Notes (Signed)
[facility] MD Progress Note  05/03/2012 1:29 PM [name] [name]  MRN:  [id number] Subjective:  Wants to discuss how to clean out daughters room and things with the weekly staff. His sister and stepdad are coming today to visit and also plan to be at the family session tomorrow. He says he knows he has to "get his act together - needs to go back to work etc"     Axis I: Bereavement and Depressive Disorder NOS Axis II: Deferred Axis III:  Past Medical History  Diagnosis Date  . Seizure   . Protein C deficiency   . Protein C deficiency    Axis IV: housing problems and occupational problems Axis V: 51-60 moderate symptoms  ADL's:  Intact  Sleep: Fair  Appetite:  Fair    Psychiatric Specialty Exam: Review of Systems  Constitutional: Negative.   HENT: Negative.   Eyes: Negative.   Respiratory: Negative.   Cardiovascular: Negative.   Gastrointestinal: Negative.   Genitourinary: Negative.   Musculoskeletal: Negative.   Skin: Negative.   Neurological: Negative.   Endo/Heme/Allergies: Negative.   Psychiatric/Behavioral: Positive for depression.    Blood pressure 124/84, pulse 77, temperature 97.2 F (36.2 C), temperature source Oral, resp. rate 16, height 5\' 8"  (1.727 m), weight 72.576 kg (160 lb).Body mass index is 24.33 kg/(m^2).  General Appearance: Disheveled wears a hoodie   Patent attorney::  Fair  Speech:  Clear and Coherent  Volume:  Normal  Mood:  Dysphoric  Affect:  Full Range  Thought Process:  Coherent  Orientation:  Full (Time, Place, and Person)  Thought Content:  WDL  Suicidal Thoughts:  No  Homicidal Thoughts:  No  Memory:  Immediate;   Fair Recent;   Fair Remote;   Fair  Judgement:  Fair  Insight:  Fair  Psychomotor Activity:  Normal  Concentration:  Fair  Recall:  Fair  Akathisia:  No  Handed:  Right  AIMS (if indicated):     Assets:  Communication Skills Desire for Improvement  Sleep:  Number of Hours: 4    Current Medications: Current  Facility-Administered Medications  Medication Dose Route Frequency Provider Last Rate Last Dose  . acetaminophen (TYLENOL) tablet 650 mg  650 mg Oral Q6H PRN Rachael Fee, MD      . alum & mag hydroxide-simeth (MAALOX/MYLANTA) 200-200-20 MG/5ML suspension 30 mL  30 mL Oral Q4H PRN Rachael Fee, MD      . docusate sodium (COLACE) capsule 100 mg  100 mg Oral BID Rachael Fee, MD   100 mg at 05/03/12 0807  . folic acid (FOLVITE) tablet 1 mg  1 mg Oral Daily Rachael Fee, MD   1 mg at 05/03/12 1100  . lacosamide (VIMPAT) tablet 300 mg  300 mg Oral BID Rachael Fee, MD   300 mg at 05/03/12 4098  . levETIRAcetam (KEPPRA) tablet 2,000 mg  2,000 mg Oral BID Rachael Fee, MD   2,000 mg at 05/03/12 1100  . LORazepam (ATIVAN) tablet 0.5 mg  0.5 mg Oral BID Rachael Fee, MD   0.5 mg at 05/03/12 0807  . magnesium hydroxide (MILK OF MAGNESIA) suspension 30 mL  30 mL Oral Daily PRN Rachael Fee, MD      . morphine (MSIR) tablet 30 mg  30 mg Oral Q8H PRN [name], NP   30 mg at 05/03/12 0646  . nicotine polacrilex (NICORETTE) gum 2 mg  2 mg Oral Q1H PRN Mickie D. Pernell Dupre, [place]  2 mg at 05/03/12 0809  . PHENobarbital (LUMINAL) tablet 64.8 mg  64.8 mg Oral QHS Rachael Fee, MD   64.8 mg at 05/02/12 2113  . phenytoin (DILANTIN) ER capsule 200 mg  200 mg Oral BID Rachael Fee, MD   200 mg at 05/03/12 2956  . risperiDONE (RISPERDAL) tablet 0.5 mg  0.5 mg Oral BID Himabindu Ravi, MD   0.5 mg at 05/03/12 0807  . Rivaroxaban (XARELTO) tablet 20 mg  20 mg Oral Q supper Rachael Fee, MD   20 mg at 05/02/12 1704  . sertraline (ZOLOFT) tablet 100 mg  100 mg Oral Daily Himabindu Ravi, MD   100 mg at 05/03/12 0807  . thiamine (VITAMIN B-1) tablet 100 mg  100 mg Oral Daily Rachael Fee, MD   100 mg at 05/03/12 2130  . topiramate (TOPAMAX) tablet 100 mg  100 mg Oral BID Rachael Fee, MD   100 mg at 05/03/12 8657    Lab Results: No results found for this or any previous visit (from the past 48  hour(s)).  Physical Findings: AIMS: Facial and Oral Movements Muscles of Facial Expression: None, normal Lips and Perioral Area: None, normal Jaw: None, normal Tongue: None, normal,Extremity Movements Upper (arms, wrists, hands, fingers): None, normal Lower (legs, knees, ankles, toes): None, normal, Trunk Movements Neck, shoulders, hips: None, normal, Overall Severity Severity of abnormal movements (highest score from questions above): None, normal Incapacitation due to abnormal movements: None, normal Patient's awareness of abnormal movements (rate only patient's report): No Awareness, Dental Status Current problems with teeth and/or dentures?: No Does patient usually wear dentures?: No  CIWA:  CIWA-Ar Total: 4  COWS:  COWS Total Score: 2   Treatment Plan Summary: Daily contact with patient to assess and evaluate symptoms and progress in treatment Medication management  Plan: Continue current plan of care. He plans to ask family members for help sorting daughters things.   Medical Decision Making Problem Points:  Established problem, stable/improving (1), Review of last therapy session (1) and Review of psycho-social stressors (1) Data Points:  Review of medication regiment & side effects (2)  I certify that inpatient services furnished can reasonably be expected to improve the patient's condition.   Nuel Dejaynes,MICKIE D.RPA-C CAQ-Psych  05/03/2012, 1:29 PM

## 2012-05-04 MED ORDER — MORPHINE SULFATE 15 MG PO TABS
15.0000 mg | ORAL_TABLET | Freq: Three times a day (TID) | ORAL | Status: DC | PRN
  Administered 2012-05-04 – 2012-05-06 (×6): 15 mg via ORAL
  Filled 2012-05-04 (×6): qty 1

## 2012-05-04 NOTE — Progress Notes (Signed)
[facility] MD Progress Note  05/04/2012 11:34 AM [name] [name]  MRN:  [id number] Subjective:  Patient participated in treatment team today. He states feeling suicidal when thinking about discharge and this led him to not be discharged this past Friday. Patient endorsing vague suicidal thoughts and sadness at loss of daughter. He is observed to be joking on the unit and has been very demanding with clinical staff. He is evasive when asked about his family who were supposed to pick him up. He denied permission for clinical staff to contact his family. During treatment team, it was discussed that patient`s medications regimen will be altered to help him with his mood and his depression. Patient became very upset that his pain medications are going to be altered. Patient did not want to address his depression or a plan to help him become functional. Focused on pain medications.  Diagnosis:   Axis I: Depressive Disorder NOS and Substance Induced Mood Disorder Axis II: Deferred Axis III:  Past Medical History  Diagnosis Date  . Seizure   . Protein C deficiency   . Protein C deficiency    Axis IV: economic problems, occupational problems and other psychosocial or environmental problems Axis V: 51-60 moderate symptoms  ADL's:  Intact  Sleep: Fair  Appetite:  Fair   Psychiatric Specialty Exam: Review of Systems  Constitutional: Negative.   HENT: Negative.   Eyes: Negative.   Respiratory: Negative.   Cardiovascular: Negative.   Gastrointestinal: Negative.   Genitourinary: Negative.   Musculoskeletal: Negative.   Skin: Negative.   Neurological: Positive for seizures.  Endo/Heme/Allergies: Negative.   Psychiatric/Behavioral: Positive for depression and suicidal ideas.    Blood pressure 114/74, pulse 88, temperature 97.1 F (36.2 C), temperature source Oral, resp. rate 16, height 5\' 8"  (1.727 m), weight 72.576 kg (160 lb).Body mass index is 24.33 kg/(m^2).  General Appearance: Disheveled    Eye Solicitor::  Fair  Speech:  Clear and Coherent  Volume:  Normal  Mood:  Dysphoric and Irritable  Affect:  Constricted  Thought Process:  Coherent  Orientation:  Full (Time, Place, and Person)  Thought Content:  Rumination  Suicidal Thoughts:  Yes.  with intent/plan  Homicidal Thoughts:  No  Memory:  Immediate;   Fair Recent;   Fair Remote;   Fair  Judgement:  Poor  Insight:  Present  Psychomotor Activity:  Normal  Concentration:  Fair  Recall:  Fair  Akathisia:  No  Handed:  Right  AIMS (if indicated):     Assets:  Communication Skills  Sleep:  Number of Hours: 5    Current Medications: Current Facility-Administered Medications  Medication Dose Route Frequency Provider Last Rate Last Dose  . acetaminophen (TYLENOL) tablet 650 mg  650 mg Oral Q6H PRN Rachael Fee, MD      . alum & mag hydroxide-simeth (MAALOX/MYLANTA) 200-200-20 MG/5ML suspension 30 mL  30 mL Oral Q4H PRN Rachael Fee, MD      . docusate sodium (COLACE) capsule 100 mg  100 mg Oral BID Rachael Fee, MD   100 mg at 05/04/12 0747  . folic acid (FOLVITE) tablet 1 mg  1 mg Oral Daily Rachael Fee, MD   1 mg at 05/04/12 0746  . lacosamide (VIMPAT) tablet 300 mg  300 mg Oral BID Rachael Fee, MD   300 mg at 05/04/12 0748  . levETIRAcetam (KEPPRA) tablet 2,000 mg  2,000 mg Oral BID Rachael Fee, MD   2,000 mg at 05/04/12 0747  .  LORazepam (ATIVAN) tablet 0.5 mg  0.5 mg Oral BID Rachael Fee, MD   0.5 mg at 05/04/12 0746  . magnesium hydroxide (MILK OF MAGNESIA) suspension 30 mL  30 mL Oral Daily PRN Rachael Fee, MD      . morphine (MSIR) tablet 30 mg  30 mg Oral Q8H PRN [name], NP   30 mg at 05/04/12 1478  . nicotine polacrilex (NICORETTE) gum 2 mg  2 mg Oral Q1H PRN Mickie D. Adams, PA   2 mg at 05/04/12 0900  . PHENobarbital (LUMINAL) tablet 64.8 mg  64.8 mg Oral QHS Rachael Fee, MD   64.8 mg at 05/03/12 2106  . phenytoin (DILANTIN) ER capsule 200 mg  200 mg Oral BID Rachael Fee, MD   200 mg at  05/04/12 0745  . risperiDONE (RISPERDAL) tablet 0.5 mg  0.5 mg Oral BID Skarlet Lyons, MD   0.5 mg at 05/04/12 0748  . Rivaroxaban (XARELTO) tablet 20 mg  20 mg Oral Q supper Rachael Fee, MD   20 mg at 05/03/12 1736  . sertraline (ZOLOFT) tablet 100 mg  100 mg Oral Daily Amee Boothe, MD   100 mg at 05/04/12 0746  . thiamine (VITAMIN B-1) tablet 100 mg  100 mg Oral Daily Rachael Fee, MD   100 mg at 05/04/12 0746  . topiramate (TOPAMAX) tablet 100 mg  100 mg Oral BID Rachael Fee, MD   100 mg at 05/04/12 2956    Lab Results: No results found for this or any previous visit (from the past 48 hour(s)).  Physical Findings: AIMS: Facial and Oral Movements Muscles of Facial Expression: None, normal Lips and Perioral Area: None, normal Jaw: None, normal Tongue: None, normal,Extremity Movements Upper (arms, wrists, hands, fingers): None, normal Lower (legs, knees, ankles, toes): None, normal, Trunk Movements Neck, shoulders, hips: None, normal, Overall Severity Severity of abnormal movements (highest score from questions above): None, normal Incapacitation due to abnormal movements: None, normal Patient's awareness of abnormal movements (rate only patient's report): No Awareness, Dental Status Current problems with teeth and/or dentures?: No Does patient usually wear dentures?: No  CIWA:  CIWA-Ar Total: 4  COWS:  COWS Total Score: 2   Treatment Plan Summary: Daily contact with patient to assess and evaluate symptoms and progress in treatment Medication management  Plan: Will detox detox regimen for morphine with pharmacist and initiate a protocol. Patient has been abusing the morphine and obtaining 90mg  daily (which is scheduled on prn basis). Was observed to be snorting his medication by roommate. Will consult neurologist to ensure patient needs continued phenobarbital. Continue to address discharge options and try to contact patient`s family.  Medical Decision Making Problem  Points:  Established problem, worsening (2), Review of last therapy session (1) and Review of psycho-social stressors (1) Data Points:  Decision to obtain old records (1) Review and summation of old records (2) Review of medication regiment & side effects (2) Review of new medications or change in dosage (2)  I certify that inpatient services furnished can reasonably be expected to improve the patient's condition.   Kymere Fullington 05/04/2012, 11:34 AM

## 2012-05-04 NOTE — Social Work (Signed)
Interdisciplinary Treatment Plan Update (Adult)  Date:  05/04/2012  Time Reviewed:  6:36 AM    Progress in Treatment: Attending groups:   Yes   Participating in groups:  Yes Taking medication as prescribed:  Yes Tolerating medication:  Yes Family/Significant othe contact made: No Patient understands diagnosis:  Yes Discussing patient identified problems/goals with staff: No Medical problems stabilized or resolved: Yes Denies suicidal/homicidal ideation: No -  Patient able to contract for safety Issues/concerns per patient self-inventory:  Other:  New problem(s) identified: Patient was scheduled for discharge Friday and stated when he got time for him to leave, that he realized he was not well enough to leave and was still suicidal. Nursing staff reports patient may be medication seeking and decision was made to bring patient into treatment team.  Reason for Continuation of Hospitalization: Anxiety Depression Medication stabilization Suicidal ideation  Interventions implemented related to continuation of hospitalization:  Medication mgement; safety checks q 15 mins; coping skills development  Additional comments: Patient reported "I was never ready for discharge from the start. I just said that I talk to staff wanted me to say." Patient became tearful when discussing the death of his daughter and his daughter's mother and stated he has not learned how to effectively cope with their deaths. M.D. suggested decreasing patient's pain medications in order to help him with his depression and patient repeatedly argued M.D. saying M.D. was trying to put him in severe pain by decreasing his pain medications. Patient repeatedly confronted M.D. about her lowering his pain meds and was not receptive to what M.D. was saying about helping him to relieve his depression by tweaking his medications.  Estimated length of stay: 3-5 days  Discharge Plan:  Outpatient follow up scheduled  New  goal(s):  Review of initial/current patient goals per problem list:    1.  Goal(s): Eliminate SI/other thoughts of self harm   Met:  No  Target date: d/c  As evidenced by: Patient will no longer endorse SI/HI or other thoughts of self harm.    2.  Goal (s):Reduce depression/anxiety  Met: No  Target date: d/c  As evidenced by: Patient will rate symptoms at four or below    3.  Goal(s):.stabilize on meds   Met:  No  Target date: d/c  As evidenced by: Patient will report being stabilized on medications - less symptomatic    4.  Goal(s): Refer for outpatient follow up   Met:  Yes  Target date: d/c  As evidenced by: Follow up appointment will be scheduled    Attendees: Patient:   @TD  6:36 AM  Physican:  Patrick North, MD @TD  6:36 AM  Nursing:  Neill Loft, RN  05/04/2012 6:36 AM  Nursing:    @TD  6:36 AM  Clinical Social Worker:  Patton Salles, LCSW @TD  6:36 AM  Other: Tera Helper -NP 05/04/2012 6:36 AM   Other:         05/04/2012 6:36 AM Other:

## 2012-05-04 NOTE — Progress Notes (Signed)
Patient has been up in the dayroom interacting with peers. Patient reports that his day has been good so far but still has passive si and verbally contracts. Patient voiced no complaints and is aware of the time due for his next dose of pain medication. Support and encouragement offered to patient concerning his plans for discharge. Patient denies hi/a/v hallucinations. Safety maintained on unit, will continue to monitor.

## 2012-05-04 NOTE — Progress Notes (Signed)
[facility] LCSW Group Therapy  05/04/2012 4:31 PM  Type of Therapy:  Group Therapy  Participation Level:  Active  Participation Quality:  Appropriate and Attentive  Affect:  Blunted and Flat  Cognitive:  Alert and Appropriate  Insight:  Developing/Improving  Engagement in Therapy:  Engaged  Modes of Intervention:  Confrontation, Discussion, Exploration and Problem-solving  Summary of Progress/Problems:  Today's group was about overcoming obstacles and discussion personal obstacles, how one is motivated to address what is holding them back and naming an obstacle and one way to move past it.  [name] [name] was able to report his biggest obstacle and what he knows he has to deal with is the death of his daughter and his procrastination towards this grief. He reports he going to try therapy, specifically grief to work towards dealing with his daughters death.  He provides insight as to why he has not death with this because when he does have to clean out her bedroom then it is real and his to relive the whole situation over again. He reports he has a good support network and good help to overcoming this main obstacle in his life.   [name] [name], [name] [name] 05/04/2012, 4:31 PM

## 2012-05-04 NOTE — Progress Notes (Signed)
Neurology consult for medication recommendations for weaning patient safely from morphine.  He suggested halfing his current dosage for a few days before further reductions to prevent seizures.  Information relayed to Dr Daleen Bo.

## 2012-05-04 NOTE — Progress Notes (Signed)
[facility] Group Notes:  (Counselor/Nursing/MHT/Case Management/Adjunct)  05/04/2012 8:05PM  Type of Therapy:  Psychoeducational Skills  Participation Level:  None  Participation Quality:  Drowsy and Inattentive  Affect:  Flat  Cognitive:  Appropriate  Insight:  None  Engagement in Group:  None  Engagement in Therapy:  None  Modes of Intervention:  Wrap-Up Group  Summary of Progress/Problems: Pt was in wrap-up group, but was asleep the entire time.  Kayl Stogdill K 05/04/2012, 9:36 PM

## 2012-05-04 NOTE — Social Work (Signed)
Pt attended discharge planning group and minimally participated in group.  CSW provided pt with today's workbook. Suicide prevention and education were also discussed in today's group. Patient reported that he did not discharge on Friday because he realized "I was in a bad place, had too much to do with concerning my daughter, and I'm just not ready to go". Patient reports having off and on suicidal ideations and rated himself as 6 for depression4 for anxiety, and finds her helplessness and hopelessness.

## 2012-05-04 NOTE — Progress Notes (Signed)
D:Pt has a flat/sad affect and reports passive si with no specific plan.  Pt reports that his pain has decreased to a 2 since receiving morphine. Pt receiving nicotine gum as requested.  A:Offered encouragement and supported pt to discuss feelings. Gave medications as ordered and 15 minute checks. R:Pt contracts with staff for safety.

## 2012-05-04 NOTE — Progress Notes (Signed)
Pt tearful and agitated following treatment team meeting. Pt focused on pain medications and stated that he would get more pain medications from other doctors if taken off of his pain medications. Reinforced to pt that doctor stated that she was going to evaluate his pain medications along with his other medications to help with his depression. Pt has been sad depressed and did not go to cafeteria for lunch. Pt contracts that he will not hurt himself in the hospital. Pt stated that if he is discharged today and does not get his pain medication, he will take the bottle of pain medications in his locker. Supported pt to discuss feelings. Redirected pt to present as pt was worrying about doctor taking away his morphine. Reported to MD. Pt talked about the loss of his wife and child. Pt stated that his mother committed suicide when he was 33. Pt is monitored q 15 minute checks. Safety maintained on the unit.

## 2012-05-05 NOTE — Progress Notes (Signed)
[facility] Group Notes:  (Counselor/Nursing/MHT/Case Management/Adjunct)  05/05/2012 8:10PM  Type of Therapy:  Psychoeducational Skills  Participation Level:  Minimal  Participation Quality:  Appropriate, Drowsy and Inattentive  Affect:  Appropriate and Flat  Cognitive:  Appropriate  Insight:  Limited  Engagement in Group:  Limited  Engagement in Therapy:  Limited  Modes of Intervention:  Wrap-Up Group  Summary of Progress/Problems: Pt said that he is happy that he was able to control himself today when one of his peers said something inappropriate to him. Pt said that he is going to keep it moving whenever he has to deal with difficult people. Pt said that he wants to continue to going to all groups. Pt said that he wants to work on setting up his outpatient therapy so that he will be able to continue to get help outside of [facility]. Pt shared that he has supportive people in his life such as his stepfather and other family members and friends  Latonja Bobeck K 05/05/2012, 9:19 PM

## 2012-05-05 NOTE — Progress Notes (Signed)
Reviewed

## 2012-05-05 NOTE — Progress Notes (Signed)
Psychoeducational Group Note  Date:  05/05/2012 Time:  1000  Group Topic/Focus:  Coping Skills Pictionary  Participation Level:  Active  Participation Quality:  Appropriate and Attentive  Affect:  Appropriate  Cognitive:  Alert and Appropriate  Insight:  Engaged  Engagement in Group:  Engaged  Additional Comments:  Pt was appropriate and attentive while attending group. Pt was redirected a couple of times for making inappropriate comments toward another pt.  Sharyn Lull 05/05/2012, 10:58 AM

## 2012-05-05 NOTE — Progress Notes (Signed)
D: Patient denies HI and A/V hallucinations and admits to on and off thoughts of SI and states that its " more serious and that he is ready to get discharged so I can go pop " as he was pointing to his head; patient reports sleep is fair; reports appetite is improving ; reports energy level is normal ; reports ability to pay attention is good ; rates depression as 8/10; rates hopelessness 7/10; rates anxiety as 5/10;   A: Monitored q 15 minutes; patient encouraged to attend groups; patient educated about medications; patient given medications per physician orders; patient encouraged to express feelings and/or concerns; pen medications as needed  R: Patient is depressed and very assertive and needy ; patient's interaction with staff and peers is appropriate; patient was able to set goal to talk with staff 1:1 when having feelings of SI; patient is taking medications as prescribed and tolerating medications; patient is attending all groups

## 2012-05-05 NOTE — Social Work (Signed)
[facility] LCSW Aftercare Discharge Planning Group Note  05/05/2012  8:45 AM  Participation Quality:  Resistant  Affect:  Irritable and Resistant  Cognitive:  Appropriate  Insight:  None  Engagement in Group:  Resistant  Modes of Intervention:  Discussion, Education and Support  Summary of Progress/Problems: Pt attended discharge planning group and  refused to actively participate in group.  CSW provided pt with today's workbook.  patient stated "I'm not going to talk so just don't ask me to ". Patient would only say "I'm suicidal off and on but I can contract for safety". Patient appeared very irritable and was observed having side bar conversations with peers.    [facility] LCSW Group Therapy  05/05/2012 1:15 PM  Type of Therapy:  Group Therapy  Participation Level:  Minimal  Participation Quality:  Attentive, Intrusive and Sharing  Affect:  Appropriate  Cognitive:  Appropriate  Insight:  Improving  Engagement in Therapy:  Limited  Modes of Intervention:  Discussion, Education, Socialization and Support  Summary of Progress/Problems: Patient participated minimally in group discussion focused on feelings about diagnosis. Patient engaged in side bar conversations with peers requiring redirection. Patient stated that his diagnosis has become more label that he is not always comfortable with. Patient believes people should treat others with mental illness the same way as they treat others with physical illnesses but says this has not been the case with him.   Patton Salles LCSW 05/05/2012 6:57 AM

## 2012-05-05 NOTE — Progress Notes (Signed)
[facility] MD Progress Note  05/05/2012 10:36 AM [name] [name]  MRN:  [id number] Subjective:  7/10 depression with passive suicidal ideations Diagnosis:   Axis I: Depressive Disorder NOS, Substance Abuse and Substance Induced Mood Disorder Axis II: Deferred Axis III:  Past Medical History  Diagnosis Date  . Seizure   . Protein C deficiency   . Protein C deficiency    Axis IV: economic problems, housing problems, occupational problems, other psychosocial or environmental problems, problems related to social environment and problems with primary support group Axis V: 41-50 serious symptoms  ADL's:  Intact  Sleep: Fair  Appetite:  Poor  Suicidal Ideation:  Plan:  No Intent:  None Means:  None Homicidal Ideation:  Denies  Psychiatric Specialty Exam: Review of Systems  Constitutional: Negative.   HENT: Negative.   Eyes: Negative.   Respiratory: Negative.   Cardiovascular: Negative.   Gastrointestinal: Negative.   Genitourinary: Negative.   Musculoskeletal:       Left leg pain  Skin: Negative.   Neurological: Negative.   Endo/Heme/Allergies: Negative.   Psychiatric/Behavioral: Positive for depression and suicidal ideas.    Blood pressure 120/78, pulse 74, temperature 97.2 F (36.2 C), temperature source Oral, resp. rate 18, height 5\' 8"  (1.727 m), weight 72.576 kg (160 lb).Body mass index is 24.33 kg/(m^2).  General Appearance: Casual  Eye Contact::  Fair  Speech:  Normal Rate  Volume:  Normal  Mood:  Depressed  Affect:  Flat  Thought Process:  Logical  Orientation:  Full (Time, Place, and Person)  Thought Content:  WDL  Suicidal Thoughts:  No  Homicidal Thoughts:  No  Memory:  Immediate;   Fair Recent;   Fair Remote;   Fair  Judgement:  Impaired  Insight:  Lacking  Psychomotor Activity:  Decreased  Concentration:  Fair  Recall:  Fair  Akathisia:  No  Handed:  Right  AIMS (if indicated):     Assets:  Resilience  Sleep:  Number of Hours: 5.75    Current  Medications: Current Facility-Administered Medications  Medication Dose Route Frequency Provider Last Rate Last Dose  . acetaminophen (TYLENOL) tablet 650 mg  650 mg Oral Q6H PRN Rachael Fee, MD      . alum & mag hydroxide-simeth (MAALOX/MYLANTA) 200-200-20 MG/5ML suspension 30 mL  30 mL Oral Q4H PRN Rachael Fee, MD      . docusate sodium (COLACE) capsule 100 mg  100 mg Oral BID Rachael Fee, MD   100 mg at 05/04/12 1652  . folic acid (FOLVITE) tablet 1 mg  1 mg Oral Daily Rachael Fee, MD   1 mg at 05/05/12 2841  . lacosamide (VIMPAT) tablet 300 mg  300 mg Oral BID Rachael Fee, MD   300 mg at 05/05/12 3244  . levETIRAcetam (KEPPRA) tablet 2,000 mg  2,000 mg Oral BID Rachael Fee, MD   2,000 mg at 05/05/12 0102  . LORazepam (ATIVAN) tablet 0.5 mg  0.5 mg Oral BID Himabindu Ravi, MD   0.5 mg at 05/05/12 0823  . magnesium hydroxide (MILK OF MAGNESIA) suspension 30 mL  30 mL Oral Daily PRN Rachael Fee, MD      . morphine (MSIR) tablet 15 mg  15 mg Oral Q8H PRN Himabindu Ravi, MD   15 mg at 05/05/12 0654  . nicotine polacrilex (NICORETTE) gum 2 mg  2 mg Oral Q1H PRN Mickie D. Adams, PA   2 mg at 05/05/12 0929  . PHENobarbital (LUMINAL) tablet 64.8  mg  64.8 mg Oral QHS Rachael Fee, MD   64.8 mg at 05/04/12 2201  . phenytoin (DILANTIN) ER capsule 200 mg  200 mg Oral BID Rachael Fee, MD   200 mg at 05/05/12 1610  . risperiDONE (RISPERDAL) tablet 0.5 mg  0.5 mg Oral BID Himabindu Ravi, MD   0.5 mg at 05/05/12 0824  . Rivaroxaban (XARELTO) tablet 20 mg  20 mg Oral Q supper Rachael Fee, MD   20 mg at 05/04/12 1652  . sertraline (ZOLOFT) tablet 100 mg  100 mg Oral Daily Himabindu Ravi, MD   100 mg at 05/05/12 0824  . thiamine (VITAMIN B-1) tablet 100 mg  100 mg Oral Daily Rachael Fee, MD   100 mg at 05/05/12 9604  . topiramate (TOPAMAX) tablet 100 mg  100 mg Oral BID Rachael Fee, MD   100 mg at 05/05/12 5409    Lab Results: No results found for this or any previous visit (from the  past 48 hour(s)).  Physical Findings: AIMS: Facial and Oral Movements Muscles of Facial Expression: None, normal Lips and Perioral Area: None, normal Jaw: None, normal Tongue: None, normal,Extremity Movements Upper (arms, wrists, hands, fingers): None, normal Lower (legs, knees, ankles, toes): None, normal, Trunk Movements Neck, shoulders, hips: None, normal, Overall Severity Severity of abnormal movements (highest score from questions above): None, normal Incapacitation due to abnormal movements: None, normal Patient's awareness of abnormal movements (rate only patient's report): No Awareness, Dental Status Current problems with teeth and/or dentures?: No Does patient usually wear dentures?: No  CIWA:  CIWA-Ar Total: 4  COWS:  COWS Total Score: 2   Treatment Plan Summary: Daily contact with patient to assess and evaluate symptoms and progress in treatment Medication management  Plan:  Review of chart, vital signs, medications, and notes. 1-Individual and group therapy 2-Medication management for depression, pain, and anxiety:  Medications reviewed with the patient and he said he had some pain but "not unbearable", located in left lower leg--no signs or symptoms of pain, no limp noted.  [facility] in [place] notified yesterday to verify pain regiment, patient has not been a patient there even though her told us he got his morphine--morphine will be tapered off this week per psychiatrist orders.  [name] [name] rates his depression a 7/10 and reports "on and off suicidal thoughts but no intentions", plans, and contracts for safety. 3-Coping skills for depression, anxiety, and pain 4-Continue crisis stabilization and management 5-Monitoring for seizures, none noted 6-Supportive environment in place to assist patient through morphine withdrawal and grieving/depression issues 7-Treatment plan in progress to prevent relapse of depression and anxiety  Medical Decision Making Problem  Points:  Established problem, worsening (2) and Review of psycho-social stressors (1) Data Points:  Review of medication regiment & side effects (2)  I certify that inpatient services furnished can reasonably be expected to improve the patient's condition.   [name], PMH-NP 05/05/2012, 10:36 AM

## 2012-05-05 NOTE — Progress Notes (Signed)
Patient ID: [name] [name], male   DOB: [birth year], 31 y.o.   MRN: [id number] D: pt. Visible in dayroom watching TV, does interact with other clients. Pt. Endorsed passive thoughts of hurting self, no plans. A: Writer encouraged pt. To move forward in life. Writer also encouraged grief support. Staff will monitor q36min for safety.  Staff encouraged group. R: pt. Is safe on the unit. Pt. Noted that he does go to group. Pt. Confirms that he does go to grief counseling.

## 2012-05-05 NOTE — Progress Notes (Signed)
Psychoeducational Group Note  Date:  05/05/2012 Time: 1100 hrs  Group Topic/Focus: Therapeutic Activity: Question Ball   Participation Level:  Active  Participation Quality:  Appropriate  Affect:  Appropriate  Cognitive:  Appropriate  Insight:  Engaged  Engagement in Group:  Engaged  Additional Comments:    Barton Fanny 05/05/2012, 3:41 PM

## 2012-05-06 MED ORDER — MORPHINE SULFATE 15 MG PO TABS
15.0000 mg | ORAL_TABLET | Freq: Two times a day (BID) | ORAL | Status: DC | PRN
  Administered 2012-05-06 – 2012-05-08 (×4): 15 mg via ORAL
  Filled 2012-05-06 (×5): qty 1

## 2012-05-06 NOTE — Progress Notes (Signed)
[facility] MD Progress Note  05/06/2012 9:56 AM [name] [name]  MRN:  [id number] Subjective:  7/10 depression with passive suicidal ideations Diagnosis:   Axis I: Bereavement, Depressive Disorder NOS, Substance Abuse and Substance Induced Mood Disorder Axis II: Deferred Axis III:  Past Medical History  Diagnosis Date  . Seizure   . Protein C deficiency   . Protein C deficiency    Axis IV: economic problems, educational problems, occupational problems, other psychosocial or environmental problems, problems related to social environment and problems with primary support group Axis V: 41-50 serious symptoms  ADL's:  Intact  Sleep: Good  Appetite:  Fair  Suicidal Ideation:  Plan:  Blow his brains out Intent:  No [name]:  None Homicidal Ideation:  Denies  Psychiatric Specialty Exam: Review of Systems  Constitutional: Negative.   HENT: Negative.   Eyes: Negative.   Respiratory: Negative.   Cardiovascular: Negative.   Gastrointestinal: Negative.   Genitourinary: Negative.   Musculoskeletal: Negative.   Skin: Negative.   Neurological: Negative.   Endo/Heme/Allergies: Negative.   Psychiatric/Behavioral: Positive for depression and substance abuse.    Blood pressure 120/78, pulse 74, temperature 97.2 F (36.2 C), temperature source Oral, resp. rate 18, height 5\' 8"  (1.727 m), weight 72.576 kg (160 lb).Body mass index is 24.33 kg/(m^2).  General Appearance: Casual  Eye Contact::  Fair  Speech:  Slow  Volume:  Normal  Mood:  Depressed  Affect:  Congruent  Thought Process:  Coherent  Orientation:  Full (Time, Place, and Person)  Thought Content:  WDL  Suicidal Thoughts:  Yes.  without intent/plan  Homicidal Thoughts:  No  Memory:  Immediate;   Fair Recent;   Fair Remote;   Fair  Judgement:  Fair  Insight:  Lacking  Psychomotor Activity:  Decreased  Concentration:  Fair  Recall:  Fair  Akathisia:  No  Handed:  Right  AIMS (if indicated):     Assets:  Desire for  Improvement Social Support  Sleep:  Number of Hours: 5.75    Current Medications: Current Facility-Administered Medications  Medication Dose Route Frequency Provider Last Rate Last Dose  . acetaminophen (TYLENOL) tablet 650 mg  650 mg Oral Q6H PRN Rachael Fee, MD      . alum & mag hydroxide-simeth (MAALOX/MYLANTA) 200-200-20 MG/5ML suspension 30 mL  30 mL Oral Q4H PRN Rachael Fee, MD      . docusate sodium (COLACE) capsule 100 mg  100 mg Oral BID Rachael Fee, MD   100 mg at 05/04/12 1652  . folic acid (FOLVITE) tablet 1 mg  1 mg Oral Daily Rachael Fee, MD   1 mg at 05/06/12 0755  . lacosamide (VIMPAT) tablet 300 mg  300 mg Oral BID Rachael Fee, MD   300 mg at 05/06/12 0754  . levETIRAcetam (KEPPRA) tablet 2,000 mg  2,000 mg Oral BID Rachael Fee, MD   2,000 mg at 05/06/12 0755  . LORazepam (ATIVAN) tablet 0.5 mg  0.5 mg Oral BID Himabindu Ravi, MD   0.5 mg at 05/06/12 0754  . magnesium hydroxide (MILK OF MAGNESIA) suspension 30 mL  30 mL Oral Daily PRN Rachael Fee, MD      . morphine (MSIR) tablet 15 mg  15 mg Oral Q8H PRN Himabindu Ravi, MD   15 mg at 05/06/12 0657  . nicotine polacrilex (NICORETTE) gum 2 mg  2 mg Oral Q1H PRN Mickie D. Adams, PA   2 mg at 05/06/12 0910  . PHENobarbital (  LUMINAL) tablet 64.8 mg  64.8 mg Oral QHS Rachael Fee, MD   64.8 mg at 05/05/12 2205  . phenytoin (DILANTIN) ER capsule 200 mg  200 mg Oral BID Rachael Fee, MD   200 mg at 05/06/12 0754  . risperiDONE (RISPERDAL) tablet 0.5 mg  0.5 mg Oral BID Himabindu Ravi, MD   0.5 mg at 05/06/12 0755  . Rivaroxaban (XARELTO) tablet 20 mg  20 mg Oral Q supper Rachael Fee, MD   20 mg at 05/05/12 1638  . sertraline (ZOLOFT) tablet 100 mg  100 mg Oral Daily Himabindu Ravi, MD   100 mg at 05/06/12 0755  . thiamine (VITAMIN B-1) tablet 100 mg  100 mg Oral Daily Rachael Fee, MD   100 mg at 05/06/12 0754  . topiramate (TOPAMAX) tablet 100 mg  100 mg Oral BID Rachael Fee, MD   100 mg at 05/06/12 4098     Lab Results: No results found for this or any previous visit (from the past 48 hour(s)).  Physical Findings: AIMS: Facial and Oral Movements Muscles of Facial Expression: None, normal Lips and Perioral Area: None, normal Jaw: None, normal Tongue: None, normal,Extremity Movements Upper (arms, wrists, hands, fingers): None, normal Lower (legs, knees, ankles, toes): None, normal, Trunk Movements Neck, shoulders, hips: None, normal, Overall Severity Severity of abnormal movements (highest score from questions above): None, normal Incapacitation due to abnormal movements: None, normal Patient's awareness of abnormal movements (rate only patient's report): No Awareness, Dental Status Current problems with teeth and/or dentures?: No Does patient usually wear dentures?: No  CIWA:  CIWA-Ar Total: 4  COWS:  COWS Total Score: 2   Treatment Plan Summary: Daily contact with patient to assess and evaluate symptoms and progress in treatment Medication management  Plan:  Review of chart, vital signs, medications, and notes. 1-Individual and group therapy 2-Medication management for depression, pain, bereavement, and anxiety:  Medications reviewed with the patient and morphine reduced to BID per psychiatrist's verbal order, client aware 3-Coping skills for depression, anxiety, and pain:  [name] [name] maintained his composure when another patient said a very hurtful remark to him in group (disruptive patient moved to another area), positive reinforcement given to [employer] on his behavior 4-Continue crisis stabilization and management 5-Address health issues--monitoring seizure regiment and activity (none noted) 6-Treatment plan in progress to prevent relapse of depression and anxiety 7-Supportive environment in place to optimize rehab   Medical Decision Making Problem Points:  Established problem, stable/improving (1) and Review of psycho-social stressors (1) Data Points:  Review of medication regiment &  side effects (2) Review of new medications or change in dosage (2)  I certify that inpatient services furnished can reasonably be expected to improve the patient's condition.   [name] [name], PMH-NP 05/06/2012, 9:56 AM

## 2012-05-06 NOTE — Progress Notes (Signed)
  D) Patient animated and cooperative upon my assessment. Patient completed Patient Self Inventory, reports slept "fair," and  appetite is "improving." Patient rates depression as  8 /10, patient rates hopeless feelings as  7/10. Patient endorses "off and on" SI, contracts verbally for safety with RN. Patient denies HI, denies A/V hallucinations.   A) Patient offered support and encouragement, patient encouraged to discuss feelings/concerns with staff. Patient verbalized understanding. Patient monitored Q15 minutes for safety. Patient met with MD  to discuss today's goals and plan of care.  R) Patient visible in milieu, attending groups in day room and meals in dining room. Patient appropriate with staff and most peers.   Patient taking medications as ordered. Will continue to monitor.

## 2012-05-06 NOTE — Progress Notes (Signed)
Psychoeducational Group Note  Date:  05/06/2012 Time:  8:00PM  Group Topic/Focus:  Wrap-Up Group:   The focus of this group is to help patients review their daily goal of treatment and discuss progress on daily workbooks.  Participation Level:  Active  Participation Quality:  Appropriate  Affect:  Appropriate  Cognitive:  Alert and Oriented  Insight:  Developing/Improving  Engagement in Group:  Developing/Improving  Additional Comments:  Pt rated his day as a 6. Pt stated that one positive for today was that he was able to joke around. Pt stated that his new years resolution is to attend grief and loss counseling/groups. Pt stated that he wants to work on attending all of his groups.  Connie Lasater, Randal Buba 05/06/2012, 9:06 PM

## 2012-05-06 NOTE — Progress Notes (Signed)
D: Patient in the dayroom watching a movie on approach.  Patient appears to be engaged.  Patient states his day started off bad because another patient got him up but patient states he feels better now.  Patient states he takes nicotine gum at home several times a day because it helps.  Patient states he would rather chew the gum.  Patient appears flat and sad.  Patient is passive SI but verbally contracts for safety.  Patient denies HI and AVH. A: Staff to monitor Q 15 mins for safety.  Encouragement and support offered.  Scheduled medications administered per orders.  Nicotine gum administered several times prn. R: Patient remains safe on the unit.  Patient attended group tonight.  Patient taking administered medications.  Patient calm and cooperative tonight

## 2012-05-06 NOTE — Progress Notes (Signed)
Pt attended group today and participated in New Year activities.  

## 2012-05-07 NOTE — Progress Notes (Signed)
Patient did attend the evening karaoke group.  

## 2012-05-07 NOTE — Progress Notes (Signed)
D:  Patient's self inventory sheet, patient sleeps well, has improving appetite, normal energy level, good attention span.  Rated depression and hopelessness #8, anxiety #3.  Denied withdrawals.  SI off/on, contracts for safety, no plan to hurt self while at [facility].  Has felt pain in past 24 hours.  Pain goal #2, worst pain #9.  Wants to attend grief counseling after discharge.  Pain is getting worst since med adjustment.  Left leg #7.5.  Not cool.  Plans to return to his apartment after discharge.  No problems taking meds after discharge. A:  Medications administered per MD order.  Support and encouragement given throughout day.  Support and safety checks completed as ordered. R:  Following treatment plan.  SI, no plan, contracts for safety.  Denied  HI.  Denied A/V hallucinations.  Patient remains safe and receptive on unit. Patient came out of group stated CM said something to make him made, would not discuss conversation with nurse.

## 2012-05-07 NOTE — Social Work (Signed)
[facility] LCSW Aftercare Discharge Planning Group Note  05/07/2012  8:45 AM  Participation Quality:  Inattentive  Affect:  Appropriate  Cognitive:  Appropriate  Insight:  Lacking  Engagement in Group:  Lacking  Modes of Intervention:  Discussion, Education, Socialization and Support  Summary of Progress/Problems: Pt attended discharge planning group and minimally participated in group.  CSW provided pt with today's workbook.  Patient reports he is suicidal on and off but says he can contract for safety. Patient ranks himself as 7 for depression and hopelessness and helplessness and a 2 for anxiety. When patient was told he might be leaving today he stated he was not ready to talk about leaving until talk with the doctor.       Patton Salles social work 05/07/2012 6:54 AM

## 2012-05-07 NOTE — Progress Notes (Signed)
Psychoeducational Group Note  Date:  05/07/2012 Time: 1000  Group Topic/Focus:  Therapeuic Activity  Participation Level:  Active  Participation Quality:  Appropriate, Attentive and Sharing  Affect:  Appropriate  Cognitive:  Appropriate  Insight:  Engaged  Engagement in Group:  Engaged  Additional Comments:  Patient attended and participated in therapeutic activity.  Karleen Hampshire Brittini 05/07/2012, 12:58 PM

## 2012-05-07 NOTE — Clinical Social Work Note (Signed)
[facility] LCSW Group Therapy       Living a Balanced Life  1:15-2:30                  05/07/2012 3:30 PM    Type of Therapy:  Group Therapy  Participation Level: Did not attend group   [name] [name] 05/07/2012 3:30 PM

## 2012-05-07 NOTE — Progress Notes (Signed)
Reviewed

## 2012-05-07 NOTE — Progress Notes (Signed)
[facility] MD Progress Note  05/07/2012 1:19 PM [name] [name]  MRN:  [id number] Subjective:  8/10 depression, suicidal ideations Diagnosis:   Axis I: Anxiety Disorder NOS, Depressive Disorder NOS, Substance Abuse and Substance Induced Mood Disorder Axis II: Deferred Axis III:  Past Medical History  Diagnosis Date  . Seizure   . Protein C deficiency   . Protein C deficiency    Axis IV: economic problems, occupational problems, other psychosocial or environmental problems, problems related to social environment and problems with primary support group Axis V: 41-50 serious symptoms  ADL's:  Intact  Sleep: Fair  Appetite:  Fair  Suicidal Ideation:  Plan:  "Blow his brains out" Intent:  none Means:  none Homicidal Ideation:  Denies  Psychiatric Specialty Exam: Review of Systems  Constitutional: Negative.   HENT: Negative.   Eyes: Negative.   Respiratory: Negative.   Cardiovascular: Negative.   Gastrointestinal: Negative.   Genitourinary: Negative.   Musculoskeletal: Negative.   Skin: Negative.   Neurological: Negative.   Endo/Heme/Allergies: Negative.   Psychiatric/Behavioral: Positive for depression, suicidal ideas and substance abuse. The patient is nervous/anxious.     Blood pressure 128/84, pulse 81, temperature 97.3 F (36.3 C), temperature source Oral, resp. rate 16, height 5\' 8"  (1.727 m), weight 72.576 kg (160 lb).Body mass index is 24.33 kg/(m^2).  General Appearance: Casual  Eye Contact::  Fair  Speech:  Normal Rate  Volume:  Normal  Mood:  Depressed  Affect:  Congruent  Thought Process:  Coherent  Orientation:  Full (Time, Place, and Person)  Thought Content:  WDL  Suicidal Thoughts:  Yes.  without intent/plan  Homicidal Thoughts:  No  Memory:  Immediate;   Fair Recent;   Fair Remote;   Fair  Judgement:  Fair  Insight:  Lacking  Psychomotor Activity:  Decreased  Concentration:  Fair  Recall:  Fair  Akathisia:  No  Handed:  Right  AIMS (if  indicated):     Assets:  Communication Skills Resilience  Sleep:  Number of Hours: 6.5    Current Medications: Current Facility-Administered Medications  Medication Dose Route Frequency Provider Last Rate Last Dose  . acetaminophen (TYLENOL) tablet 650 mg  650 mg Oral Q6H PRN Rachael Fee, MD      . alum & mag hydroxide-simeth (MAALOX/MYLANTA) 200-200-20 MG/5ML suspension 30 mL  30 mL Oral Q4H PRN Rachael Fee, MD      . docusate sodium (COLACE) capsule 100 mg  100 mg Oral BID Rachael Fee, MD   100 mg at 05/04/12 1652  . folic acid (FOLVITE) tablet 1 mg  1 mg Oral Daily Rachael Fee, MD   1 mg at 05/07/12 0748  . lacosamide (VIMPAT) tablet 300 mg  300 mg Oral BID Rachael Fee, MD   300 mg at 05/07/12 0748  . levETIRAcetam (KEPPRA) tablet 2,000 mg  2,000 mg Oral BID Rachael Fee, MD   2,000 mg at 05/07/12 0748  . LORazepam (ATIVAN) tablet 0.5 mg  0.5 mg Oral BID Himabindu Ravi, MD   0.5 mg at 05/07/12 0750  . magnesium hydroxide (MILK OF MAGNESIA) suspension 30 mL  30 mL Oral Daily PRN Rachael Fee, MD      . morphine (MSIR) tablet 15 mg  15 mg Oral Q12H PRN [name], NP   15 mg at 05/07/12 0658  . nicotine polacrilex (NICORETTE) gum 2 mg  2 mg Oral Q1H PRN Mickie D. Adams, PA   2 mg at 05/07/12  1249  . PHENobarbital (LUMINAL) tablet 64.8 mg  64.8 mg Oral QHS Rachael Fee, MD   64.8 mg at 05/06/12 2105  . phenytoin (DILANTIN) ER capsule 200 mg  200 mg Oral BID Rachael Fee, MD   200 mg at 05/07/12 0750  . risperiDONE (RISPERDAL) tablet 0.5 mg  0.5 mg Oral BID Himabindu Ravi, MD   0.5 mg at 05/07/12 0751  . Rivaroxaban (XARELTO) tablet 20 mg  20 mg Oral Q supper Rachael Fee, MD   20 mg at 05/06/12 1655  . sertraline (ZOLOFT) tablet 100 mg  100 mg Oral Daily Himabindu Ravi, MD   100 mg at 05/07/12 0751  . thiamine (VITAMIN B-1) tablet 100 mg  100 mg Oral Daily Rachael Fee, MD   100 mg at 05/07/12 0752  . topiramate (TOPAMAX) tablet 100 mg  100 mg Oral BID Rachael Fee, MD    100 mg at 05/07/12 1610    Lab Results: No results found for this or any previous visit (from the past 48 hour(s)).  Physical Findings: AIMS: Facial and Oral Movements Muscles of Facial Expression: None, normal Lips and Perioral Area: None, normal Jaw: None, normal Tongue: None, normal,Extremity Movements Upper (arms, wrists, hands, fingers): None, normal Lower (legs, knees, ankles, toes): None, normal, Trunk Movements Neck, shoulders, hips: None, normal, Overall Severity Severity of abnormal movements (highest score from questions above): None, normal Incapacitation due to abnormal movements: None, normal Patient's awareness of abnormal movements (rate only patient's report): No Awareness, Dental Status Current problems with teeth and/or dentures?: No Does patient usually wear dentures?: No  CIWA:  CIWA-Ar Total: 4  COWS:  COWS Total Score: 2   Treatment Plan Summary: Daily contact with patient to assess and evaluate symptoms and progress in treatment Medication management  Plan:  Review of chart, vital signs, medications, and notes. 1-Individual and group therapy 2-Medication management for depression:  Medications reviewed with the patient and decrease in morphine level does not appear to be effecting his pain issues, no behavioral or facial expressions of pain noted 3-Coping skills for depression and pain 4-Continue crisis stabilization and management 5-Address health issues--monitoring pain levels 6-Treatment plan in progress to prevent relapse of depression and anxiety   Medical Decision Making Problem Points:  Review of last therapy session (1) and Review of psycho-social stressors (1) Data Points:  Review of medication regiment & side effects (2)  I certify that inpatient services furnished can reasonably be expected to improve the patient's condition.   [name], PMH-NP 05/07/2012, 1:19 PM

## 2012-05-07 NOTE — Progress Notes (Signed)
D    Pt appears sad and depressed  He reports still feeling like he wants to hurt himself but contracts for safety on the unit  He said he thinks if he was discharged he would likely hurt himself   He is cooperative and pleasant  He asks for the nicorette gum frequently   A   Verbal support given   Medications administered and effectiveness monitored   Q 15 min checks R   Pt safe at present

## 2012-05-08 MED ORDER — MORPHINE SULFATE 15 MG PO TABS
15.0000 mg | ORAL_TABLET | ORAL | Status: DC
  Administered 2012-05-09: 15 mg via ORAL
  Filled 2012-05-08: qty 1

## 2012-05-08 NOTE — Progress Notes (Addendum)
D) Pt was brought into treatment team and asked why he would not give [place] information about him. It was discovered that the Pt. Does not attend a pain management group and is on probation for a crime that he committed 3 years ago. Pt became angry and began yelling loudly when this was brought up.  A) The folks that were in the treatment team were very supportive of the Pt and many attempts were made at empowering the Pt. Through choices, asking questions and posing suggestions. It was explained to Pt that we are trying to put the pieces together so that we can come up with a good discharge plan for him. R) Pt became agitated and walked out of the meeting States that he feels as though people are lying about him. Admits to SI currently and was told at this point he would not be discharged.

## 2012-05-08 NOTE — Progress Notes (Signed)
[facility] MD Progress Note  05/08/2012 11:13 AM [name] [name]  MRN:  [id number] Subjective:  Patient continues to endorse suicidal thoughts and depressed mood. Tolerating taper in Morphine well. A tablet (phenobarbital?) was found in his room, patient was found to be snorting a medication last week.  Diagnosis:   Axis I: Depressive Disorder NOS, substance Abuse Axis II: Deferred Axis III:  Past Medical History  Diagnosis Date  . Seizure   . Protein C deficiency   . Protein C deficiency    Axis IV: economic problems, housing problems and occupational problems Axis V: 51-60 moderate symptoms  ADL's:  Intact  Sleep: Fair  Appetite:  Fair   Psychiatric Specialty Exam: Review of Systems  Constitutional: Negative.   HENT: Negative.   Eyes: Negative.   Respiratory: Negative.   Cardiovascular: Negative.   Gastrointestinal: Negative.   Genitourinary: Negative.   Musculoskeletal:       Leg pain  Skin: Negative.   Neurological: Negative.   Endo/Heme/Allergies: Negative.   Psychiatric/Behavioral: Positive for depression and suicidal ideas. The patient is nervous/anxious.     Blood pressure 142/79, pulse 67, temperature 96.4 F (35.8 C), temperature source Oral, resp. rate 16, height 5\' 8"  (1.727 m), weight 72.576 kg (160 lb).Body mass index is 24.33 kg/(m^2).  General Appearance: Disheveled  Eye Solicitor::  Fair  Speech:  Slow and Slurred  Volume:  Decreased  Mood:  Depressed  Affect:  Constricted  Thought Process:  Coherent  Orientation:  Full (Time, Place, and Person)  Thought Content:  WDL  Suicidal Thoughts:  Yes.  with intent/plan  Homicidal Thoughts:  No  Memory:  Immediate;   Fair Recent;   Fair Remote;   Fair  Judgement:  Impaired  Insight:  Present  Psychomotor Activity:  Normal  Concentration:  Fair  Recall:  Fair  Akathisia:  No  Handed:  Right  AIMS (if indicated):     Assets:  Communication Skills  Sleep:  Number of Hours: 5.25    Current  Medications: Current Facility-Administered Medications  Medication Dose Route Frequency Provider Last Rate Last Dose  . acetaminophen (TYLENOL) tablet 650 mg  650 mg Oral Q6H PRN Rachael Fee, MD      . alum & mag hydroxide-simeth (MAALOX/MYLANTA) 200-200-20 MG/5ML suspension 30 mL  30 mL Oral Q4H PRN Rachael Fee, MD      . docusate sodium (COLACE) capsule 100 mg  100 mg Oral BID Rachael Fee, MD   100 mg at 05/04/12 1652  . folic acid (FOLVITE) tablet 1 mg  1 mg Oral Daily Rachael Fee, MD   1 mg at 05/08/12 0809  . lacosamide (VIMPAT) tablet 300 mg  300 mg Oral BID Rachael Fee, MD   300 mg at 05/08/12 0809  . levETIRAcetam (KEPPRA) tablet 2,000 mg  2,000 mg Oral BID Rachael Fee, MD   2,000 mg at 05/08/12 7829  . LORazepam (ATIVAN) tablet 0.5 mg  0.5 mg Oral BID Elfa Wooton, MD   0.5 mg at 05/08/12 0809  . magnesium hydroxide (MILK OF MAGNESIA) suspension 30 mL  30 mL Oral Daily PRN Rachael Fee, MD      . morphine (MSIR) tablet 15 mg  15 mg Oral 1 day or 1 dose Lisset Ketchem, MD      . nicotine polacrilex (NICORETTE) gum 2 mg  2 mg Oral Q1H PRN Mickie D. Adams, PA   2 mg at 05/08/12 1010  . PHENobarbital (LUMINAL) tablet 64.8  mg  64.8 mg Oral QHS Rachael Fee, MD   64.8 mg at 05/07/12 2155  . phenytoin (DILANTIN) ER capsule 200 mg  200 mg Oral BID Rachael Fee, MD   200 mg at 05/08/12 1610  . risperiDONE (RISPERDAL) tablet 0.5 mg  0.5 mg Oral BID Malcolm Hetz, MD   0.5 mg at 05/08/12 0809  . Rivaroxaban (XARELTO) tablet 20 mg  20 mg Oral Q supper Rachael Fee, MD   20 mg at 05/07/12 1657  . sertraline (ZOLOFT) tablet 100 mg  100 mg Oral Daily Thaniel Coluccio, MD   100 mg at 05/08/12 0808  . thiamine (VITAMIN B-1) tablet 100 mg  100 mg Oral Daily Rachael Fee, MD   100 mg at 05/08/12 0809  . topiramate (TOPAMAX) tablet 100 mg  100 mg Oral BID Rachael Fee, MD   100 mg at 05/08/12 9604    Lab Results: No results found for this or any previous visit (from the past 48  hour(s)).  Physical Findings: AIMS: Facial and Oral Movements Muscles of Facial Expression: None, normal Lips and Perioral Area: None, normal Jaw: None, normal Tongue: None, normal,Extremity Movements Upper (arms, wrists, hands, fingers): None, normal Lower (legs, knees, ankles, toes): None, normal, Trunk Movements Neck, shoulders, hips: None, normal, Overall Severity Severity of abnormal movements (highest score from questions above): None, normal Incapacitation due to abnormal movements: None, normal Patient's awareness of abnormal movements (rate only patient's report): No Awareness, Dental Status Current problems with teeth and/or dentures?: No Does patient usually wear dentures?: No  CIWA:  CIWA-Ar Total: 0  COWS:  COWS Total Score: 0   Treatment Plan Summary: Daily contact with patient to assess and evaluate symptoms and progress in treatment Medication management  Plan: Reduce Morphine dosage to once daily. Discontinue Morphine completely on Sunday. Encouraged patient to contact family and plan for discharge since he has been evasive and not letting case managers contact any family members.  Medical Decision Making Problem Points:  Established problem, worsening (2), Review of last therapy session (1) and Review of psycho-social stressors (1) Data Points:  Review of medication regiment & side effects (2) Review of new medications or change in dosage (2)  I certify that inpatient services furnished can reasonably be expected to improve the patient's condition.   [name] [name] 05/08/2012, 11:13 AM

## 2012-05-08 NOTE — Social Work (Signed)
Interdisciplinary Treatment Plan Update (Adult)  Date:  05/08/2012  Time Reviewed:  7:04 AM    Progress in Treatment: Attending groups:   Yes   Participating in groups:  Yes but at times disruptive Taking medication as prescribed:  Yes Tolerating medication:  Yes Family/Significant othe contact made: Patient has refused to allow others to contact his family Patient understands diagnosis:  Yes Discussing patient identified problems/goals with staff: Yes Medical problems stabilized or resolved: No Denies suicidal/homicidal ideation: No -  Patient able to contract for safety Issues/concerns per patient self-inventory:  Other:  New problem(s) identified:  Reason for Continuation of Hospitalization: Anxiety Depression Medication stabilization Suicidal ideation  Interventions implemented related to continuation of hospitalization:  Medication mgement; safety checks q 15 mins; coping skills development  Additional comments: Patient reports he continues to grieve the loss of his daughter and his daughter's mother and reports the staff sees him "goofing around its because doing that keeps me from being depressed". Patient reports that he is not eating breakfast or lunch and was educated on the importance of doing so. When asked how patient planned to improve, patient reported "to keep going to groups and take everything in" patient rated himself as 7 for depression and anxiety and helplessness and hopelessness and reports having suicidal ideations.  Estimated length of stay: 3-5 days  Discharge Plan:  Outpatient follow up  New goal(s):  Review of initial/current patient goals per problem list:    1.  Goal(s): Eliminate SI/other thoughts of self harm   Met:  No  Target date: d/c  As evidenced by: Patient will no longer endorse SI/HI or other thoughts of self harm.    2.  Goal (s):Reduce depression/anxiety  Met: Yes  Target date: d/c  As evidenced by: Patient will rate  symptoms at four or below    3.  Goal(s):.stabilize on meds   Met:  No  Target date: d/c  As evidenced by: Patient will report being stabilized on medications - less symptomatic    4.  Goal(s): Refer for outpatient follow up   Met:  No  Target date: d/c  As evidenced by: Follow up appointment will be scheduled    Attendees: Patient:   @TD  7:04 AM  Physican:  Patrick North, MD @TD  7:04 AM  Nursing:  Berneice Heinrich, RN  05/08/2012 7:04 AM  Nursing:    @TD  7:04 AM  Clinical Social Worker:  Patton Salles, LCSW @TD  7:04 AM  Other: Wynonia Hazard 05/08/2012 7:04 AM   Other:         05/08/2012 7:04 AM Other:

## 2012-05-08 NOTE — Progress Notes (Signed)
Psychoeducational Group Note  Date:  05/08/2012 Time:  1100  Group Topic/Focus:  Early Warning Signs:   The focus of this group is to help patients identify signs or symptoms they exhibit before slipping into an unhealthy state or crisis.  Participation Level:  Active  Participation Quality:  Appropriate, Attentive and Sharing  Affect:  Appropriate  Cognitive:  Appropriate  Insight:  Engaged  Engagement in Group:  Engaged  Additional Comments:   Patient attended group and share during group. Patient was asked to share a time when he was in a crisis and what were the warning signs of the situation. The areas of the changes that are made during a crisis can consist of thought, behavior, attitude and feelings. Patient shared which areas nd explain some of the changes that were made during the time of a crisis. Patient was asked to complete Early Warning signs worksheet in workbook during the group.  Karleen Hampshire Brittini 05/08/2012, 6:45 PM

## 2012-05-08 NOTE — Progress Notes (Signed)
Reviewed

## 2012-05-08 NOTE — Clinical Social Work Note (Signed)
[facility] LCSW Group Therapy            05/08/2012 3:51 PM    Type of Therapy:  Group Therapy  Participation Level:  Appropriate  Participation Quality:  Appropriate  Affect:  Appropriate  Cognitive:  Attentive Appropriate  Insight:  Engaged  Engagement in Therapy:  Engaged  Modes of Intervention:  Discussion Exploration Problem-Solving Supportive  Summary of Progress/Problems:  Patient shared he relapses when he isolates.  He stated isolation leads to depression and when he gets depressed, he becomes suicidal.  Wynn Banker 05/08/2012 3:51 PM

## 2012-05-08 NOTE — Progress Notes (Signed)
[facility] LCSW Group Therapy  05/08/2012 7:03 AM  Type of Therapy:  Discharge Planning  Participation Level:  Minimal  Participation Quality:  Attentive, Intrusive and Sharing  Affect:  Appropriate  Cognitive:  Appropriate  Insight:  Limited  Engagement in Therapy:  Limited  Modes of Intervention:  Discussion, Education, Exploration and Support  Summary of Progress/Problems: Patient continues to report having suicidal ideations and rated himself a 7 across the board for depression, anxiety, helplessness and hopelessness. Patient says he continues to grieve the loss of his daughter and that his daughter's mother and says he will keep going to groups "to try to take everything in. If the staff sees me goofing around its because when I do that it helps to keep me from being depressed". Patient's were also educated on suicide information and prevention during group session.  [name] [name] 05/08/2012, 7:03 AM

## 2012-05-09 DIAGNOSIS — R45851 Suicidal ideations: Secondary | ICD-10-CM

## 2012-05-09 DIAGNOSIS — F329 Major depressive disorder, single episode, unspecified: Secondary | ICD-10-CM

## 2012-05-09 NOTE — Progress Notes (Signed)
Patient has been in the dayroom asleep most of the evening, attended group but slept through it. Patient  has voiced no complaints. Patient currently denies si/hi/a/v hall. Support and encouragement offered, safety maintained on unit, will continue to monitor.

## 2012-05-09 NOTE — Progress Notes (Signed)
Psychoeducational Group Note  Date:  05/09/2012 Time:    Group Topic/Focus:  Identifying Needs:   The focus of this group is to help patients identify their personal needs that have been historically problematic and identify healthy behaviors to address their needs.  Participation Level: Did Not Attend  Participation Quality:  Not Applicable  Affect:  Not Applicable  Cognitive:  Not Applicable  Insight:  Not Applicable  Engagement in Group: Not Applicable  Additional Comments:    [name] [name] 05/09/2012, 7:11 PM

## 2012-05-09 NOTE — Progress Notes (Signed)
[facility] Group Notes:  (Counselor/Nursing/MHT/Case Management/Adjunct)  05/09/2012 12:08 AM  Type of Therapy:  Psychoeducational Skills  Participation Level:  None  Participation Quality:  Resistant  Affect:  Flat  Cognitive:  Lacking  Insight:  None  Engagement in Group:  None  Engagement in Therapy:  None  Modes of Intervention:  Education  Summary of Progress/Problems: The patient slept through the entire group.    Kipling Graser S 05/09/2012, 12:08 AM

## 2012-05-09 NOTE — Clinical Social Work Psychosocial (Signed)
[facility] Group Notes:  (Clinical Social Work)  05/09/2012   3:00-4:00PM  Summary of Progress/Problems:   The main focus of today's process group was for the patient to identify ways in which they have in the past sabotaged their own recovery and reasons they may have done this/what they received from doing it.  We then worked to identify a specific plan to avoid doing this when discharged from the hospital for this admission.  The group was very uncommunicative today, with a lot of people who had been through the same class last week.  CSW asked if there were other things they wanted to discuss, and there was a brief discussion about people who were afraid of leaving the hospital, and those who are excited to leave.  The patient expressed a complete unwillingness to talk today.  Type of Therapy:  Group Therapy - Process  Participation Level:  None  Participation Quality:  present  Affect:  Blunted and Depressed  Cognitive:  Oriented  Insight:  None  Engagement in Therapy:  None  Modes of Intervention:  Clarification, Education, Limit-setting, Problem-solving, Socialization, Support and Processing, Exploration, Discussion   Ambrose Mantle, LCSW 05/09/2012, 4:51 PM

## 2012-05-09 NOTE — Progress Notes (Signed)
Writer observed patient up in the dayroom, he attended group and reports that his goal is to have a positive day on tomorrow. Patient received his scheduled phenobarbital and immediately went to the doorway of another male patients door and was going in his room to talk to him and Clinical research associate redirected him either to the dayroom or hallway to visit with him. Patient reports he still has passive si and verbally contracts for safety, denies hi/a/v hallucinations. Support and encouragement offered, safety maintained on unit, will continue to monitor.

## 2012-05-09 NOTE — Progress Notes (Signed)
[facility] MD Progress Note  05/09/2012 10:22 AM [name] [name]  MRN:  [id number] Subjective:  6/10 depression with some suicidal ideations Diagnosis:   Axis I: Depressive Disorder NOS, Substance Abuse and Substance Induced Mood Disorder Axis II: Deferred Axis III:  Past Medical History  Diagnosis Date  . Seizure   . Protein C deficiency   . Protein C deficiency    Axis IV: economic problems, housing problems, occupational problems, other psychosocial or environmental problems, problems related to legal system/crime, problems related to social environment and problems with primary support group Axis V: 41-50 serious symptoms  ADL's:  Intact  Sleep: Good  Appetite:  Fair  Suicidal Ideation:  Plan:  None Intent:  None Means:  None Homicidal Ideation:  Denies  Psychiatric Specialty Exam: Review of Systems  Constitutional: Negative.   HENT: Negative.   Eyes: Negative.   Respiratory: Negative.   Cardiovascular: Negative.   Gastrointestinal: Negative.   Genitourinary: Negative.   Musculoskeletal: Negative.   Skin: Negative.   Neurological: Negative.   Endo/Heme/Allergies: Negative.   Psychiatric/Behavioral: Positive for depression, suicidal ideas and substance abuse.    Blood pressure 142/79, pulse 67, temperature 96.4 F (35.8 C), temperature source Oral, resp. rate 16, height 5\' 8"  (1.727 m), weight 72.576 kg (160 lb).Body mass index is 24.33 kg/(m^2).  General Appearance: Casual  Eye Contact::  Fair  Speech:  Normal Rate  Volume:  Normal  Mood:  Depressed  Affect:  Flat  Thought Process:  Coherent  Orientation:  Full (Time, Place, and Person)  Thought Content:  WDL  Suicidal Thoughts:  Yes.  without intent/plan  Homicidal Thoughts:  No  Memory:  Immediate;   Fair Recent;   Fair Remote;   Fair  Judgement:  Fair  Insight:  Fair  Psychomotor Activity:  Normal  Concentration:  Fair  Recall:  Fair  Akathisia:  No  Handed:  Right  AIMS (if indicated):       Assets:  Resilience  Sleep:  Number of Hours: 6.25    Current Medications: Current Facility-Administered Medications  Medication Dose Route Frequency Provider Last Rate Last Dose  . acetaminophen (TYLENOL) tablet 650 mg  650 mg Oral Q6H PRN Rachael Fee, MD      . alum & mag hydroxide-simeth (MAALOX/MYLANTA) 200-200-20 MG/5ML suspension 30 mL  30 mL Oral Q4H PRN Rachael Fee, MD      . docusate sodium (COLACE) capsule 100 mg  100 mg Oral BID Rachael Fee, MD   100 mg at 05/09/12 0844  . folic acid (FOLVITE) tablet 1 mg  1 mg Oral Daily Rachael Fee, MD   1 mg at 05/09/12 0844  . lacosamide (VIMPAT) tablet 300 mg  300 mg Oral BID Rachael Fee, MD   300 mg at 05/09/12 0844  . levETIRAcetam (KEPPRA) tablet 2,000 mg  2,000 mg Oral BID Rachael Fee, MD   2,000 mg at 05/09/12 0843  . LORazepam (ATIVAN) tablet 0.5 mg  0.5 mg Oral BID [name], NP   0.5 mg at 05/09/12 0844  . magnesium hydroxide (MILK OF MAGNESIA) suspension 30 mL  30 mL Oral Daily PRN Rachael Fee, MD      . nicotine polacrilex (NICORETTE) gum 2 mg  2 mg Oral Q1H PRN Mickie D. Adams, PA   2 mg at 05/09/12 0927  . PHENobarbital (LUMINAL) tablet 64.8 mg  64.8 mg Oral QHS Rachael Fee, MD   64.8 mg at 05/08/12 2141  .  phenytoin (DILANTIN) ER capsule 200 mg  200 mg Oral BID Rachael Fee, MD   200 mg at 05/09/12 0843  . risperiDONE (RISPERDAL) tablet 0.5 mg  0.5 mg Oral BID Himabindu Ravi, MD   0.5 mg at 05/09/12 0844  . Rivaroxaban (XARELTO) tablet 20 mg  20 mg Oral Q supper Rachael Fee, MD   20 mg at 05/08/12 1657  . sertraline (ZOLOFT) tablet 100 mg  100 mg Oral Daily Himabindu Ravi, MD   100 mg at 05/08/12 0808  . thiamine (VITAMIN B-1) tablet 100 mg  100 mg Oral Daily Rachael Fee, MD   100 mg at 05/09/12 0844  . topiramate (TOPAMAX) tablet 100 mg  100 mg Oral BID Rachael Fee, MD   100 mg at 05/09/12 1610    Lab Results: No results found for this or any previous visit (from the past 48 hour(s)).  Physical  Findings: AIMS: Facial and Oral Movements Muscles of Facial Expression: None, normal Lips and Perioral Area: None, normal Jaw: None, normal Tongue: None, normal,Extremity Movements Upper (arms, wrists, hands, fingers): None, normal Lower (legs, knees, ankles, toes): None, normal, Trunk Movements Neck, shoulders, hips: None, normal, Overall Severity Severity of abnormal movements (highest score from questions above): None, normal Incapacitation due to abnormal movements: None, normal Patient's awareness of abnormal movements (rate only patient's report): No Awareness, Dental Status Current problems with teeth and/or dentures?: No Does patient usually wear dentures?: No  CIWA:  CIWA-Ar Total: 0  COWS:  COWS Total Score: 0   Treatment Plan Summary: Daily contact with patient to assess and evaluate symptoms and progress in treatment Medication management  Plan:  Review of chart, vital signs, medications, and notes. 1-Individual and group therapy 2-Medication management for depression and substance dependency:  Medications reviewed with the patient, morphine discontinued--taper complete 3-Coping skills for depression, anxiety, and pain 4-Continue crisis stabilization and management:  Patient still having suicidal ideations but less today than yesterday 5-Address health issues--monitoring seizures, none noted 6-Treatment plan in progress to prevent relapse of depression and anxiety   Medical Decision Making Problem Points:  Established problem, stable/improving (1) and Review of psycho-social stressors (1) Data Points:  Review of new medications or change in dosage (2)  I certify that inpatient services furnished can reasonably be expected to improve the patient's condition.   [name], PMH-NP 05/09/2012, 10:22 AM

## 2012-05-09 NOTE — Progress Notes (Addendum)
D) Pt has been dealing with the knowledge of his medication being reduced today without too much difficulty. Has been hanging around one of the other male Patients and has become friendly with him. Was found sitting in his room this evening and was asked to leave. Pt stated that he didn't realize that he should not be in the room of a male Pt. Has attended the groups and was inappropriate with off handed sexual comments. Pt rates his depression and hopelessness both at a 9 and states that he has SI. Had this writer look up the obituary for his mother where it said that she died after a short illness. Pt has not gone to any meals today and has just eaten snack.  A) Pt given support when appropriate and gently confronted when appropriate. Given encouragement to go to the groups and to go to meals R) Pt remains gamely and has limited insight into his issues. Continues to not give any information concerning his plans after discharge .

## 2012-05-10 NOTE — Progress Notes (Signed)
Agree 

## 2012-05-10 NOTE — Progress Notes (Signed)
Patient has been up and active on the unit, attended group this evening and has voiced no complaints. Patient currently denies si/hi/a/v hall.Patient reported to Clinical research associate that he is possibly going to be discharged on Monday. Patient reports that he continues to have leg pain and was offered tylenol and heat/ice pack and refused them all reporting that they don't work. Support and encouragement offered, safety maintained on unit, will continue to monitor.

## 2012-05-10 NOTE — Clinical Social Work Psychosocial (Signed)
[facility] Group Notes: (Clinical Social Work)   05/10/2012      Type of Therapy:  Group Therapy   Participation Level:  Did Not Attend    Ambrose Mantle, LCSW 05/10/2012, 4:21 PM

## 2012-05-10 NOTE — Progress Notes (Signed)
[facility] Group Notes:  (Counselor/Nursing/MHT/Case Management/Adjunct)  05/10/2012 1:24 AM  Type of Therapy:  Psychoeducational Skills  Participation Level:  Minimal  Participation Quality:  Attentive  Affect:  Blunted  Cognitive:  Appropriate  Insight:  Limited  Engagement in Group:  Developing/Improving  Engagement in Therapy:  Developing/Improving  Modes of Intervention:  Education  Summary of Progress/Problems: The patient remained awake for the entire group this evening, but would not mention anything regarding his day.  His goal for tomorrow is to "have a positive day".    Melvinia Ashby S 05/10/2012, 1:24 AM

## 2012-05-10 NOTE — Progress Notes (Signed)
D) Pt has attended the groups, but does not participate in the group. Affect is sad, flat and mood depressed. Writes that his depression is an 8 and his depression is a 9. Admits to SI outside of the hospital. States he feels safe in the hospital.  Was upset that he will not be getting his pain medications, but believes that he will be discharged on Monday. A) Given support and reassurance. Encouraged to work on where he will go to get his medications after discharge. Contracts for his safety while on the unit. R) Admits to SI but contracts for his safety.

## 2012-05-10 NOTE — Progress Notes (Signed)
Psychoeducational Group Note  Date:  05/10/2012 Time:  1315  Group Topic/Focus:  Conflict Resolution:   The focus of this group is to discuss the conflict resolution process and how it may be used upon discharge.  Participation Level:  None  Participation Quality:  Appropriate and Resistant  Affect:  Appropriate and Flat  Cognitive:  Alert  Engagement in Group:  None  Dalia Heading 05/10/2012, 4:03 PM

## 2012-05-10 NOTE — Progress Notes (Signed)
The patient was found sitting on the bed in another bedroom. He was asked to leave the room due to hospital protocol. He was then redirected a second time for standing in the doorway of that same bedroom.

## 2012-05-10 NOTE — Progress Notes (Signed)
[facility] MD Progress Note  05/10/2012 3:05 PM [name] [name]  MRN:  [id number] Subjective:  5/10 depression, denies anxiety and suicidal ideations Diagnosis:   Axis I: Substance Abuse and Substance Induced Mood Disorder Axis II: Deferred Axis III:  Past Medical History  Diagnosis Date  . Seizure   . Protein C deficiency   . Protein C deficiency    Axis IV: economic problems, housing problems, occupational problems, other psychosocial or environmental problems, problems related to social environment and problems with primary support group Axis V: 41-50 serious symptoms  ADL's:  Intact  Sleep: Good  Appetite:  Good  Suicidal Ideation:  Denies Homicidal Ideation:  Denies  Psychiatric Specialty Exam: Review of Systems  Constitutional: Negative.   HENT: Negative.   Eyes: Negative.   Respiratory: Negative.   Cardiovascular: Negative.   Gastrointestinal: Negative.   Genitourinary: Negative.   Musculoskeletal: Negative.   Skin: Negative.   Neurological: Negative.   Endo/Heme/Allergies: Negative.   Psychiatric/Behavioral: Positive for depression and substance abuse.    Blood pressure 109/72, pulse 67, temperature 98.5 F (36.9 C), temperature source Oral, resp. rate 18, height 5\' 8"  (1.727 m), weight 72.576 kg (160 lb).Body mass index is 24.33 kg/(m^2).  General Appearance: Casual  Eye Contact::  Fair  Speech:  Normal Rate  Volume:  Normal  Mood:  Depressed  Affect:  Flat  Thought Process:  Coherent  Orientation:  Full (Time, Place, and Person)  Thought Content:  WDL  Suicidal Thoughts:  No  Homicidal Thoughts:  No  Memory:  Immediate;   Fair Recent;   Fair Remote;   Fair  Judgement:  Fair  Insight:  Fair  Psychomotor Activity:  Normal  Concentration:  Fair  Recall:  Fair  Akathisia:  No  Handed:  Right  AIMS (if indicated):     Assets:  Communication Skills Resilience  Sleep:  Number of Hours: 5.75    Current Medications: Current Facility-Administered  Medications  Medication Dose Route Frequency Provider Last Rate Last Dose  . acetaminophen (TYLENOL) tablet 650 mg  650 mg Oral Q6H PRN Rachael Fee, MD      . alum & mag hydroxide-simeth (MAALOX/MYLANTA) 200-200-20 MG/5ML suspension 30 mL  30 mL Oral Q4H PRN Rachael Fee, MD      . docusate sodium (COLACE) capsule 100 mg  100 mg Oral BID Rachael Fee, MD   100 mg at 05/10/12 0841  . folic acid (FOLVITE) tablet 1 mg  1 mg Oral Daily Rachael Fee, MD   1 mg at 05/10/12 0841  . lacosamide (VIMPAT) tablet 300 mg  300 mg Oral BID Rachael Fee, MD   300 mg at 05/10/12 0841  . levETIRAcetam (KEPPRA) tablet 2,000 mg  2,000 mg Oral BID Rachael Fee, MD   2,000 mg at 05/10/12 [phone]  . LORazepam (ATIVAN) tablet 0.5 mg  0.5 mg Oral BID [name], NP   0.5 mg at 05/10/12 0841  . magnesium hydroxide (MILK OF MAGNESIA) suspension 30 mL  30 mL Oral Daily PRN Rachael Fee, MD      . nicotine polacrilex (NICORETTE) gum 2 mg  2 mg Oral Q1H PRN Mickie D. Adams, PA   2 mg at 05/10/12 1413  . PHENobarbital (LUMINAL) tablet 64.8 mg  64.8 mg Oral QHS Rachael Fee, MD   64.8 mg at 05/09/12 2100  . phenytoin (DILANTIN) ER capsule 200 mg  200 mg Oral BID Rachael Fee, MD   200 mg  at 05/10/12 0841  . risperiDONE (RISPERDAL) tablet 0.5 mg  0.5 mg Oral BID Himabindu Ravi, MD   0.5 mg at 05/10/12 0841  . Rivaroxaban (XARELTO) tablet 20 mg  20 mg Oral Q supper Rachael Fee, MD   20 mg at 05/09/12 1722  . sertraline (ZOLOFT) tablet 100 mg  100 mg Oral Daily Himabindu Ravi, MD   100 mg at 05/10/12 0841  . thiamine (VITAMIN B-1) tablet 100 mg  100 mg Oral Daily Rachael Fee, MD   100 mg at 05/10/12 0845  . topiramate (TOPAMAX) tablet 100 mg  100 mg Oral BID Rachael Fee, MD   100 mg at 05/10/12 9604    Lab Results: No results found for this or any previous visit (from the past 48 hour(s)).  Physical Findings: AIMS: Facial and Oral Movements Muscles of Facial Expression: None, normal Lips and Perioral Area:  None, normal Jaw: None, normal Tongue: None, normal,Extremity Movements Upper (arms, wrists, hands, fingers): None, normal Lower (legs, knees, ankles, toes): None, normal, Trunk Movements Neck, shoulders, hips: None, normal, Overall Severity Severity of abnormal movements (highest score from questions above): None, normal Incapacitation due to abnormal movements: None, normal Patient's awareness of abnormal movements (rate only patient's report): No Awareness, Dental Status Current problems with teeth and/or dentures?: No Does patient usually wear dentures?: No  CIWA:  CIWA-Ar Total: 0  COWS:  COWS Total Score: 0   Treatment Plan Summary: Daily contact with patient to assess and evaluate symptoms and progress in treatment Medication management  Plan:  Review of chart, vital signs, medications, and notes. 1-Individual and group therapy 2-Medication management for depression and anxiety:  Medications reviewed with the patient and no adjustments made.  Patient was found in another patient's room this morning (even though he knows this is against the rules) and it appeared he was taking a medication from him. 3-Coping skills for depression, anxiety, and pain--no pain noted today 4-Continue crisis stabilization and management; denies suicidal ideations 5-Address health issues--monitoring seizures, none noted 6-Treatment plan in progress to prevent relapse of depression, substance abuse, and anxiety  Medical Decision Making Problem Points:  Established problem, stable/improving (1) and Review of psycho-social stressors (1) Data Points:  Review of medication regiment & side effects (2)  I certify that inpatient services furnished can reasonably be expected to improve the patient's condition.   [name], PMH_NP 05/10/2012, 3:05 PM

## 2012-05-11 NOTE — Progress Notes (Signed)
Psychoeducational Group Note  Date:  05/11/2012 Time:  2015  Group Topic/Focus:  Wrap-up  Participation Level:  Active  Participation Quality:  Intrusive and Disruptive  Affect:  Labile  Cognitive:  Alert  Insight:  Poor  Engagement in Group:  Distracting  Additional Comments:  Pt spoke when other patients were trying to share. He was intrusive, labile when asked by another patient to be quiet.   Humberto Seals Monique 05/11/2012, 10:40 PM

## 2012-05-11 NOTE — Progress Notes (Signed)
(  D)Pt has been appropriate in affect, labile in mood this evening. Pt has been using the nicotine gum every hour and reports that he does the same at home. Pt shared that he was upset after talking to some of his family because they mentioned that he might need to be transferred to another hospital for long term treatment. Pt asked appropriate questions such as what hospital is in [place] and do I have a say in going somewhere else. Pt reported that he doesn't want long term treatment and wants to go home. (A)Support and encouragement given. [name] questions were answered to the best of my ability and was referred to talk to the treatment team about his discharge/transfer plans. Pt was educated on safety and why hospitalizations may be neccessary. (R)Pt receptive. Pt de-escalated easily.

## 2012-05-11 NOTE — Progress Notes (Signed)
[facility] MD Progress Note  05/11/2012 11:03 AM [name] [name]  MRN:  [id number] Subjective:  6/10 depression, suicidal ideations "on and off" Diagnosis:   Axis I: Anxiety Disorder NOS, Depressive Disorder NOS, Substance Abuse and Substance Induced Mood Disorder Axis II: Deferred Axis III:  Past Medical History  Diagnosis Date  . Seizure   . Protein C deficiency   . Protein C deficiency    Axis IV: economic problems, housing problems, occupational problems, other psychosocial or environmental problems, problems related to legal system/crime, problems related to social environment and problems with primary support group Axis V: 41-50 serious symptoms  ADL's:  Intact  Sleep: Fair  Appetite:  Fair  Suicidal Ideation:  Plan:  None Intent:  Not while he is inpatient Means:  None Homicidal Ideation:  Denies  Psychiatric Specialty Exam: Review of Systems  Constitutional: Negative.   HENT: Negative.   Eyes: Negative.   Respiratory: Negative.   Cardiovascular: Negative.   Gastrointestinal: Negative.   Genitourinary: Negative.   Musculoskeletal:       Left lower leg pain at times  Skin: Negative.   Neurological: Negative.   Endo/Heme/Allergies: Negative.   Psychiatric/Behavioral: Positive for depression, suicidal ideas and substance abuse.    Blood pressure 109/72, pulse 67, temperature 98.5 F (36.9 C), temperature source Oral, resp. rate 18, height 5\' 8"  (1.727 m), weight 72.576 kg (160 lb).Body mass index is 24.33 kg/(m^2).  General Appearance: Casual  Eye Contact::  Fair  Speech:  Slow  Volume:  Decreased  Mood:  Anxious and Depressed  Affect:  Flat  Thought Process:  Intact  Orientation:  Full (Time, Place, and Person)  Thought Content:  WDL  Suicidal Thoughts:  Yes.  without intent/plan  Homicidal Thoughts:  No  Memory:  Immediate;   Fair Recent;   Fair Remote;   Fair  Judgement:  Fair  Insight:  Lacking  Psychomotor Activity:  Decreased  Concentration:  Fair    Recall:  Fair  Akathisia:  No  Handed:  Right  AIMS (if indicated):     Assets:  Resilience  Sleep:  Number of Hours: 6    Current Medications: Current Facility-Administered Medications  Medication Dose Route Frequency Provider Last Rate Last Dose  . acetaminophen (TYLENOL) tablet 650 mg  650 mg Oral Q6H PRN Rachael Fee, MD      . alum & mag hydroxide-simeth (MAALOX/MYLANTA) 200-200-20 MG/5ML suspension 30 mL  30 mL Oral Q4H PRN Rachael Fee, MD      . docusate sodium (COLACE) capsule 100 mg  100 mg Oral BID Rachael Fee, MD   100 mg at 05/11/12 0853  . folic acid (FOLVITE) tablet 1 mg  1 mg Oral Daily Rachael Fee, MD   1 mg at 05/11/12 0853  . lacosamide (VIMPAT) tablet 300 mg  300 mg Oral BID Rachael Fee, MD   300 mg at 05/11/12 0853  . levETIRAcetam (KEPPRA) tablet 2,000 mg  2,000 mg Oral BID Rachael Fee, MD   2,000 mg at 05/11/12 [phone]  . LORazepam (ATIVAN) tablet 0.5 mg  0.5 mg Oral BID [name], NP   0.5 mg at 05/11/12 0854  . magnesium hydroxide (MILK OF MAGNESIA) suspension 30 mL  30 mL Oral Daily PRN Rachael Fee, MD      . nicotine polacrilex (NICORETTE) gum 2 mg  2 mg Oral Q1H PRN Mickie D. Adams, PA   2 mg at 05/11/12 1028  . PHENobarbital (LUMINAL) tablet 64.8  mg  64.8 mg Oral QHS Rachael Fee, MD   64.8 mg at 05/10/12 2101  . phenytoin (DILANTIN) ER capsule 200 mg  200 mg Oral BID Rachael Fee, MD   200 mg at 05/11/12 0854  . risperiDONE (RISPERDAL) tablet 0.5 mg  0.5 mg Oral BID Himabindu Ravi, MD   0.5 mg at 05/11/12 0854  . Rivaroxaban (XARELTO) tablet 20 mg  20 mg Oral Q supper Rachael Fee, MD   20 mg at 05/10/12 1715  . sertraline (ZOLOFT) tablet 100 mg  100 mg Oral Daily Himabindu Ravi, MD   100 mg at 05/11/12 0854  . thiamine (VITAMIN B-1) tablet 100 mg  100 mg Oral Daily Rachael Fee, MD   100 mg at 05/11/12 0854  . topiramate (TOPAMAX) tablet 100 mg  100 mg Oral BID Rachael Fee, MD   100 mg at 05/11/12 4098    Lab Results: No results found for  this or any previous visit (from the past 48 hour(s)).  Physical Findings: AIMS: Facial and Oral Movements Muscles of Facial Expression: None, normal Lips and Perioral Area: None, normal Jaw: None, normal Tongue: None, normal,Extremity Movements Upper (arms, wrists, hands, fingers): None, normal Lower (legs, knees, ankles, toes): None, normal, Trunk Movements Neck, shoulders, hips: None, normal, Overall Severity Severity of abnormal movements (highest score from questions above): None, normal Incapacitation due to abnormal movements: None, normal Patient's awareness of abnormal movements (rate only patient's report): No Awareness, Dental Status Current problems with teeth and/or dentures?: No Does patient usually wear dentures?: No  CIWA:  CIWA-Ar Total: 0  COWS:  COWS Total Score: 0   Treatment Plan Summary: Daily contact with patient to assess and evaluate symptoms and progress in treatment Medication management  Plan:  Review of chart, vital signs, medications, and notes. 1-Individual and group therapy 2-Medication management for depression and anxiety:  Medications reviewed with the patient and no adjustments made.  Morphine discontinued after Saturday's dosage, no sign and symptoms of pain noted, no pain yesterday, did state today that he had some in his left, lower calf--encouraged him to use tylenol--walking not affected and no behavioral cues.  Despite multiple changes, patient continues to have "off and on" suicidal thoughts, wants long-term treatment.  Social worker is looking to refer him to [facility] since his status has not changed.  Patient continues to have drug seeking behaviors--frequents medication room and lingers outside the doors. 3-Coping skills for depression and substance abuse. 4-Continue crisis stabilization and management; supportive environment in place to optimize care. 5-Address health issues--monitoring seizures, none noted 6-Treatment plan  in progress to prevent relapse of depression and substance abuse  Medical Decision Making Problem Points:  Established problem, worsening (2) and Review of psycho-social stressors (1) Data Points:  Review of medication regiment & side effects (2)  I certify that inpatient services furnished can reasonably be expected to improve the patient's condition.   [name], PMH-NP 05/11/2012, 11:03 AM

## 2012-05-11 NOTE — Progress Notes (Signed)
[facility] Group Notes:  (Counselor/Nursing/MHT/Case Management/Adjunct)  05/11/2012 12:22 AM  Type of Therapy:  Psychoeducational Skills  Participation Level:  Active  Participation Quality:  Appropriate  Affect:  Appropriate  Cognitive:  Appropriate  Insight:  Developing/Improving  Engagement in Group:  Engaged  Engagement in Therapy:  Engaged  Modes of Intervention:  Exploration  Summary of Progress/Problems:The patient described his day as being a "chill day". He states that the medication is working well. His goal for tomorrow is to speak with his physician regarding his after care plan.   [name] [name] S 05/11/2012, 12:22 AM

## 2012-05-11 NOTE — Progress Notes (Addendum)
D: PAtient has been Dentist.  Patient is calm and cooperative when nurse is administering medications but easily gets upset or angry when spoken to about his behavior. checked the patient's room and on the desk found white residue and one single joker playing card. Unable to identify the white powder residue charge nurse notified. Also patient took his bedtime medications and was asked to spit out his nicotine gum first before taking his medications. Patient took his medications with no problem and then put his gum back in his mouth. Approx. 5 minutes later the MHT notifed the RN that patient shared ice cream with another patient and while the MHT was telling RN the patient came to the window again to ask for another piece of nicotine gum and previous piece had been discarded per patient. Patient also observed hugging a male patient.  Patient also came to the nurse and asked if RN would report that he had been bad tonight.  Patient states he is depressed.  Patient denies SI/HI and denies AVH.   A: Staff to monitor Q 15 mins for safety.  Encouragement and support offered.  Patient attended group tonight.  Patient taking administered medications.  Nicotine administered several times prn for nicotine withdrawal.  Patient instructed to tidy up his room.  Patient also instructed to when he is finished with his nicotine gum the nurse needs to see him spit it out. R: Patient remains safe on the unit.  Patient agreed to all instructions.  Patient apologized to Clinical research associate for his behavior.  Patient agreed to instructions of cleaning his room and patient has started throwing his gum away in the trash in the medication room.

## 2012-05-11 NOTE — Progress Notes (Signed)
I agree with this note.  

## 2012-05-11 NOTE — Progress Notes (Signed)
[facility] LCSW Group Therapy  05/11/2012 3:40 PM  Type of Therapy:  Group Therapy  Patient did not attend group.  Wynn Banker 05/11/2012, 3:40 PM

## 2012-05-11 NOTE — Progress Notes (Signed)
Patient ID: [name] [name], male   DOB: [birth year], 32 y.o.   MRN: [id number] D- Patient reports sleeping well and appetite is poor.  His energy level is normal and his ability to pay attention is good.  He rates his depression and hopelessness a 6.  A- Talked with patient about the grieving process.  R- He states that he fluctuates a lot in his feelings , some days feeling okay and then other days feeling very sad.  Also talked with patient about his hourly use of nicotine gum.  Encouraged patient to try to extend period between gum use.  Patient said he woud try and has done so.

## 2012-05-11 NOTE — Tx Team (Signed)
Interdisciplinary Treatment Plan Update (Adult)  Date:  05/11/2012  Time Reviewed:  10:17 AM   Progress in Treatment: Attending groups:   Yes   Participating in groups:  Yes Taking medication as prescribed:  Yes Tolerating medication:  Yes Family/Significant othe contact made: Contact made with family Patient understands diagnosis:  Yes Discussing patient identified problems/goals with staff: Yes Medical problems stabilized or resolved: Yes Denies suicidal/homicidal ideation:No but contracts for safety Issues/concerns per patient self-inventory:  Other:   New problem(s) identified:  Reason for Continuation of Hospitalization: Anxiety Depression Medication stabilization Suicidal ideation  Interventions implemented related to continuation of hospitalization:  Medication Management; safety checks q 15 mins  Additional comments:  Estimated length of stay:  2-3 days  Discharge Plan:  Home with outpatient follow up  New goal(s):  Review of initial/current patient goals per problem list:    1.  Goal(s): Eliminate SI/other thoughts of self harm   Met:  No  Target date: d/c  As evidenced by: Patient will no longer endorse SI/HI or other thoughts of self harm.    2.  Goal (s):Reduce depression/anxiety  Met: Yes  Target date: d/c  As evidenced by: Patient will rate symptoms at four or below    3.  Goal(s):.stabilize on meds   Met:  No  Target date: d/c  As evidenced by: Patient will report being stabilized on medications - less symptomatic    4.  Goal(s): Refer for outpatient follow up   Met: Yes  Target date: d/c  As evidenced by: Follow up appointment scheduled    Attendees: Patient:   05/11/2012 10:17 AM  Physican:  Patrick North, MD 05/11/2012 10:17 AM  Nursing:  , RN 05/11/2012 10:17 AM   Nursing:   , RN 05/11/2012 10:17 AM   Clinical Social Worker:  Juline Patch, LCSW 05/11/2012 10:17 AM   Other: Tera Helper, PHM-NP 05/11/2012 10:17 AM     Other:   05/11/2012 10:17 AM Other:        05/11/2012 10:17 AM

## 2012-05-12 NOTE — Clinical Social Work Note (Signed)
[facility] LCSW Aftercare Discharge Planning Group Note       8:30-9:30 AM  1/7/20144:59 PM  Participation Quality:  Appropriate  Affect:  Appropriate  Cognitive:  Appropriate  Insight:  Engaged  Engagement in Group:  Engaged  Modes of Intervention:  Education, Exploration, Geophysicist/field seismologist of Progress/Problems:  Patient continues to endorse off/on SI and contracts for safety.  He rates depression at six and anxiety at nine.  Wynn Banker 05/12/2012 4:59 PM

## 2012-05-12 NOTE — Progress Notes (Signed)
Reviewed

## 2012-05-12 NOTE — Progress Notes (Signed)
Psychoeducational Group Note  Date:  05/12/2012 Time:  2000  Group Topic/Focus:  Wrap-Up Group:   The focus of this group is to help patients review their daily goal of treatment and discuss progress on daily workbooks.  Participation Level:  Active  Participation Quality:  Appropriate  Affect:  Appropriate  Cognitive:  Appropriate  Insight:  Engaged  Engagement in Group:  Engaged  Additional Comments:  Pt participated in wrap-up group this evening.   [name] [name] A 05/12/2012, 10:11 PM

## 2012-05-12 NOTE — Progress Notes (Signed)
D: Patient at the medication window on first approach.  Patient wanted Nicorette gum.  Patient states he has had a better today.  Patient states he has worked on his temper today.  Patient appears calm and pleasant on first approach.  Patient denies SI/HI and denies AVH.  Patient's behavior had been good tonight and patient mentioned  that his behavior was good and Clinical research associate encouraged patient's good behavior.  After patient left the medication window after the conversation patient went into another male patient's room and sat on his bed and Patience MHT told patient to come out of the room and stated it was inappropriate.  Writer spoke with patient in the hallway and patient used profanity stating, "The F**ing bed I have in may room is hard compared to his. Teacher, adult education spoke to patient about using profanity.  Patient states writer is always picking on him.  Writer explained to patient that he has to follow the rules at [facility] and that this was a breech in the treament agreement.  Patient agreed that he would not go into other patient's room from now on.  Patient has been using profanity loudly on the unit.  Writer also overheard patient tell another patient to, "Shannan Harper yourself with a spoon."  When Clinical research associate addressed this issue with the patient he states he was just playing however patient he said this remark to was visibly upset.  Patient states, "You are making me crazy, Discharge me then, discharge me then.  Patient has been more calm after both incidents where writer had to speak to patient individually.  Patient denies SI/HI and denies AVH. A: Staff to monitor Q 15 mins for safety.  Encouragement and support offered.  Scheduled medications administered per orders.  Nicotine gum given prn several times tonight. R: Patient remains safe on the unit.  Patient appears angry and at times he sways from side to side when speaking to Clinical research associate.  Patient attended group tonight.  Patient taking administered medications.

## 2012-05-12 NOTE — Progress Notes (Signed)
Nurse strongly encouraged patient to go to dining room for dinner tonight, but patient refused.   Stated he ate a large lunch.  Patient did not go to recreation this afternoon.   Patient did attend group this afternoon.

## 2012-05-12 NOTE — Progress Notes (Signed)
Psychoeducational Group Note  Date:  05/12/2012 Time: 1100  Group Topic/Focus:  Recovery Goals:   The focus of this group is to identify appropriate goals for recovery and establish a plan to achieve them.  Participation Level:  Active  Participation Quality:  Appropriate and Attentive  Affect:  Appropriate  Cognitive:  Alert and Appropriate  Insight:  Engaged  Engagement in Group:  Engaged  Additional Comments:   Pt. Listened and participated in group activity about setting goals for recovery.  Ruta Hinds [facility] 05/12/2012, 2:15 PM

## 2012-05-12 NOTE — Progress Notes (Signed)
Patient agitated this AM when writer speaks to him about him being awake tonight and getting nicotine gum.  Patient automatically states that his roommate came to Clinical research associate and stated he kept him awake.  Patient then began using profanity and talking loud in the hallway.  Writer was able to deescalate patient and kept patient sit into the dayroom.  Patient assured writer that the matter was done and over with and states he would not say anything to his roommate. Patient was encouraged to count to three before he speaks.  Several times tonight when writer tried to set limits and boundaries with patient he instantly became upset.  Patient has also been instructed to bring old nicotine gum to the window to be discarded.

## 2012-05-12 NOTE — Progress Notes (Signed)
Patient ID: [name] [name], male   DOB: [birth year], 32 y.o.   MRN: [id number] D- Patient reports poor sleep.  He reports his depression is 8 and hopelessness a 7.  He rates his anxiety a 2.  A- Talked with patient and a peer about  Taking advantage of time here and getting the most they can out of the program.  R- patient upset thinking he was being accused of things he did not do. Looked tearful.  Encouraged pt to let go of things that don't apply and take responsibility for things that do.  Patient ruminating about talk and then talked with patient about coping skills to to interrupt worrying and ruminating and having thoughts that things are unfair turn to anger.  Talked with patient about using the 12 steps as a coping skill.  Patient  verbalized understanding of how this works and and says he will try it.  Returned to group after 1:1

## 2012-05-12 NOTE — Clinical Social Work Note (Signed)
[facility] LCSW Group Therapy            05/12/2012 5:00 PM    Type of Therapy:  Group Therapy  Participation Level:  Appropriate  Participation Quality:  Appropriate  Affect:  Appropriate  Cognitive:  Attentive Appropriate  Insight:  Engaged  Engagement in Therapy:  Engaged  Modes of Intervention:  Discussion Exploration Problem-Solving Supportive  Summary of Progress/Problems:  Patient shared that he is the obstacle he needs to overcome.  He sates he has to move forward but does not know how.  Wynn Banker 05/12/2012 5:00 PM

## 2012-05-12 NOTE — Progress Notes (Signed)
[facility] MD Progress Note  05/12/2012 1:23 PM [name] [name]  MRN:  [id number] Subjective:  7/10 depression, 4/10 anxiety with "off and on suicidal thoughts" Diagnosis:   Axis I: Anxiety Disorder NOS, Depressive Disorder NOS, Substance Abuse and Substance Induced Mood Disorder Axis II: Deferred Axis III:  Past Medical History  Diagnosis Date  . Seizure   . Protein C deficiency   . Protein C deficiency    Axis IV: economic problems, housing problems, occupational problems, other psychosocial or environmental problems, problems related to legal system/crime, problems related to social environment and problems with primary support group Axis V: 41-50 serious symptoms  ADL's:  Intact  Sleep: Fair  Appetite:  Fair  Suicidal Ideation:  Plan:  None Intent:  None Means:  None Homicidal Ideation:  Denies  Psychiatric Specialty Exam: Review of Systems  Constitutional: Negative.   HENT: Negative.   Eyes: Negative.   Respiratory: Negative.   Cardiovascular: Negative.   Gastrointestinal: Negative.   Genitourinary: Negative.   Musculoskeletal: Negative.        1/10 left lower leg pain  Skin: Negative.   Neurological: Negative.   Endo/Heme/Allergies: Negative.   Psychiatric/Behavioral: Positive for depression, suicidal ideas and substance abuse. The patient is nervous/anxious.     Blood pressure 147/99, pulse 77, temperature 98.1 F (36.7 C), temperature source Oral, resp. rate 16, height 5\' 8"  (1.727 m), weight 72.576 kg (160 lb).Body mass index is 24.33 kg/(m^2).  General Appearance: Casual  Eye Contact::  Fair  Speech:  Normal Rate  Volume:  Decreased  Mood:  Anxious and Depressed  Affect:  Flat  Thought Process:  Coherent  Orientation:  Full (Time, Place, and Person)  Thought Content:  WDL  Suicidal Thoughts:  Yes.  without intent/plan  Homicidal Thoughts:  No  Memory:  Immediate;   Fair Recent;   Fair Remote;   Fair  Judgement:  Fair  Insight:  Lacking  Psychomotor  Activity:  Decreased  Concentration:  Fair  Recall:  Fair  Akathisia:  No  Handed:  Right  AIMS (if indicated):     Assets:  Communication Skills Resilience  Sleep:  Number of Hours: 3    Current Medications: Current Facility-Administered Medications  Medication Dose Route Frequency Provider Last Rate Last Dose  . acetaminophen (TYLENOL) tablet 650 mg  650 mg Oral Q6H PRN Rachael Fee, MD      . alum & mag hydroxide-simeth (MAALOX/MYLANTA) 200-200-20 MG/5ML suspension 30 mL  30 mL Oral Q4H PRN Rachael Fee, MD      . docusate sodium (COLACE) capsule 100 mg  100 mg Oral BID Rachael Fee, MD   100 mg at 05/12/12 0800  . folic acid (FOLVITE) tablet 1 mg  1 mg Oral Daily Rachael Fee, MD   1 mg at 05/12/12 0801  . lacosamide (VIMPAT) tablet 300 mg  300 mg Oral BID Rachael Fee, MD   300 mg at 05/12/12 0759  . levETIRAcetam (KEPPRA) tablet 2,000 mg  2,000 mg Oral BID Rachael Fee, MD   2,000 mg at 05/12/12 0800  . LORazepam (ATIVAN) tablet 0.5 mg  0.5 mg Oral BID [name], NP   0.5 mg at 05/12/12 0800  . magnesium hydroxide (MILK OF MAGNESIA) suspension 30 mL  30 mL Oral Daily PRN Rachael Fee, MD      . nicotine polacrilex (NICORETTE) gum 2 mg  2 mg Oral Q1H PRN Mickie D. Adams, PA   2 mg at 05/12/12  1242  . PHENobarbital (LUMINAL) tablet 64.8 mg  64.8 mg Oral QHS Rachael Fee, MD   64.8 mg at 05/11/12 2105  . phenytoin (DILANTIN) ER capsule 200 mg  200 mg Oral BID Rachael Fee, MD   200 mg at 05/12/12 0800  . risperiDONE (RISPERDAL) tablet 0.5 mg  0.5 mg Oral BID Himabindu Ravi, MD   0.5 mg at 05/12/12 0800  . Rivaroxaban (XARELTO) tablet 20 mg  20 mg Oral Q supper Rachael Fee, MD   20 mg at 05/11/12 1705  . sertraline (ZOLOFT) tablet 100 mg  100 mg Oral Daily Himabindu Ravi, MD   100 mg at 05/12/12 0800  . thiamine (VITAMIN B-1) tablet 100 mg  100 mg Oral Daily Rachael Fee, MD   100 mg at 05/12/12 0800  . topiramate (TOPAMAX) tablet 100 mg  100 mg Oral BID Rachael Fee, MD    100 mg at 05/12/12 0801    Lab Results: No results found for this or any previous visit (from the past 48 hour(s)).  Physical Findings: AIMS: Facial and Oral Movements Muscles of Facial Expression: None, normal Lips and Perioral Area: None, normal Jaw: None, normal Tongue: None, normal,Extremity Movements Upper (arms, wrists, hands, fingers): None, normal Lower (legs, knees, ankles, toes): None, normal, Trunk Movements Neck, shoulders, hips: None, normal, Overall Severity Severity of abnormal movements (highest score from questions above): None, normal Incapacitation due to abnormal movements: None, normal Patient's awareness of abnormal movements (rate only patient's report): No Awareness, Dental Status Current problems with teeth and/or dentures?: No Does patient usually wear dentures?: No  CIWA:  CIWA-Ar Total: 0  COWS:  COWS Total Score: 0   Treatment Plan Summary: Daily contact with patient to assess and evaluate symptoms and progress in treatment Medication management  Plan:  Review of chart, vital signs, medications, and notes. 1-Individual and group therapy 2-Medication management for depression and anxiety:  Medications reviewed with the patient and he has had some improvement. 3-Coping skills for depression, anxiety, and pain 4-Continue crisis stabilization and management; reports suicidal thoughts like a "light switch" 5-Address health issues--monitoring seizures, none noted 6-Treatment plan in progress to prevent relapse of depression, substance abuse, and anxiety  Medical Decision Making Problem Points:  Established problem, stable/improving (1) and Review of psycho-social stressors (1) Data Points:  Review of medication regiment & side effects (2)  I certify that inpatient services furnished can reasonably be expected to improve the patient's condition.   [name], PMH-NP 05/12/2012, 1:23 PM

## 2012-05-13 DIAGNOSIS — Z634 Disappearance and death of family member: Secondary | ICD-10-CM

## 2012-05-13 DIAGNOSIS — F411 Generalized anxiety disorder: Secondary | ICD-10-CM

## 2012-05-13 DIAGNOSIS — F329 Major depressive disorder, single episode, unspecified: Principal | ICD-10-CM

## 2012-05-13 MED ORDER — SERTRALINE HCL 100 MG PO TABS: 100.0000 mg | ORAL_TABLET | Freq: Every day | ORAL | Status: AC

## 2012-05-13 MED ORDER — DSS 100 MG PO CAPS: 100.0000 mg | ORAL_CAPSULE | Freq: Two times a day (BID) | ORAL | Status: AC

## 2012-05-13 MED ORDER — LORAZEPAM 0.5 MG PO TABS: 0.5000 mg | ORAL_TABLET | Freq: Two times a day (BID) | ORAL | Status: AC

## 2012-05-13 MED ORDER — FOLIC ACID 1 MG PO TABS: 1.0000 mg | ORAL_TABLET | Freq: Every day | ORAL | Status: AC

## 2012-05-13 MED ORDER — TOPIRAMATE 100 MG PO TABS: 100.0000 mg | ORAL_TABLET | Freq: Two times a day (BID) | ORAL | Status: AC

## 2012-05-13 MED ORDER — TOPIRAMATE 100 MG PO TABS: 100.0000 mg | ORAL_TABLET | Freq: Two times a day (BID) | ORAL | Status: DC

## 2012-05-13 MED ORDER — PHENOBARBITAL 64.8 MG PO TABS: 64.8000 mg | ORAL_TABLET | Freq: Every day | ORAL | Status: AC

## 2012-05-13 MED ORDER — RIVAROXABAN 20 MG PO TABS: 20.0000 mg | ORAL_TABLET | Freq: Every evening | ORAL | Status: AC

## 2012-05-13 MED ORDER — THIAMINE HCL 100 MG PO TABS: 100.0000 mg | ORAL_TABLET | Freq: Every day | ORAL | Status: AC

## 2012-05-13 MED ORDER — LEVETIRACETAM 1000 MG PO TABS: 2000.0000 mg | ORAL_TABLET | Freq: Two times a day (BID) | ORAL | Status: AC

## 2012-05-13 MED ORDER — RISPERIDONE 0.5 MG PO TABS: 0.5000 mg | ORAL_TABLET | Freq: Two times a day (BID) | ORAL | Status: AC

## 2012-05-13 MED ORDER — PHENYTOIN SODIUM EXTENDED 200 MG PO CAPS: 200.0000 mg | ORAL_CAPSULE | Freq: Two times a day (BID) | ORAL | Status: DC

## 2012-05-13 MED ORDER — LACOSAMIDE 150 MG PO TABS: 300.0000 mg | ORAL_TABLET | Freq: Two times a day (BID) | ORAL | Status: AC

## 2012-05-13 NOTE — Progress Notes (Signed)
[facility] INPATIENT:  Family/Significant Other Suicide Prevention Education  Suicide Prevention Education:  Patient Refusal for Family/Significant Other Suicide Prevention Education: The patient [name] [name] has refused to provide written consent for family/significant other to be provided Family/Significant Other Suicide Prevention Education during admission and/or prior to discharge.  Physician notified.  Patient advised he does not want any involvement with family.  Wynn Banker 05/13/2012, 12:38 PM

## 2012-05-13 NOTE — Tx Team (Signed)
Interdisciplinary Treatment Plan Update (Adult)  Date:  05/13/2012  Time Reviewed:  10:23 AM   Progress in Treatment: Attending groups:   Yes   Participating in groups:  Yes Taking medication as prescribed:  Yes Tolerating medication:  Yes Family/Significant othe contact made: Patient denies contact with family Patient understands diagnosis:  Yes Discussing patient identified problems/goals with staff: Yes Medical problems stabilized or resolved: Yes Denies suicidal/homicidal ideation:No.  Patient continues to endorse SI Issues/concerns per patient self-inventory:  Other:   New problem(s) identified:  Reason for Continuation of Hospitalization: Anxiety Depression Medication stabilization Suicidal ideation  Interventions implemented related to continuation of hospitalization:  Medication Management; safety checks q 15 mins  Additional comments:  Estimated length of stay:  2-3 days  Discharge Plan:  Home with outpatient follow up  New goal(s):  Review of initial/current patient goals per problem list:    1.  Goal(s): Eliminate SI/other thoughts of self harm   Met:  No  Target date: d/c  As evidenced by: Patient will no longer endorsing SI/HI or other thoughts of self harm.    2.  Goal (s):Reduce depression/anxiety (patient refused to participate in group today)  Met: Yes  Target date: d/c  As evidenced by: Patient will rate symptoms at four or below    3.  Goal(s):.stabilize on meds   Met:  No  Target date: d/c  As evidenced by: Patient will report being stabilized on medications - less symptomatic    4.  Goal(s): Refer to be made to [facility]   Met:  No  Target date: d/c  As evidenced by: Awaiting review and acceptance for admission  Attendees: Patient:   05/13/2012 10:23 AM  Physican:  Patrick North, MD 05/13/2012 10:23 AM  Nursing:  Berneice Heinrich , RN 05/13/2012 10:23 AM   Nursing:  Leighton Parody, RN 05/13/2012 10:23 AM   Clinical Social  Worker:  Juline Patch, LCSW 05/13/2012 10:23 AM   Other: Tera Helper, PHM-NP 05/13/2012 10:23 AM   Other:  Royal [facility], DON     05/13/2012 10:23 AM Other:        05/13/2012 10:23 AM

## 2012-05-13 NOTE — Progress Notes (Signed)
Patient denies SI/HI, denies A/V hallucinations. Patient verbalizes understanding of discharge instructions, follow up care and prescriptions. Patient given all belongings from [facility] locker. Patient escorted out by staff, transported by police department from [facility].

## 2012-05-13 NOTE — Progress Notes (Signed)
Patient was walking down the hall with his arm around a male patient at this time.  Writer acknowledged patient and patient became loud and began to use profanity stating he was not doing anything.  Writer explained to him once again about boundaries and the rules of the unit.  Patient began to escalate.  Patient states he is tired of Clinical research associate always targeting him and Midwife never say anything to anyone else on the unit.  Patient states, "I am tired of this Bull S**T, discharge me, I will say whatever they want to hear so they will discharge me.  Patient states he did not ask to be here and states you said I was here relaxing."  Writer explained to patient that is not what she said.  Wrtier stated to patient, "You are not following the rules and when I call you out or try to tell you are breaking the rules you begin to disrespct me by yelling and using curse words."  Patietn then apologized to writer and became tearful.  Charge nurse did intervene to help with deesculation.  Patient appeared to calm down but then left medication window and began to talk with other patients about the incident and writer overheard him state, "I am going to tell them to discharge me because I cannot take this anymore."  Patient did was less angry and did calm down.  Patient sat in the dayroom during breakfast.

## 2012-05-13 NOTE — [facility] Suicide Risk Assessment (Signed)
Suicide Risk Assessment  Discharge Assessment     Demographic Factors:  Male, Caucasian, Low socioeconomic status, Living alone and Unemployed  Mental Status Per Nursing Assessment::   On Admission:  Suicidal ideation indicated by patient;Suicide plan  Current Mental Status by Physician: Patient alert and oriented to 4. Denies AH/VH/SI/HI.  Loss Factors: Loss of significant relationship  Historical Factors: Impulsivity  Risk Reduction Factors:   Positive coping skills or problem solving skills  Continued Clinical Symptoms:  Alcohol/Substance Abuse/Dependencies  Cognitive Features That Contribute To Risk:  Closed-mindedness Thought constriction (tunnel vision)    Suicide Risk:  Minimal: No identifiable suicidal ideation.  Patients presenting with no risk factors but with morbid ruminations; may be classified as minimal risk based on the severity of the depressive symptoms  Discharge Diagnoses:   AXIS I:  Depressive Disorder NOS and Substance Abuse AXIS II:  Deferred AXIS III:   Past Medical History  Diagnosis Date  . Seizure   . Protein C deficiency   . Protein C deficiency    AXIS IV:  economic problems, occupational problems and other psychosocial or environmental problems AXIS V:  61-70 mild symptoms  Plan Of Care/Follow-up recommendations:  Activity:  normal Diet:  normal Follow up with [facility] for outpatient.  Is patient on multiple antipsychotic therapies at discharge:  No   Has Patient had three or more failed trials of antipsychotic monotherapy by history:  No  Recommended Plan for Multiple Antipsychotic Therapies: NA  Shanecia Hoganson 05/13/2012, 11:12 AM

## 2012-05-13 NOTE — [facility] Suicide Risk Assessment (Signed)
Suicide Risk Assessment  Discharge Assessment     Demographic Factors:  Male, Caucasian, Low socioeconomic status and Living alone  Mental Status Per Nursing Assessment::   On Admission:  Suicidal ideation indicated by patient;Suicide plan  Current Mental Status by Physician: Patient alert and oriented to 4. Denies AH/VH/SI/HI.  Loss Factors: Loss of significant relationship and Financial problems/change in socioeconomic status  Historical Factors: Impulsivity  Risk Reduction Factors:   Positive coping skills or problem solving skills  Continued Clinical Symptoms:  Alcohol/Substance Abuse/Dependencies  Cognitive Features That Contribute To Risk:  Thought constriction (tunnel vision)    Suicide Risk:  Minimal: No identifiable suicidal ideation.  Patients presenting with no risk factors but with morbid ruminations; may be classified as minimal risk based on the severity of the depressive symptoms  Discharge Diagnoses:   AXIS I:  Depressive Disorder NOS and Substance Abuse AXIS II:  Deferred AXIS III:   Past Medical History  Diagnosis Date  . Seizure   . Protein C deficiency   . Protein C deficiency    AXIS IV:  occupational problems AXIS V:  61-70 mild symptoms  Plan Of Care/Follow-up recommendations:  Activity:  normal Diet:  normal  Is patient on multiple antipsychotic therapies at discharge:  No   Has Patient had three or more failed trials of antipsychotic monotherapy by history:  No  Recommended Plan for Multiple Antipsychotic Therapies: NA  Chantavia Bazzle 05/13/2012, 11:52 AM

## 2012-05-13 NOTE — Discharge Summary (Signed)
Physician Discharge Summary Note  Patient:  [name] [name] is an 32 y.o., male MRN:  [id number] DOB:  09-25-1980 Patient phone:  [phone] (home)  Patient address:   [address] [place] [address],   Date of Admission:  04/20/2012 Date of Discharge: 05/13/2012  Reason for Admission:  Depression, bereavement, suicidal ideations, substance dependency/abuse  Discharge Diagnoses: Principal Problem:  *Unresolved grief  Review of Systems  Constitutional: Negative.   HENT: Negative.   Eyes: Negative.   Respiratory: Negative.   Cardiovascular: Negative.   Gastrointestinal: Negative.   Genitourinary: Negative.   Musculoskeletal: Negative.   Skin: Negative.   Neurological: Negative.   Endo/Heme/Allergies: Negative.   Psychiatric/Behavioral: Positive for depression and substance abuse.   Axis Diagnosis:   AXIS I:  Anxiety Disorder NOS, Bereavement, Depressive Disorder NOS, Substance Abuse and Substance Induced Mood Disorder AXIS II:  Deferred AXIS III:   Past Medical History  Diagnosis Date  . Seizure   . Protein C deficiency   . Protein C deficiency    AXIS IV:  economic problems, housing problems, occupational problems, other psychosocial or environmental problems, problems related to legal system/crime, problems related to social environment and problems with primary support group AXIS V:  51-60 moderate symptoms  Level of Care:  OP  Hospital Course:  Review of chart, vital signs, medications, and notes. 1-Individual and group therapy completed 2-Medication management for depression and substance abuse/dependency:  Medications reviewed with the patient and no untoward effects.  Rx given at discharge for 3 days based on his follow-up appointment being to jail where they can continue his medications. 3-Coping skills for depression and substance abuse/dependency developed and utilized.  However, patient continues to have drug seeking behaviors on the unit, attempting to  get other patient's medications from them for himself. 4-Crisis stabilization and management; patient stated his suicidal ideations were like a "light switch", could turn them on and off.  He has never attempted suicide in the past, only has threatened during this hospitalization with no intent or stable plan.  When the patient gets upset, he tends to threaten suicide--"by blowing out my brains", despite him denying gun or gun access and by overdosing (on his sample medications), Rx written but no medications given at discharge to prevent this.   5-Address health issues--monitoring seizures, stable 6-Treatment plan in place to prevent relapse of depression and substance abuse. 7-Patient denied suicidal/homicidal ideations and auditory/visual hallucinations on discharge, patient requested discharge today and a bus ticket or fare back to his apartment in [place].  He asked the medical director and his regular MD for this request.  The medical director asked this practitioner to call his probation officer, Jola Baptist at [phone]) prior to his discharge to warn them of his past threats to harm himself; to further assure the patient's safety.  When the probation officer was called, this practitioner was informed that he was wanted for 2 charges and they would send police to serve and arrest him at discharge.  The patient was released to their custody and will monitor his safety at this point if he becomes suicidal.  Consults:  neurology  Significant Diagnostic Studies:  labs: completed and reviewed, stable  Discharge Vitals:   Blood pressure 144/89, pulse 70, temperature 97.4 F (36.3 C), temperature source Oral, resp. rate 18, height 5\' 8"  (1.727 m), weight 72.576 kg (160 lb). Body mass index is 24.33 kg/(m^2). Lab Results:   No results found for this or any previous visit (from the past 72  hour(s)).  Physical Findings: AIMS: Facial and Oral Movements Muscles of Facial Expression: None,  normal Lips and Perioral Area: None, normal Jaw: None, normal Tongue: None, normal,Extremity Movements Upper (arms, wrists, hands, fingers): None, normal Lower (legs, knees, ankles, toes): None, normal, Trunk Movements Neck, shoulders, hips: None, normal, Overall Severity Severity of abnormal movements (highest score from questions above): None, normal Incapacitation due to abnormal movements: None, normal Patient's awareness of abnormal movements (rate only patient's report): No Awareness, Dental Status Current problems with teeth and/or dentures?: No Does patient usually wear dentures?: No  CIWA:  CIWA-Ar Total: 0  COWS:  COWS Total Score: 0   Psychiatric Specialty Exam: See Psychiatric Specialty Exam and Suicide Risk Assessment completed by Attending Physician prior to discharge.  Discharge destination:  Home  Is patient on multiple antipsychotic therapies at discharge:  No   Has Patient had three or more failed trials of antipsychotic monotherapy by history:  No Recommended Plan for Multiple Antipsychotic Therapies:  N/A  Discharge Orders    Future Orders Please Complete By Expires   Diet - low sodium heart healthy      Diet - low sodium heart healthy      Activity as tolerated - No restrictions      Activity as tolerated - No restrictions          Medication List     As of 05/13/2012 11:28 AM    TAKE these medications      Indication    DSS 100 MG Caps   Take 100 mg by mouth 2 (two) times daily.    Indication: Constipation      folic acid 1 MG tablet   Commonly known as: FOLVITE   Take 1 tablet (1 mg total) by mouth daily.    Indication: Deficiency of Folic Acid in the Diet      Lacosamide 150 MG Tabs   Take 2 tablets (300 mg total) by mouth 2 (two) times daily.    Indication: Partial Onset Seizures      levETIRAcetam 1000 MG tablet   Commonly known as: KEPPRA   Take 2 tablets (2,000 mg total) by mouth every 12 (twelve) hours.    Indication: Tonic-Clonic  Seizures      LORazepam 0.5 MG tablet   Commonly known as: ATIVAN   Take 1 tablet (0.5 mg total) by mouth 2 (two) times daily.    Indication: Status Epilepticus      PHENobarbital 64.8 MG tablet   Commonly known as: LUMINAL   Take 1 tablet (64.8 mg total) by mouth at bedtime.    Indication: Simple Partial Seizures      phenytoin 200 MG ER capsule   Commonly known as: DILANTIN   Take 1 capsule (200 mg total) by mouth 2 (two) times daily.    Indication: Seizure      risperiDONE 0.5 MG tablet   Commonly known as: RISPERDAL   Take 1 tablet (0.5 mg total) by mouth 2 (two) times daily.    Indication: Easily Angered or Annoyed      Rivaroxaban 20 MG Tabs   Commonly known as: XARELTO   Take 1 tablet (20 mg total) by mouth every evening.    Indication: thrombosis prevention      sertraline 100 MG tablet   Commonly known as: ZOLOFT   Take 1 tablet (100 mg total) by mouth daily.    Indication: depression      thiamine 100 MG tablet   Take 1  tablet (100 mg total) by mouth daily.    Indication: vitamin deficiency      topiramate 100 MG tablet   Commonly known as: TOPAMAX   Take 1 tablet (100 mg total) by mouth 2 (two) times daily.    Indication: Partial Onset Seizures           Follow-up Information    Follow up with [facility]. (Patient is to go to the [facility] walk-in clinic at 8:45 AM on Monday, 05/04/2012)    Contact information:   [address] [name], [place] [address] [phone]         Follow-up recommendations:  Activity as tolerated, low-sodium heart healthy diet  Comments:  Patient will discharge and follow-up with [facility] for therapy and medications  Total Discharge Time:  Greater than 30 minutes  Signed: [name], PMH-NP 05/13/2012, 11:28 AM

## 2012-05-13 NOTE — Progress Notes (Signed)
[facility] Adult Case Management Discharge Plan :  Will you be returning to the same living situation after discharge: Yes,  Patient reports he will return to his home in [place] At discharge, do you have transportation home?:Yes,  Patient requested a city bus ticket.  He advised he is able to purchase a bus ticket to [place] Do you have the ability to pay for your medications:Yes,  Patient states he has money to purchase medications  Release of information consent forms completed and in the chart;  Patient's signature needed at discharge.  Patient to Follow up at: Follow-up Information    Follow up with [facility]. (Please go [facility] walk in clinic on Thursday, May 15, 2011 or any weekday between 8 AM - 3 PM.  )    Contact information:   [address] [place], [place] [address] [phone]      Follow up with [facility]. (Please contact [facility] in [place] at [phone] should you find yourself in a mental  health crisis.)    Contact information:   [phone]         Patient denies SI/HI:   Yes,  Patient endorses off/on SI but no intent to act on thoughts    Safety Planning and Suicide Prevention discussed:  Yes,  Reveiwed multiple times during aftercare groups.  Patient also provide with contact information for mobile crisis management.  Wynn Banker 05/13/2012, 12:24 PM

## 2012-05-15 ENCOUNTER — Emergency Department (HOSPITAL_COMMUNITY): Payer: Self-pay

## 2012-05-15 ENCOUNTER — Emergency Department (HOSPITAL_COMMUNITY)
Admission: EM | Admit: 2012-05-15 | Discharge: 2012-05-15 | Disposition: A | Discharge status: Home / Self Care | Payer: Self-pay | Attending: Emergency Medicine | Admitting: Emergency Medicine

## 2012-05-15 ENCOUNTER — Encounter (HOSPITAL_COMMUNITY): Payer: Self-pay | Admitting: *Deleted

## 2012-05-15 DIAGNOSIS — R569 Unspecified convulsions: Secondary | ICD-10-CM

## 2012-05-15 DIAGNOSIS — Z79899 Other long term (current) drug therapy: Secondary | ICD-10-CM | POA: Insufficient documentation

## 2012-05-15 DIAGNOSIS — F17200 Nicotine dependence, unspecified, uncomplicated: Secondary | ICD-10-CM | POA: Insufficient documentation

## 2012-05-15 DIAGNOSIS — R220 Localized swelling, mass and lump, head: Secondary | ICD-10-CM | POA: Insufficient documentation

## 2012-05-15 DIAGNOSIS — Z86718 Personal history of other venous thrombosis and embolism: Secondary | ICD-10-CM | POA: Insufficient documentation

## 2012-05-15 DIAGNOSIS — Z862 Personal history of diseases of the blood and blood-forming organs and certain disorders involving the immune mechanism: Secondary | ICD-10-CM | POA: Insufficient documentation

## 2012-05-15 DIAGNOSIS — G40909 Epilepsy, unspecified, not intractable, without status epilepticus: Secondary | ICD-10-CM | POA: Insufficient documentation

## 2012-05-15 MED ORDER — LORAZEPAM 2 MG/ML IJ SOLN
2.0000 mg | Freq: Once | INTRAMUSCULAR | Status: AC
  Administered 2012-05-15: 2 mg via INTRAMUSCULAR

## 2012-05-15 MED ORDER — LORAZEPAM 2 MG/ML IJ SOLN
INTRAMUSCULAR | Status: AC
  Filled 2012-05-15: qty 1

## 2012-05-15 NOTE — ED Notes (Signed)
Chaplain saw pt on rounds - familiar with pt from recent lengthy hospital admission between [facility], [facility] and [facility].  Chaplain plan to provide support around re-admission to ED, assess for continued support around loss of daughter and girlfriend to accident with drunk driver, and support around seizures.    Pt described being at courthouse/jail today "bonding out" of charge for failure to appear.  Pt had court dates that were missed during previous hospitalization.  Described having seizure at courthouse / jail.  Reported to chaplain that he "is all right," did not wish to be here, and that he was hoping to go home.  Chaplain provided brief support with pt and will continue to follow if admitted. Please page as needs arise.     [name] [name]  MDiv, Chaplain  

## 2012-05-15 NOTE — Progress Notes (Deleted)
Chaplain saw pt on rounds - familiar with pt from recent lengthy hospital admission between [facility], [facility] and [facility].  Chaplain plan to provide support around re-admission to ED, assess for continued support around loss of daughter and girlfriend to accident with drunk driver, and support around seizures.    Pt described being at courthouse/jail today "bonding out" of charge for failure to appear.  Pt had court dates that were missed during previous hospitalization.  Described having seizure at courthouse / jail.  Reported to chaplain that he "is all right," did not wish to be here, and that he was hoping to go home.  Chaplain provided brief support with pt and will continue to follow if admitted. Please page as needs arise.     Belva Crome  MDiv, Chaplain

## 2012-05-15 NOTE — ED Provider Notes (Signed)
History     CSN: [id number]  Arrival date & time 05/15/12  1421   First MD Initiated Contact with Patient 05/15/12 1550      Chief Complaint  Patient presents with  . 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 Today he was at the courthouse when he had apparently a few witnessed seizures with an hour. He now feels fine. He was walking her department prior to my evaluation. He is no complaints now. He did hit his head and has tenderness to his scalp with some scalp swelling without bleeding. He is no headache confusion neck pain back pain chest pain shortness breath abdominal pain. He is no pain to his extremities or weakness or numbness. He is no change in speech vision swallowing or understanding. He states he is difficult to control seizures and used to have seizures more than once a week but now has not had one until today in the last several weeks. Past Medical History  Diagnosis Date  . Seizure   . Protein C deficiency   . Protein C deficiency     Past Surgical History  Procedure Date  . Insertion of vena cava filter     History reviewed. No pertinent family history.  History  Substance Use Topics  . Smoking status: Current Every Day Smoker -- 0.2 packs/day for 5 years    Types: Cigarettes  . Smokeless tobacco: Never Used  . Alcohol Use: 3.6 oz/week    6 Cans of beer per week      Review of Systems 10 Systems reviewed and are negative for acute change except as noted in the HPI. Allergies  Fish allergy; Depakote; and Morphine and related  Home Medications   Current Outpatient Rx  Name  Route  Sig  Dispense  Refill  . DSS 100 MG PO CAPS   Oral   Take 100 mg by mouth 2 (two) times daily.   6 each   0   . FOLIC ACID 1 MG PO TABS   Oral   Take 1 tablet (1 mg  total) by mouth daily.   3 tablet   0   . LACOSAMIDE 150 MG PO TABS   Oral   Take 2 tablets (300 mg total) by mouth 2 (two) times daily.   6 tablet   0   . LEVETIRACETAM 1000 MG PO TABS   Oral   Take 2 tablets (2,000 mg total) by mouth every 12 (twelve) hours.   6 tablet   0   . LORAZEPAM 0.5 MG PO TABS   Oral   Take 1 tablet (0.5 mg total) by mouth 2 (two) times daily.   6 tablet   0   . PHENOBARBITAL 64.8 MG PO TABS   Oral   Take 1 tablet (64.8 mg total) by mouth at bedtime.   3 tablet   0   . PHENYTOIN SODIUM EXTENDED 100 MG PO CAPS   Oral   Take 200 mg by mouth 2 (two) times daily.         Marland Kitchen RISPERIDONE 0.5 MG PO TABS   Oral   Take 1 tablet (0.5 mg total) by mouth 2 (two) times daily.   60 tablet   0   . RIVAROXABAN 20 MG PO TABS   Oral   Take 1 tablet (20 mg total) by  mouth every evening.   3 tablet   0   . SERTRALINE HCL 100 MG PO TABS   Oral   Take 1 tablet (100 mg total) by mouth daily.   3 tablet   0   . THIAMINE HCL 100 MG PO TABS   Oral   Take 1 tablet (100 mg total) by mouth daily.   3 tablet   0   . TOPIRAMATE 100 MG PO TABS   Oral   Take 1 tablet (100 mg total) by mouth 2 (two) times daily.   6 tablet   0     BP 134/72  Pulse 81  Temp 99.5 F (37.5 C) (Oral)  Resp 18  SpO2 100%  Physical Exam  Nursing note and vitals reviewed. Constitutional:       Awake, alert, nontoxic appearance with baseline speech for patient.  HENT:  Mouth/Throat: No oropharyngeal exudate.       Tenderness with mild swelling to the vertex of the scalp with no laceration noted; no tongue biting  Eyes: EOM are normal. Pupils are equal, round, and reactive to light. Right eye exhibits no discharge. Left eye exhibits no discharge.  Neck: Neck supple.       Cervical spine and back nontender  Cardiovascular: Normal rate and regular rhythm.   No murmur heard. Pulmonary/Chest: Effort normal and breath sounds normal. No stridor. No respiratory distress.  He has no wheezes. He has no rales. He exhibits no tenderness.  Abdominal: Soft. Bowel sounds are normal. He exhibits no mass. There is no tenderness. There is no rebound.  Musculoskeletal: He exhibits no tenderness.       Baseline ROM, moves extremities with no obvious new focal weakness.  Lymphadenopathy:    He has no cervical adenopathy.  Neurological: He is alert.       Awake, alert, cooperative and aware of situation; motor strength bilaterally; sensation normal to light touch bilaterally; peripheral visual fields full to confrontation; no facial asymmetry; tongue midline; major cranial nerves appear intact; no pronator drift, normal finger to nose bilaterally  Skin: No rash noted.  Psychiatric: He has a normal mood and affect.    ED Course  Procedures (including critical care time) Brief witnessed generalized seizure in ED, pulse ox normal in 90s. 1610  Patient awake alert oriented x3 with normal speech and gait refuses blood draw and refuses head CT scan despite having possible life-threatening bleeding in his brain, the patient appears to have capacity to understand his right to refuse and the risk of his refusal of these recommended screening tests which I think are indicated. Patient will be leaving AGAINST MEDICAL ADVICE and appears to once again to have capacity to make that decision despite my disagreement with his decision to leave.1645 Labs Reviewed  GLUCOSE, CAPILLARY - Abnormal; Notable for the following:    Glucose-Capillary 134 (*)     All other components within normal limits  LAB REPORT - SCANNED   No results found.   1. Seizure       MDM          Hurman Horn, MD 05/16/12 1435

## 2012-05-15 NOTE — Progress Notes (Signed)
Reviewed

## 2012-05-15 NOTE — Progress Notes (Signed)
Patient Discharge Instructions:  After Visit Summary (AVS):   Faxed to:  05/15/12 Psychiatric Admission Assessment Note:   Faxed to:  05/15/12 Suicide Risk Assessment - Discharge Assessment:   Faxed to:  05/15/12 Faxed/Sent to the Next Level Care provider:  05/15/12 Faxed to [facility] @ [phone]  Jerelene Redden, 05/15/2012, 4:10 PM

## 2012-05-15 NOTE — ED Notes (Signed)
[id number]<AV> Expected date:<BR> Expected time:<BR> Means of arrival:<BR> Comments:<BR> seizure

## 2012-05-15 NOTE — ED Notes (Signed)
Pt wishing to leave against medical advice; MD notified and pt is aware that this can be very dangerous. Pt just had a seizure that lasted over 20 min with the initial seizure noted to last 8- 10 min while he was post itical for over 10 min. Pt c/o headaches from where he had hit his head from the previous seizure prior to coming here. MD spoke with the pt and the pt left with friends that came to pick him up. Pt signed paperwork that he understands his disease process and that he will take his medicine but that he is ok to go. States he will take his seizure medicine that the jail would not give to him when he gets home.

## 2012-05-15 NOTE — ED Notes (Signed)
Pt was picked up from [facility], staff state that he had a seizure. Per staff pt had approximately 5 witnessed seizures that started at 1330 ending at 1350. Pt supposedly loss function of bladder and had some dribbles in his underwear.  Pt hit his head during the occurrence and has a large hematoma in the back of his head. Pt was just on stretcher and was fully immobilized and removed the collar and board himself with out being cleared and came out of the room just now stating that he had to pee and poop. EMS accompanied pt to the restroom.

## 2012-05-16 ENCOUNTER — Emergency Department (HOSPITAL_COMMUNITY)
Admission: EM | Admit: 2012-05-16 | Discharge: 2012-05-18 | Disposition: A | Discharge status: Home / Self Care | Payer: Self-pay | Attending: Emergency Medicine | Admitting: Emergency Medicine

## 2012-05-16 ENCOUNTER — Encounter (HOSPITAL_COMMUNITY): Payer: Self-pay | Admitting: *Deleted

## 2012-05-16 DIAGNOSIS — R45851 Suicidal ideations: Secondary | ICD-10-CM | POA: Insufficient documentation

## 2012-05-16 DIAGNOSIS — F17200 Nicotine dependence, unspecified, uncomplicated: Secondary | ICD-10-CM | POA: Insufficient documentation

## 2012-05-16 DIAGNOSIS — G40909 Epilepsy, unspecified, not intractable, without status epilepticus: Secondary | ICD-10-CM | POA: Insufficient documentation

## 2012-05-16 DIAGNOSIS — Z8719 Personal history of other diseases of the digestive system: Secondary | ICD-10-CM | POA: Insufficient documentation

## 2012-05-16 DIAGNOSIS — Z8659 Personal history of other mental and behavioral disorders: Secondary | ICD-10-CM | POA: Insufficient documentation

## 2012-05-16 DIAGNOSIS — Z79899 Other long term (current) drug therapy: Secondary | ICD-10-CM | POA: Insufficient documentation

## 2012-05-16 DIAGNOSIS — F39 Unspecified mood [affective] disorder: Secondary | ICD-10-CM | POA: Insufficient documentation

## 2012-05-16 HISTORY — DX: Suicidal ideations: R45.851

## 2012-05-16 MED ORDER — ACETAMINOPHEN 325 MG PO TABS: 650.0000 mg | ORAL_TABLET | ORAL | Status: DC | PRN

## 2012-05-16 MED ORDER — ALUM & MAG HYDROXIDE-SIMETH 200-200-20 MG/5ML PO SUSP: 30.0000 mL | ORAL | Status: DC | PRN

## 2012-05-16 MED ORDER — ONDANSETRON HCL 4 MG PO TABS: 4.0000 mg | ORAL_TABLET | Freq: Three times a day (TID) | ORAL | Status: DC | PRN

## 2012-05-16 NOTE — ED Provider Notes (Signed)
History     CSN: [id number]  Arrival date & time 05/16/12  2243   First MD Initiated Contact with Patient 05/16/12 2334      Chief Complaint  Patient presents with  . Medical Clearance    (Consider location/radiation/quality/duration/timing/severity/associated sxs/prior treatment) Patient is a 32 y.o. male presenting with mental health disorder. The history is provided by the patient.  Mental Health Problem The primary symptoms include dysphoric mood. The primary symptoms do not include hallucinations or somatic symptoms. The current episode started this week. This is a recurrent problem.  The dysphoric mood began this week. The mood has been worsening since its onset. He characterizes the problem as moderate. The mood includes feelings of sadness. His change in mood was precipitated by the death of a loved one.  The degree of incapacity that he is experiencing as a consequence of his illness is moderate. Additional symptoms of the illness do not include no insomnia, no hypersomnia, no euphoric mood or no visual change. He admits to suicidal ideas. He does have a plan to commit suicide. He contemplates harming himself. He has not already injured self. He does not contemplate injuring another person. He has not already  injured another person. Risk factors that are present for mental illness include a history of mental illness and substance abuse.  Using suboxone that he is buying off the street.    Past Medical History  Diagnosis Date  . Seizure   . Protein C deficiency   . Protein C deficiency   . Suicidal ideation     Past Surgical History  Procedure Date  . Insertion of vena cava filter     History reviewed. No pertinent family history.  History  Substance Use Topics  . Smoking status: Current Every Day Smoker -- 0.2 packs/day for 5 years    Types: Cigarettes  . Smokeless tobacco: Never Used  . Alcohol Use: 3.6 oz/week    6 Cans of beer per week      Review of  Systems  Psychiatric/Behavioral: Positive for suicidal ideas and dysphoric mood. Negative for hallucinations and self-injury. The patient does not have insomnia.   All other systems reviewed and are negative.    Allergies  Fish allergy; Depakote; and Morphine and related  Home Medications   Current Outpatient Rx  Name  Route  Sig  Dispense  Refill  . DSS 100 MG PO CAPS   Oral   Take 100 mg by mouth 2 (two) times daily.   6 each   0   . FOLIC ACID 1 MG PO TABS   Oral   Take 1 tablet (1 mg total) by mouth daily.   3 tablet   0   . LACOSAMIDE 150 MG PO TABS   Oral   Take 2 tablets (300 mg total) by mouth 2 (two) times daily.   6 tablet   0   . LEVETIRACETAM 1000 MG PO TABS   Oral   Take 2 tablets (2,000 mg total) by mouth every 12 (twelve) hours.   6 tablet   0   . LORAZEPAM 0.5 MG PO TABS   Oral   Take 1 tablet (0.5 mg total) by mouth 2 (two) times daily.   6 tablet   0   . PHENOBARBITAL 64.8 MG PO TABS   Oral   Take 1 tablet (64.8 mg total) by mouth at bedtime.   3 tablet   0   . PHENYTOIN SODIUM EXTENDED 100 MG PO  CAPS   Oral   Take 200 mg by mouth 2 (two) times daily.         Marland Kitchen RISPERIDONE 0.5 MG PO TABS   Oral   Take 1 tablet (0.5 mg total) by mouth 2 (two) times daily.   60 tablet   0   . RIVAROXABAN 20 MG PO TABS   Oral   Take 1 tablet (20 mg total) by mouth every evening.   3 tablet   0   . SERTRALINE HCL 100 MG PO TABS   Oral   Take 1 tablet (100 mg total) by mouth daily.   3 tablet   0   . THIAMINE HCL 100 MG PO TABS   Oral   Take 1 tablet (100 mg total) by mouth daily.   3 tablet   0   . TOPIRAMATE 100 MG PO TABS   Oral   Take 1 tablet (100 mg total) by mouth 2 (two) times daily.   6 tablet   0     BP 132/81  Pulse 72  Temp 98.4 F (36.9 C) (Oral)  Resp 15  Ht 5\' 8"  (1.727 m)  Wt 155 lb (70.308 kg)  BMI 23.57 kg/m2  SpO2 100%  Physical Exam  Constitutional: He is oriented to person, place, and time. He appears  well-developed and well-nourished. No distress.  HENT:  Head: Normocephalic and atraumatic.  Mouth/Throat: Oropharynx is clear and moist.  Eyes: Conjunctivae normal are normal. Pupils are equal, round, and reactive to light.  Neck: Normal range of motion. Neck supple.  Cardiovascular: Normal rate, regular rhythm and intact distal pulses.   Pulmonary/Chest: Effort normal and breath sounds normal. He has no wheezes. He has no rales.  Abdominal: Soft. Bowel sounds are normal. There is no tenderness. There is no rebound and no guarding.  Musculoskeletal: Normal range of motion.  Neurological: He is alert and oriented to person, place, and time.  Skin: Skin is warm.  Psychiatric: He exhibits a depressed mood.    ED Course  Procedures (including critical care time)   Labs Reviewed  CBC WITH DIFFERENTIAL  COMPREHENSIVE METABOLIC PANEL  ETHANOL  URINE RAPID DRUG SCREEN (HOSP PERFORMED)   No results found.   No diagnosis found.    MDM  Idalia Needle from [facility] informed of need to see patient        Kano Heckmann K Raphaela Cannaday-Rasch, MD 05/17/12 4098

## 2012-05-16 NOTE — ED Notes (Signed)
Pt c/o SI for > 1 month. Pt recently treated in long term facility and released x 4 days ago. Pt states SI x 2 days. Pt has made no attempt at self-harm or suicide per pt report. Pt has been taking Suboxen that he has purchased on the street.

## 2012-05-17 LAB — CBC WITH DIFFERENTIAL/PLATELET
Basophils Absolute: 0 10*3/uL (ref 0.0–0.1)
Eosinophils Absolute: 0.2 10*3/uL (ref 0.0–0.7)
Eosinophils Relative: 2 % (ref 0–5)
HCT: 36.2 % — ABNORMAL LOW (ref 39.0–52.0)
Lymphs Abs: 1.6 10*3/uL (ref 0.7–4.0)
MCH: 26.9 pg (ref 26.0–34.0)
MCV: 81.7 fL (ref 78.0–100.0)
Monocytes Absolute: 1.2 10*3/uL — ABNORMAL HIGH (ref 0.1–1.0)
Platelets: 235 10*3/uL (ref 150–400)
RDW: 17.1 % — ABNORMAL HIGH (ref 11.5–15.5)

## 2012-05-17 LAB — COMPREHENSIVE METABOLIC PANEL
ALT: 10 U/L (ref 0–53)
Calcium: 9 mg/dL (ref 8.4–10.5)
Creatinine, Ser: 0.92 mg/dL (ref 0.50–1.35)
GFR calc Af Amer: >90 mL/min (ref >90)
Glucose, Bld: 99 mg/dL (ref 70–99)
Sodium: 141 mEq/L (ref 135–145)
Total Protein: 7 g/dL (ref 6.0–8.3)

## 2012-05-17 LAB — RAPID URINE DRUG SCREEN, HOSP PERFORMED
Barbiturates: POSITIVE — AB
Benzodiazepines: NOT DETECTED
Cocaine: NOT DETECTED
Opiates: NOT DETECTED

## 2012-05-17 MED ORDER — RIVAROXABAN 20 MG PO TABS
20.0000 mg | ORAL_TABLET | Freq: Every evening | ORAL | Status: DC
  Administered 2012-05-17 – 2012-05-18 (×2): 20 mg via ORAL
  Filled 2012-05-17 (×3): qty 1

## 2012-05-17 MED ORDER — SERTRALINE HCL 50 MG PO TABS
100.0000 mg | ORAL_TABLET | Freq: Every day | ORAL | Status: DC
  Administered 2012-05-17 – 2012-05-18 (×2): 100 mg via ORAL
  Filled 2012-05-17: qty 2
  Filled 2012-05-17 (×2): qty 1

## 2012-05-17 MED ORDER — LEVETIRACETAM 500 MG PO TABS
2000.0000 mg | ORAL_TABLET | Freq: Two times a day (BID) | ORAL | Status: DC
  Administered 2012-05-17 – 2012-05-18 (×3): 2000 mg via ORAL
  Filled 2012-05-17 (×6): qty 4

## 2012-05-17 MED ORDER — LACOSAMIDE 50 MG PO TABS
300.0000 mg | ORAL_TABLET | Freq: Two times a day (BID) | ORAL | Status: DC
  Administered 2012-05-17 – 2012-05-18 (×3): 300 mg via ORAL
  Filled 2012-05-17: qty 6
  Filled 2012-05-17: qty 5
  Filled 2012-05-17: qty 6
  Filled 2012-05-17: qty 1

## 2012-05-17 MED ORDER — RISPERIDONE 0.5 MG PO TABS
0.5000 mg | ORAL_TABLET | Freq: Two times a day (BID) | ORAL | Status: DC
  Administered 2012-05-17 – 2012-05-18 (×3): 0.5 mg via ORAL
  Filled 2012-05-17 (×3): qty 1

## 2012-05-17 MED ORDER — VITAMIN B-1 100 MG PO TABS
100.0000 mg | ORAL_TABLET | Freq: Every day | ORAL | Status: DC
  Administered 2012-05-17 – 2012-05-18 (×2): 100 mg via ORAL
  Filled 2012-05-17 (×2): qty 1

## 2012-05-17 MED ORDER — FOLIC ACID 1 MG PO TABS
1.0000 mg | ORAL_TABLET | Freq: Every day | ORAL | Status: DC
  Administered 2012-05-17 – 2012-05-18 (×2): 1 mg via ORAL
  Filled 2012-05-17 (×2): qty 1

## 2012-05-17 MED ORDER — PHENYTOIN SODIUM EXTENDED 100 MG PO CAPS: 200.0000 mg | ORAL_CAPSULE | Freq: Two times a day (BID) | ORAL | Status: DC

## 2012-05-17 NOTE — Progress Notes (Signed)
Pt was going to be discharged as recommended by Tele Psych.  Dr. Lars Mage went to meet with pt and he reported to her "so are you going to just send me home so I can kill myself?"  MD contact [facility] and work will be done to place pt in [facility].  [facility] will review when an appropriate 500 hall bed comes available.    Pt may be re-evaluated by Dr. Shela Commons in the AM but as of now, pt is waiting for placement due to SI and plan.

## 2012-05-17 NOTE — [facility] Counselor (Signed)
Trisha Mangle telepsych recommends discharge. Writer, RN and EDP in agreement. Pt to be d/c.

## 2012-05-17 NOTE — ED Notes (Signed)
Asleep, resting quietly. 

## 2012-05-17 NOTE — ED Notes (Signed)
D: Patient with dull, flat affect endorsing depression. Pt states he has SI with plan to od. A: Monitor patient Q 15 minutes for safety. R: No needs at this time.

## 2012-05-17 NOTE — ED Notes (Signed)
sleeping

## 2012-05-17 NOTE — ED Notes (Signed)
Up to the bathroom 

## 2012-05-17 NOTE — ED Notes (Addendum)
Dr. Lynelle Doctor requests [facility] re-eval pt, will notify

## 2012-05-17 NOTE — ED Notes (Signed)
Tele-psych consult completed.   

## 2012-05-17 NOTE — ED Notes (Addendum)
Asleep, resting quietly. 

## 2012-05-17 NOTE — ED Notes (Signed)
D: Patient asleep; no s/s of distress noted. A: Q 15 minute safety checks maintained. R: No change in patient's status. 

## 2012-05-17 NOTE — ED Notes (Signed)
Resting quietly. No concerns voiced. 

## 2012-05-17 NOTE — BH Assessment (Signed)
Assessment Note   [name] [name] is an 32 y.o. male who presents voluntarily to [facility]. Pt was inpatient at [facility] from 04/20/12 to 05/13/12. Pt endorses SI but denies intent. Pt has thoughts of taking an overdose of meds. [name] mother committed suicide by overdose in 09/17/1999. Pt endorses sadness, fatigue, guilt, loss of pleasure and self pity. Pt says his 60 yr old daughter and the child's mother were killed in a MVC in summer 2013. [name] affect is depressed. Pt denies substance abuse although pt's chart indicates past SA. [name] disposition pending telepsych.   Axis I: Major Depressive Disorder, Single Episode Axis II: Deferred Axis III:  Past Medical History  Diagnosis Date  . Seizure   . Protein C deficiency   . Protein C deficiency   . Suicidal ideation    Axis IV: other psychosocial or environmental problems, problems related to social environment and problems with primary support group Axis V: 31-40 impairment in reality testing  Past Medical History:  Past Medical History  Diagnosis Date  . Seizure   . Protein C deficiency   . Protein C deficiency   . Suicidal ideation     Past Surgical History  Procedure Date  . Insertion of vena cava filter     Family History: History reviewed. No pertinent family history.  Social History:  reports that he has been smoking Cigarettes.  He has a 1.25 pack-year smoking history. He has never used smokeless tobacco. He reports that he drinks about 3.6 ounces of alcohol per week. He reports that he uses illicit drugs (Marijuana).  Additional Social History:  Alcohol / Drug Use Pain Medications: see PTA meds list Prescriptions: see PTA meds list Over the Counter: see PTA meds list History of alcohol / drug use?: No history of alcohol / drug abuse Longest period of sobriety (when/how long): pt denies substance use but pt's chart indicates SA in past - currently BAL<11  CIWA: CIWA-Ar BP: 132/81 mmHg Pulse Rate: 72  COWS:    Allergies:    Allergies  Allergen Reactions  . Fish Allergy Anaphylaxis    Only fish  . Depakote (Divalproex Sodium) Swelling    Increased ammonia levels  . Morphine And Related Hives    Pt tolerated IV morphine 03/25/2012 admission. Pt tolerated PO morphine 03/29/2012.    Home Medications:  (Not in a hospital admission)  OB/GYN Status:  No LMP for male patient.  General Assessment Data Location of Assessment: [facility] ED Living Arrangements: Non-relatives/Friends Can pt return to current living arrangement?: Yes Admission Status: Voluntary Is patient capable of signing voluntary admission?: Yes Transfer from: Acute Hospital Referral Source: Self/Family/Friend  Education Status Is patient currently in school?: No Current Grade: na Highest grade of school patient has completed: 12th grade and one year of college  Risk to self Suicidal Ideation: Yes-Currently Present Suicidal Intent: No Is patient at risk for suicide?: Yes Suicidal Plan?: No Specify Current Suicidal Plan: thought to take pills, meds available Access to Means: Yes Specify Access to Suicidal Means: meds What has been your use of drugs/alcohol within the last 12 months?: pt denies Previous Attempts/Gestures: No How many times?: 0  Other Self Harm Risks: none Triggers for Past Attempts:  (na) Intentional Self Injurious Behavior: None Family Suicide History: Yes (mom died of suicide OD 1999/09/17) Recent stressful life event(s): Other (Comment) (wife and child died in auto accident summer 2013) Persecutory voices/beliefs?: No Depression: Yes Depression Symptoms: Despondent;Fatigue;Guilt;Loss of interest in usual pleasures;Feeling worthless/self pity Substance abuse  history and/or treatment for substance abuse?: Yes Suicide prevention information given to non-admitted patients: Not applicable  Risk to Others Homicidal Ideation: No Thoughts of Harm to Others: No Current Homicidal Intent: No Current Homicidal Plan: No Access to  Homicidal Means: No Identified Victim: none History of harm to others?: No Assessment of Violence: None Noted Violent Behavior Description: pt calm Does patient have access to weapons?: No Criminal Charges Pending?: No Does patient have a court date: No  Psychosis Hallucinations: None noted Delusions: None noted  Mental Status Report Appear/Hygiene: Other (Comment) (unremarkable) Eye Contact: Fair Motor Activity: Freedom of movement Speech: Logical/coherent Level of Consciousness: Quiet/awake Mood: Depressed;Anhedonia;Sad Affect: Depressed;Sad Anxiety Level: None Thought Processes: Coherent;Relevant Judgement: Unimpaired Orientation: Person;Place;Time;Situation Obsessive Compulsive Thoughts/Behaviors: None  Cognitive Functioning Concentration: Normal Memory: Remote Intact;Recent Intact IQ: Average Insight: Poor Impulse Control: Fair Appetite: Fair Weight Loss: 0  Weight Gain: 0  Sleep: No Change Total Hours of Sleep: 8  Vegetative Symptoms: None  ADLScreening [facility] Assessment Services) Patient's cognitive ability adequate to safely complete daily activities?: Yes Patient able to express need for assistance with ADLs?: Yes Independently performs ADLs?: Yes (appropriate for developmental age)  Abuse/Neglect [facility]) Physical Abuse: Denies Verbal Abuse: Denies Sexual Abuse: Denies  Prior Inpatient Therapy Prior Inpatient Therapy: Yes [facility] Prior Therapy Facilty/Provider(s): [facility] Reason for Treatment: depression, SI  Prior Outpatient Therapy Prior Outpatient Therapy: No Prior Therapy Dates: na Prior Therapy Facilty/Provider(s): na Reason for Treatment: na  ADL Screening (condition at time of admission) Patient's cognitive ability adequate to safely complete daily activities?: Yes Patient able to express need for assistance with ADLs?: Yes Independently performs ADLs?: Yes (appropriate for developmental  age) Weakness of Legs: None Weakness of Arms/Hands: None  Home Assistive Devices/Equipment Home Assistive Devices/Equipment: None    Abuse/Neglect Assessment (Assessment to be complete while patient is alone) Physical Abuse: Denies Verbal Abuse: Denies Sexual Abuse: Denies Exploitation of patient/patient's resources: Denies Self-Neglect: Denies Values / Beliefs Cultural Requests During Hospitalization: None Spiritual Requests During Hospitalization: None   Advance Directives (For Healthcare) Advance Directive: Patient does not have advance directive;Patient would not like information    Additional Information 1:1 In Past 12 Months?: Yes CIRT Risk: No Elopement Risk: No Does patient have medical clearance?: Yes     Disposition:  Disposition Disposition of Patient:  (pending telepsych)  On Site Evaluation by:   Reviewed with Physician:     Donnamarie Rossetti P 05/17/2012 3:36 AM

## 2012-05-17 NOTE — ED Notes (Signed)
Very flat, sad. Has not eaten at all today. Given OJ x2 and did drink 240cc. Also drank about 240cc from lunch tray.

## 2012-05-17 NOTE — ED Provider Notes (Signed)
Patient was discharged from behavior health after being admitted for 23 days on January 8. He states he was mildly better when he was discharged. He now presents the ER stating he is very suicidal and depressed. Patient had tele-psych consult done this morning at 3:30 who recommended patient be discharged. However when I go in the room patient states he still suicidal and feels like he is going to kill himself. I will have acting reassess patient this morning. Patient has flat affect  Devoria Albe, MD, Armando Gang   Ward Givens, MD 05/17/12 [phone]

## 2012-05-18 DIAGNOSIS — F609 Personality disorder, unspecified: Secondary | ICD-10-CM

## 2012-05-18 DIAGNOSIS — Z765 Malingerer [conscious simulation]: Secondary | ICD-10-CM

## 2012-05-18 NOTE — [facility] Counselor (Signed)
Per evaluation by psychiatrist Dr. Elsie Saas, "patient will be referred to the outpatient psychiatric services and patient does not meet criteria for inpatient hospitalization as he was not cooperate with the recent inpatient hospital treatment and also substance abuse outpatient treatment and disruptive to the therapeutic milieu with the drug seeking behavior".   Based on the recommendation to discharged patient outpatient referrals were given. Patient given outpatient referrals to [facility], [facility], [facility], [facility], etc.

## 2012-05-18 NOTE — Discharge Summary (Signed)
Reviewed. As noted by NP on this summary, patient was discharged after he requested to be discharged. He denied SI/HI/VH/AH at the time of discharge. Patient was discharged to police officers since his probation officer informed of the charges he was facing.

## 2012-05-18 NOTE — ED Provider Notes (Signed)
Psychiatry consult appreciated. Patient will be discharged.  Dione Booze, MD 05/18/12 1730

## 2012-05-18 NOTE — ED Provider Notes (Signed)
No new problems or issues overnight.  The patient tells me he remains depressed, but to a lesser degree.  Awaiting placement.  Geoffery Lyons, MD 05/18/12 [phone]
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
## 2012-05-18 NOTE — [facility] Suicide Risk Assessment (Signed)
Suicide Risk Assessment  Discharge Assessment     Demographic Factors:  Male, Adolescent or young adult, Caucasian, Low socioeconomic status and Living alone  Mental Status Per Nursing Assessment::   On Admission:     Current Mental Status by Physician: NA  Loss Factors: NA  Historical Factors: Impulsivity  Risk Reduction Factors:   Religious beliefs about death, Living with another person, especially a relative, Positive social support and Positive therapeutic relationship  Continued Clinical Symptoms:  Severe Anxiety and/or Agitation Alcohol/Substance Abuse/Dependencies Epilepsy Chronic Pain More than one psychiatric diagnosis Medical Diagnoses and Treatments/Surgeries  Cognitive Features That Contribute To Risk:  Polarized thinking    Suicide Risk:  Minimal: No identifiable suicidal ideation.  Patients presenting with no risk factors but with morbid ruminations; may be classified as minimal risk based on the severity of the depressive symptoms  Discharge Diagnoses:   AXIS I:  Malingering AXIS II:  Cluster B Traits AXIS III:   Past Medical History  Diagnosis Date  . Seizure   . Protein C deficiency   . Protein C deficiency   . Suicidal ideation    AXIS IV:  problems related to legal system/crime, problems related to social environment and problems with access to health care services AXIS V:  51-60 moderate symptoms  Plan Of Care/Follow-up recommendations:  Activity:  As tolerated Diet:  Regular  Is patient on multiple antipsychotic therapies at discharge:  No   Has Patient had three or more failed trials of antipsychotic monotherapy by history:  No  Recommended Plan for Multiple Antipsychotic Therapies: Not applicable  Britt Theard,JANARDHAHA R. 05/18/2012, 5:17 PM

## 2012-06-08 NOTE — Discharge Summary (Signed)
Reviewed

## 2013-01-23 IMAGING — US IR FLUORO RM 0-60 MIN
1 series · 1 of 1 positions shown · non-contrast
Comparison: none

CLINICAL DATA: Need for IV access.  History of multiple central
lines and currently with right upper extremity brachial vein DVT.

[Series 1: ir fluoro rm 0-60 min · 1 of 1 slices shown]
[im 1/1]
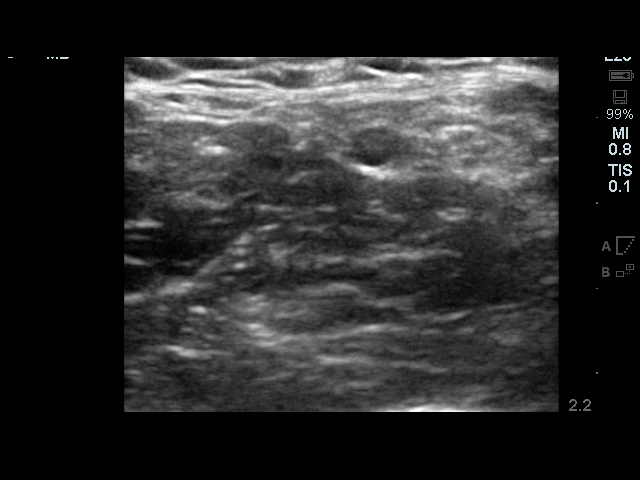

[1 of 1 positions shown; findings below may reference images not displayed]

IR FLOURO RM 0-60 MIN

The patient was brought down to establish venous access.
Initially, ultrasound was performed of both right and left neck.
The right neck was prepped with chlorhexidine.  Local anesthesia
was provided with 1% lidocaine.  Under ultrasound guidance, attempt
was made to cannulate a right neck collateral vein.  A guidewire
was advanced through the needle.

The left upper arm was then prepped with chlorhexidine.  Ultrasound
guided needle access was attempted of the left brachial vein above
the elbow.
FINDINGS: Both native internal jugular veins are chronically
occluded.  Collateral veins are present.  Despite being able to
puncture a collateral vein in the right neck, a guide wire would
not advance sufficiently to allow central venous catheter
placement.

A patent brachial vein was visualized in the left upper arm.
Initial needle passage did irritate the brachial nerve, resulting
in radicular pain extending into the hand.  When the vein was
reattempted to be accessed from a slightly different approach, the
patient refused further attempt at venous access and the procedure
was aborted.
IMPRESSION: Inability to establish central or peripheral venous access as
above.

## 2013-02-04 IMAGING — CR DG MANDIBLE 4+V
6 series · 6 of 6 positions shown · non-contrast
Comparison: None.

CLINICAL DATA: Patient fell hitting right lower jaw

MANDIBLE - 4+ VIEW

[[person_name] (1 of 3)]
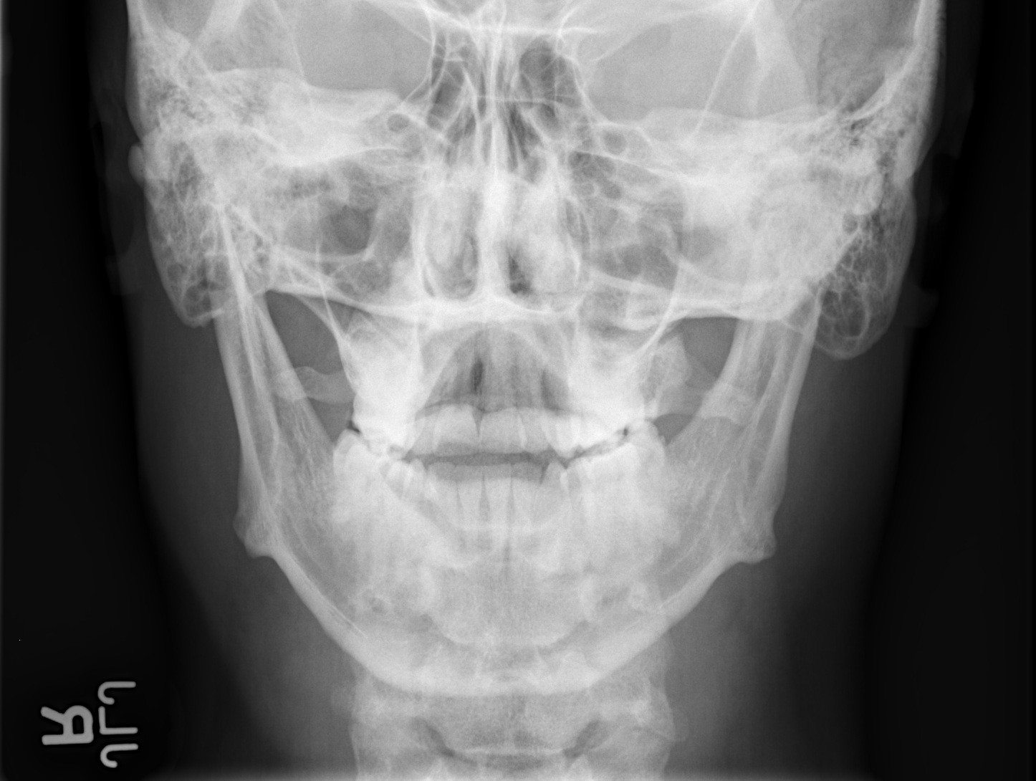

[[person_name] (2 of 3)]
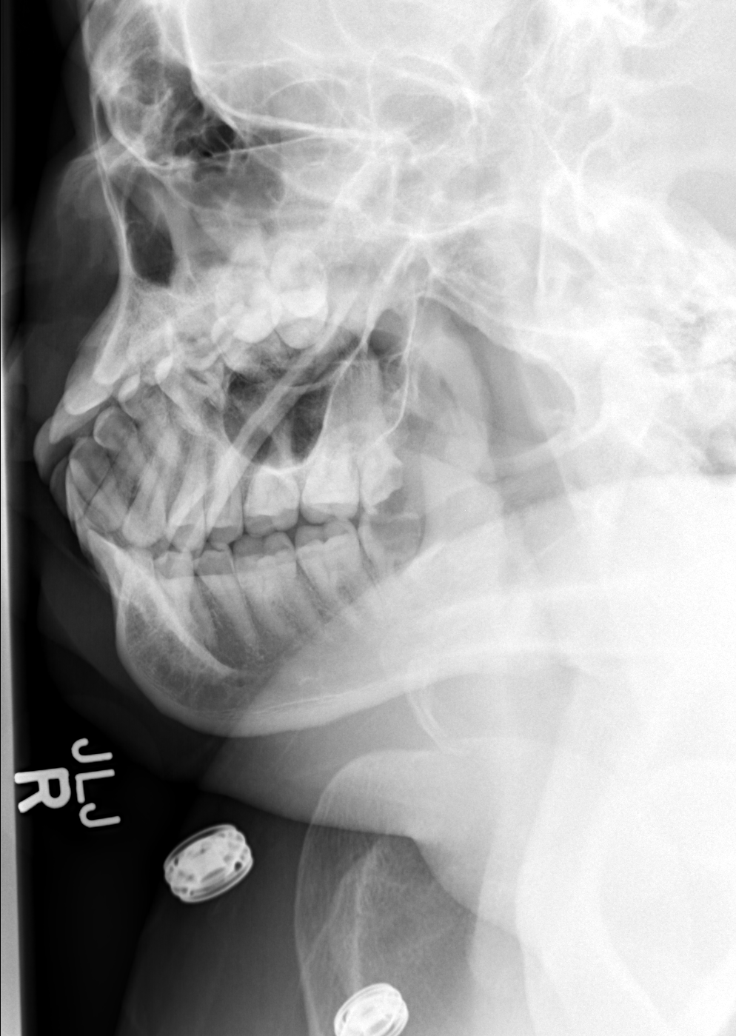

[w skull a.p./p.a.]
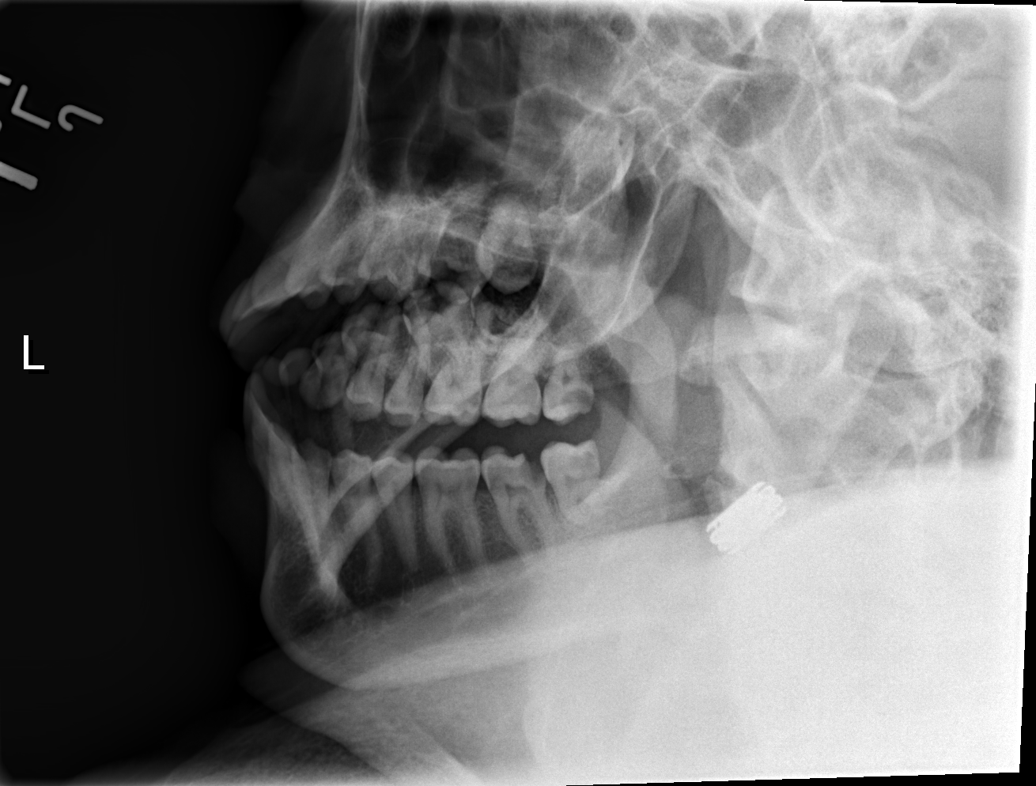

[w skull lat (1 of 2)]
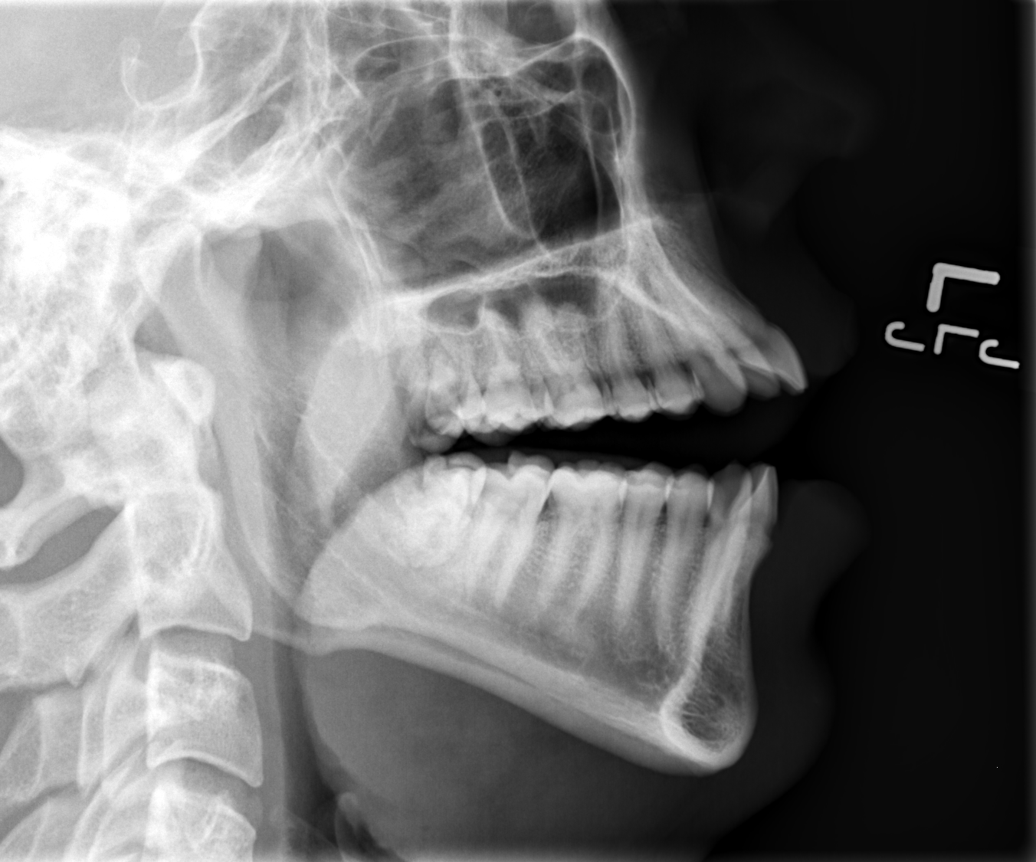

[w skull lat (2 of 2)]
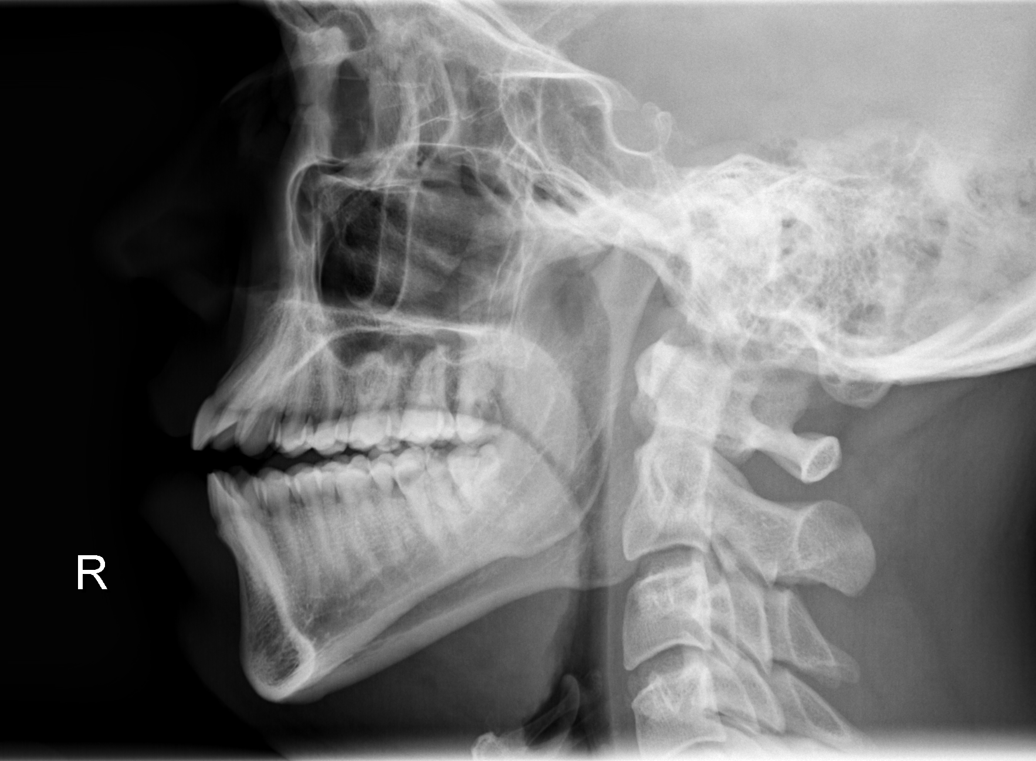

[[person_name] (3 of 3)]
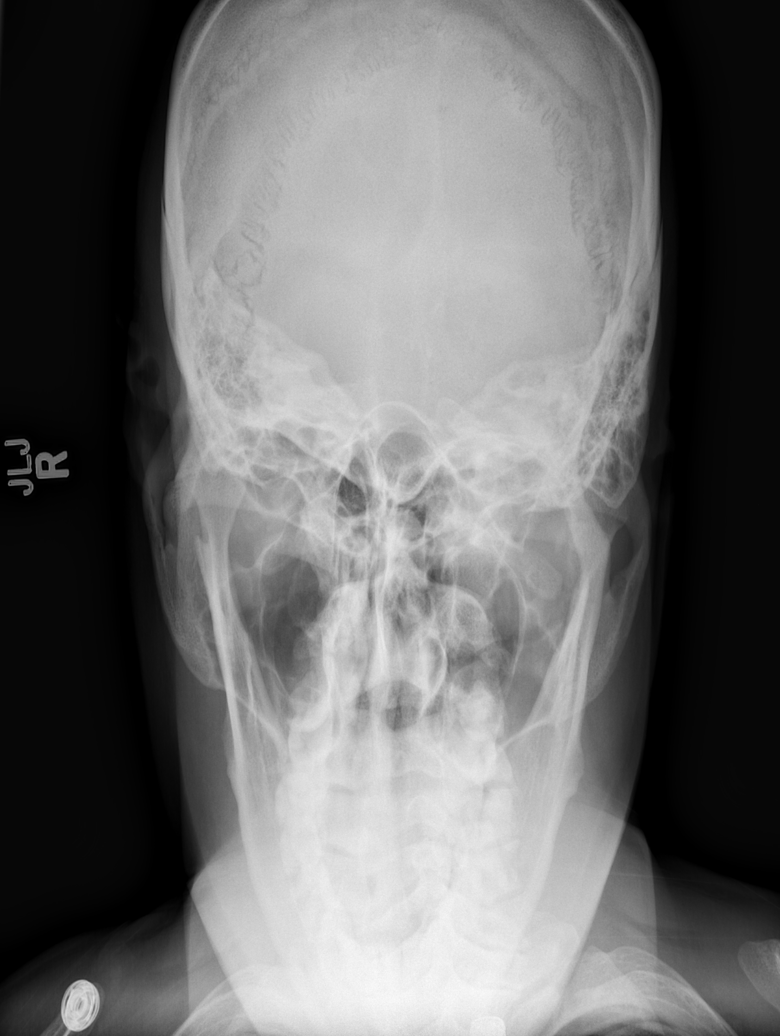

[6 of 6 positions shown; findings below may reference images not displayed]

FINDINGS: By plain film, no mandibular fracture is seen.  The
mandibular condyles appear to be in normal position.  There are
lucencies within several teeth particularly the  molars consistent
with dental caries.  The sinuses are clear.  The zygomatic arches
are intact.
IMPRESSION: No maxillofacial fracture is seen by plain film.  Probable dental
caries.

## 2014-08-23 NOTE — H&P (Signed)
PATIENT NAME:  [name] [name], ISADORE MR#:  [id number] DATE OF BIRTH:  [birth year]  DATE OF ADMISSION:  02/14/2012  IDENTIFYING INFORMATION AND CHIEF COMPLAINT: This is a 34 year old man transferred under involuntary commitment from an outside hospital because of suicidal ideation.   HISTORY OF PRESENT ILLNESS: Information obtained from the patient and from the chart. The patient states that his biggest problems are his depression and his opiate dependence. As far as his depression, he has been feeling depressed and sad since this summer when his young daughter died, but it has been getting progressively worse. He feels sad and down almost all of the time. He has lost interest in most of his normal activities. He no longer finds that he enjoys anything. He is not able to go to work and concentrate. He has had suicidal thoughts with thoughts of overdosing on medication. He feels tired much of the time. Sleep is poor. All of this is getting worse. He is not getting any kind of treatment for it. The patient also has a long-standing problem with opiate dependence starting at a teenager. He currently uses Opana and takes about 5 to 8 of the 40 mg doses a day which he usually uses by snorting. He has been doing this steadily for years. He says that he has tried to quit on his own, but the detoxed has always been so unpleasant that he gave up. He has never been in a hospital for it. He has not had any narcotics now for about 24 hours and is feeling a significant amount of pain and rhinorrhea. The patient's stress comes from his substance abuse and also from the death of his young daughter in a car accident in July of this year.   PAST PSYCHIATRIC HISTORY: He says that he has been in a psychiatric hospital only one time and that was here at [facility] when he was a teenager when his mother committed suicide. We do not have records currently available of that. He says that at the time he was treated with Paxil  but does not remember the effect of it. He has not been on any medicine since then. He has not made any actual suicide attempts yet. Does not report any psychotic symptoms. Does not report any manic symptoms. He has never had any kind of substance abuse treatment.   SUBSTANCE ABUSE HISTORY: Abusing opiates ever since he was a teenager. Never been able to maintain a steady time off of the medicine. Denies drinking alcohol. Says that he has used cocaine and marijuana very infrequently and not for a long time. He primarily uses narcotics.   PAST MEDICAL HISTORY: The patient has epilepsy and has had grand mal seizures since he was a child. He is maintained on Dilantin 200 mg twice a day. He says that when he takes his Dilantin he does not have seizures, but he had been off it for several days recently resulting in a seizure that brought him to the Emergency Room of the hospital that referred him.   FAMILY HISTORY: Mother had bipolar disorder and committed suicide.   SOCIAL HISTORY: The patient is not married. He lives alone. He is not in any romantic relationship. He suffered the loss of an infant daughter this summer. His mother died over 10 years ago. His stepfather is deceased. He is estranged from his biological father. His closest relative is his sister. He has a significant social life, but a lot of people he is with also use  substances.   CURRENT MEDICATIONS: Dilantin 200 mg twice a day.   ALLERGIES: Depakote and Toradol.   REVIEW OF SYSTEMS: Complains of feeling achy, nausea, having runny nose. He is depressed. Has passive suicidal thoughts. Denies hallucinations. Denies other psychotic symptoms.   MENTAL STATUS EXAM: Casually dressed, neatly groomed man who looks his stated age. Cooperative and pleasant in the interview. Good eye contact. Normal psychomotor activity. Speech is normal in tone, but decreased in total amount. Affect is somewhat flattened. Mood is stated as depressed. Thoughts are  lucid with no sign of loosening of associations or delusional thinking. Denies hallucinations. Denies any homicidal ideation. Has suicidal thoughts without any active intent or plan currently. Short and long-term memory grossly intact. Intelligence normal. Alert and oriented x4. Pretty good judgment and insight.   PHYSICAL EXAMINATION:  GENERAL: The patient weighs 153 pounds. He does not look to be in severe acute distress. He has old acne scars on his face but no acute lesions. Some raised gooseflesh on part of his arms.   HEENT: Pupils are equal and reactive. Face is symmetric. Oral mucosa dry.   NECK AND BACK: Nontender.   MUSCULOSKELETAL: Full range of motion at all extremities. Normal gait.   NEUROLOGICAL: His cranial nerves are intact and symmetric and his strength and reflexes are symmetric and normal upper and lower.   LUNGS: Clear with no wheezes.   HEART: Regular rate and rhythm.   ABDOMEN: Soft, nontender, normal bowel sounds.   VITAL SIGNS: Temperature 98.1, pulse 96, respirations 18, blood pressure 141/79.   LABORATORY DATA: We do not have any labs drawn yet because he was a transfer from an outside hospital.   ASSESSMENT: A 34 year old man with major depression and opiate dependence. Complicated by a history of epilepsy. Strengths include having a job and support from work. Being insightful and feeling motivated. The patient is admitted because of serious suicidal ideations, serious depression, poor functioning outside the hospital as well as the complication of his opiate dependence.   TREATMENT PLAN:  1. Support and education done.  2. Review current history and mental status.  3. As far as medication for the opiate withdrawal, I will add clonidine patch, ondansetron p.r.n., Lomotil p.r.n., Robaxin p.r.n.  4. Vital signs will be checked regularly.  5. I will continue his Dilantin at the usual dose. We will check a Dilantin level in a couple of days.  6. I would suggest  that we start him on an antidepressant, specifically I suggested Prozac to start tomorrow morning 20 mg a day. The patient was agreeable to the plan.   DIAGNOSIS PRINCIPLE AND PRIMARY:  AXIS I: Major depressive episode, single, severe.   SECONDARY DIAGNOSES:  AXIS I: Opiate dependence.   AXIS II: Deferred.   AXIS III:  1. Epilepsy. 2. Opiate withdrawal.   AXIS IV: Severe, acute and chronic stresses from isolation, loneliness, death of a child.   AXIS V: Functioning at time of evaluation 32.    ____________________________ Audery AmelJohn T. Lejend Dalby, MD jtc:ap D: 02/14/2012 18:17:07 ET T: 02/15/2012 07:42:28 ET JOB#: [id number]  cc: Audery AmelJohn T. Dvid Pendry, MD, <Dictator> Audery AmelJOHN T Sabree Nuon MD ELECTRONICALLY SIGNED 02/17/2012 10:14

## 2014-08-23 NOTE — Discharge Summary (Signed)
PATIENT NAME:  [name] [name], [name] [name] MR#:  [id number] DATE OF BIRTH:  [birth year]  DATE OF ADMISSION:  02/17/2012 DATE OF DISCHARGE:  02/21/2012  HOSPITAL COURSE: This is 34 year old man was admitted to the hospital on 02/14/2012 in transfer from [facility]. He was complaining of severe depression with suicidal thoughts. He was also complaining of symptoms of opiate withdrawal having recently stopped using high doses of oral narcotics. The patient was admitted to the hospital for opiate withdrawal but also primarily for his severe depression. He was placed on medications for management of opiate withdrawal including clonidine patch, Robaxin, Zofran, and Lomotil. It was also suggested to him that for his depression we go ahead and start Prozac which he was agreeable to. The patient was not presenting with psychotic symptoms and was not threatening suicide in the hospital, although he was having suicidal ideation and multiple symptoms of depression.  Hospital course was interrupted when on 02/15/2012, in the afternoon, the patient had what was thought to be a grand mal seizure. [facility] was called and the patient was transported to the Critical Care Unit. Treatment of the seizure was complicated in the Critical Care Unit because the patient's protein C deficiency makes it extraordinarily hard for IV access to be obtained. Eventually the patient did stop having his seizure. Dilantin was given. Labs were eventually obtained. The patient was managed overnight in the Critical Care Unit and did not have any further seizures. The next day he was transferred back to psychiatry and has remained seizure-free for the rest of his hospital course. He was able to stop all medicines for opiate withdrawal after just a couple of days. He now reports that he is feeling physically much more stable and well. He has tolerated the Prozac well with no complications or side effects. He reports as of today that his mood is  feeling much better and he is feeling more optimistic. He requested that he have longer term treatment for substance abuse which seems appropriate. [facility] in [place] has been reserved and the patient will be transferred tomorrow. The [facility] in [place] has specialized substance abuse treatment specialists and a specialized program for substance abuse that is not available at our facility, also has longer term treatment in place which will increase his chances of remaining sober. The patient is in agreement to the discharge plan. He will be discharged on his current Dilantin and Prozac. He is under involuntary commitment pending and will be transferred by sheriff.   DISCHARGE MEDICATIONS:  1. Dilantin 200 mg twice a day.  2. Prozac 20 mg per day.   LABORATORY DATA: Labs were obtained finally on 02/15/2012 after he had his seizure. Dilantin level was 14.4, obtained at 10:08 p.m. I am not sure how much Dilantin he had been given at the time that that blood test was finally done. The CBC showed a low MCV at 79, hematocrit low at 25.7, and hemoglobin low at 11.4. Chemistry showed a slightly high glucose at 109, of no significance, chloride 109. CK and troponin and MB all negative. Urinalysis unremarkable. A urine drug screen was positive for cocaine as well as opiates and benzodiazepines.   MENTAL STATUS EXAMINATION: Clean, neatly groomed, appropriately dressed man who looks his stated age. Good eye contact. Normal psychomotor activity. Speech is normal in rate, tone, and volume. Affect is slightly flat. Mood is stated as much better. Thoughts appear generally lucid  with no evidence of delusional thinking or loosening of associations. Denies auditory or visual hallucinations. Denies any acute suicidal or homicidal ideation. Baseline intelligence normal. Short and long-term memory grossly intact. Judgment and insight improved.    DISPOSITION: Transfer directly to [facility] [place].   DIAGNOSIS PRINCIPLE AND PRIMARY:   AXIS I: Major depression, severe, recurrent.   SECONDARY DIAGNOSES:   AXIS I:  1. Opiate dependence.  2. Cocaine abuse.  3. Benzodiazepine abuse.   AXIS II: Deferred.   AXIS III: Seizure disorder, protein C deficiency.         AXIS IV: Severe stress from the recent death of his child this summer.   AXIS V: Functioning at time of discharge 57.  ____________________________ [name] AmelJohn T. Micah Galeno, MD jtc:slb D: 02/20/2012 14:18:00 ET T: 02/20/2012 14:35:05 ET JOB#: [id number]  cc: [name] AmelJohn T. [name] Lamp, MD, <Dictator> [name] AmelJOHN T Ashani Pumphrey MD ELECTRONICALLY SIGNED 02/20/2012 15:10

## 2014-08-23 NOTE — Consult Note (Signed)
Brief Consult Note: Diagnosis: seizure.   Patient was seen by consultant.   Consult note dictated.   Comments: - I was called about this pt last night (Consult was not called on 10/12), I was not added to "consultat" on chart either.  - Seizure - "all his life" once a year, metallic taste then amnesia. Witness description of generalized seizure, +ve family history of epilepsy in Mom.  - Pt would like to continue dilantin (follow level), he preferred to get EEG and MRI [place] - OK to transfer to behavioral unit from neurological stand point.  Electronic Signatures: Jolene ProvostShah, Layci Stenglein Kalpeshkumar (MD)  (Signed 14-Oct-13 08:26)  Authored: Brief Consult Note   Last Updated: 14-Oct-13 08:26 by Jolene ProvostShah, Raffaela Ladley Kalpeshkumar (MD)

## 2014-08-23 NOTE — Discharge Summary (Signed)
PATIENT NAME:  [name] [name], [name] [name] MR#:  [id number] DATE OF BIRTH:  [birth year]  DATE OF ADMISSION:  02/15/2012 DATE OF DISCHARGE:  02/17/2012  DISCHARGE DIAGNOSES:  1. 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  2. Suicidal ideation   IMAGING STUDIES: Chest x-ray showed no acute abnormalities.   CONSULTS:  1. Dr. Toni Amendlapacs of psychiatry. 2. Dr. Maryruth BunKapur of psychiatry.  3. Dr. Anda KraftMarterre of surgery.  4. Dr. Sherryll BurgerShah of neurology.   ADMITTING HISTORY AND PHYSICAL: Please see detailed history and physical dictated by Dr. Cherlynn KaiserSainani on 02/15/2012. In brief 34 year old Caucasian male patient who was admitted to behavioral medicine unit under involuntary commitment secondary to suicidal ideation, was noted to have three episodes of tonic-clonic seizures lasting about 30 seconds. Patient did not have any postictal phase with the seizures but considering the multiple seizures was admitted to the Intensive Care Unit. Patient refused CT scan of the head. Multiple attempts to get a peripheral IV were unsuccessful after which a central line was attempted but could not be done by Dr. Anda KraftMarterre. A PICC line was suggested but patient refused a PICC line. His Dilantin from home was continued. He was seen by Dr. Sherryll BurgerShah of neurology who suggested patient can be discharged back to behavioral health unit on the Dilantin from home dose along with Keppra. Patient has not had any further seizures since being transferred to the hospitalist service and is being discharged back to behavioral health unit as suggested by neurology after being medically cleared.   Patient had a sitter in the room along with security secondary to his severe agitation and threatening behavior during the hospital stay.   Prior to discharge patient's temperature 97.7, pulse 65, blood pressure 117/66, and saturating 97% on room air.   DISCHARGE MEDICATIONS:  1. Dilantin 200 mg oral twice a day.  2. Keppra 500 mg oral 3 times a day.  3. Symbyax 25/12 mg 1 tablet oral  once a day at bedtime.  4. Fluoxetine 20 mg oral once a day.  5. Methocarbamol 500 mg oral 2 times a day as needed for muscle or chest pain.   DISCHARGE INSTRUCTIONS: Patient is being discharged to behavioral health unit. He has suicidal ideation, has an IVC. Will be on suicidal precautions under care of Dr. Toni Amendlapacs of psychiatry. He will be on regular diet with activity as tolerated.   This plan was discussed with the patient who has verbalized understanding and is okay with the plan.   TIME SPENT: Time spent today on this discharge dictation along with coordinating care and counseling of the patient was 35 minutes.  ____________________________ Molinda BailiffSrikar R. Deandrea Vanpelt, MD srs:cms D: 02/17/2012 14:21:00 ET T: 02/18/2012 09:48:22 ET JOB#: [id number]  cc: Wardell HeathSrikar R. Elpidio AnisSudini, MD, <Dictator> Audery AmelJohn T. Clapacs, MD Orie FishermanSRIKAR R Jaid Quirion MD ELECTRONICALLY SIGNED 03/02/2012 13:57

## 2014-08-23 NOTE — Consult Note (Signed)
Details:    - Psychiatry: Patient seen. He reports feeling physically better than over the weekend. Affect still blunted but denies ACTIVE suicidal plan. He does endorse ongoing thoughts of wishing to die. Has had no further seizures. They never did the PICC line but evidently medicine has given up on the urgency of this after multiple attempts.   No order change. Still depressed and in need of monitered opiate withdrawl. Will accept back to psychiatry service whenever you are ready to release him.   Electronic Signatures: Shalunda Lindh, Jackquline DenmarkJohn T (MD)  (Signed 14-Oct-13 12:39)  Authored: Details   Last Updated: 14-Oct-13 12:39 by Audery Amellapacs, Frazer Rainville T (MD)

## 2014-08-23 NOTE — H&P (Signed)
PATIENT NAME:  Livia SnellenTILLI, Yarel MR#:  [id number] DATE OF BIRTH:  1980/07/01  DATE OF ADMISSION:  02/15/2012  PRIMARY CARE PHYSICIAN: He does not have one.   CHIEF COMPLAINT: Recurrent seizures.   HISTORY OF PRESENT ILLNESS: This is a 34 year old male who was admitted to Behavioral Medicine under involuntary commitment due to suicidal ideations, was noted to have two to three episodes of tonic-clonic seizures this evening. The first one lasted about six minutes and 30 seconds later he had another one and shortly thereafter another one. A CODE BLUE was called and he was transferred urgently to the Intensive Care Unit. The patient was able to maintain his airway throughout these episodes and has been hemodynamically stable. In fact, the patient is more coherent and alert presently. He does complain of a headache, but no other associated symptoms presently. The patient does have a history of epilepsy. He is currently on Dilantin, but has very poor IV access and has not had a Dilantin level checked. Although I did look at the records from the previous hospital that he came from, his Dilantin level was noted to be 4. Due to his recurrent seizures, he was transferred over to the Intensive Care Unit onto the medical services. The patient denies any nausea, vomiting, chest pain, shortness of breath or any other associated symptoms presently.   REVIEW OF SYSTEMS: CONSTITUTIONAL: No documented fever. No weight gain, no weight loss. EYES: No blurred or double vision. ENT: No tinnitus. No postnasal drip. No redness of the oropharynx. RESPIRATORY: No cough, no wheeze, no hemoptysis. CARDIOVASCULAR: No chest pain, no orthopnea, no palpitations, no syncope. GASTROINTESTINAL: No nausea, no vomiting, no diarrhea, no abdominal pain, no melena, no hematochezia. GU: No dysuria or hematuria. Positive urinary incontinence. INTEGUMENTARY: No rashes or lesions. MUSCULOSKELETAL: No arthritis, no swelling, no gout. NEUROLOGIC: No  numbness, no ataxia. Positive seizure-type activity. PSYCH: No anxiety, no insomnia, no ADD. Positive depression.   PAST MEDICAL HISTORY:  1. Depression. 2. History of protein C deficiency. 3. History of epilepsy.   ALLERGIES: Depakote and Toradol.   SOCIAL HISTORY: Does smoke, about a pack per day. No alcohol abuse. No illicit drug abuse. Lives at home by himself.   FAMILY HISTORY: Significant for epilepsy. He does not know his father's medical history at all. His mother passed away.   CURRENT MEDICATION: Dilantin 200 mg b.i.d.   PHYSICAL EXAMINATION:  VITAL SIGNS: Heart rate 112, blood pressure 141/79, sats 99% on two liters nasal cannula.   GENERAL: He is a pleasant appearing male in mild distress.   HEENT: Atraumatic, normocephalic. Extraocular muscles are intact. Pupils equal and reactive to light. Sclerae anicteric. No conjunctival injection. No pharyngeal erythema.   NECK: Supple. No jugular venous distention, no bruits, no lymphadenopathy. No thyromegaly.   HEART: Regular rate and rhythm. Tachycardic. No murmurs, rubs, or clicks.   LUNGS: Clear to auscultation bilaterally. No rales, no rhonchi, no wheezes.   ABDOMEN: Soft, flat, nontender, nondistended. He has good bowel sounds. No hepatosplenomegaly appreciated.   EXTREMITIES: No evidence of any cyanosis, clubbing, or peripheral edema. Has +2 pedal and radial pulses bilaterally.   NEUROLOGIC: The patient is alert, awake, and oriented x3 with no focal motor or sensory deficits appreciated bilaterally.   SKIN: Moist and warm with no rashes appreciated.   LYMPHATIC: There is no cervical or axillary adenopathy.   NEUROLOGIC: He is alert, awake and oriented x3 with no focal motor or sensory deficits.  LABORATORY, RADIOLOGICAL AND DIAGNOSTIC DATA: Currently pending.  ASSESSMENT AND PLAN: This is a 34 year old male with a history of protein C deficiency, history of seizures, likely pseudoseizures, who was admitted to  Behavioral Medicine for suicidal ideations and was noted to have two to three tonic-clonic seizures lasting about 30 seconds apart. He was also noted to be incontinent of urine.  1. Seizures/recurrent seizures. The exact etiology is currently unclear. The patient was in [facility]. Therefore, was urgently transferred to the Intensive Care Unit. Worrisome for status epilepticus, although he is hemodynamically stable and is maintaining his airway. He has very poor IV access. I am getting general surgery to place a central line on him. I will go ahead and obtain a stat Dilantin level. Also, get a stat CBC, comprehensive metabolic profile, cardiac markers, urine toxicology and urinalysis. I will load him up with IV Dilantin at 1 gram and start him on fosphenytoin q.8 hours. I will place him on seizure precautions and get a neurology consult. Will also get a stat CT of his head and follow him clinically. As mentioned, he is currently maintaining his airway and hemodynamically stable.  2. History of protein C deficiency with a history of deep vein thrombosis. The patient apparently cannot tolerate anticoagulation due to some problems with bleeding. 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 I will try to obtain some records from [facility]. While he is in the hospital, I will place him on TEDs and SCDs.  3. Suicidal ideations. The patient has been involuntarily committed. I will continue with his sitter. Psychiatry will continue to follow the patient. Will continue with Prozac for now.   CODE STATUS: The patient is a FULL CODE.   TIME SPENT WITH ADMISSION: 60 minutes.  ____________________________ Rolly Pancake. Cherlynn Kaiser, MD vjs:ap D: 02/15/2012 20:48:47 ET T: 02/16/2012 09:31:59 ET JOB#: [id number]  cc: Rolly Pancake. Cherlynn Kaiser, MD, <Dictator> Houston Siren MD ELECTRONICALLY SIGNED 02/16/2012 10:50

## 2014-08-23 NOTE — Consult Note (Signed)
Details:    - Psychiatry: Patient seen tonight after having a code blue called for observed seizure. Patient was observed having tonic contractions and being transiently unresponsive. Currently he is awake and alert and responsive. He reports having aching pain all over. He does not appear delirious and can answer questions of recent history appropriately.   Work up and treatment complicated by the patient being very very hard to get IV access. So far even placement of central line has not worked. As a result he has had no labs done since admission and meds have ot been administered IV, only IM.  Plan at this point after discussioin with pt and CCU MD Cherlynn KaiserSainani is for a one time IM shot of 3mg  dilaudid for pain and then placement of a PIC line. Patient verbally consented to this plan and procedure several times.  I will also write for liberal use of im lorazepam for now for irritability and agitation to assist in controlling anxiety and helping prevent seizure.  Sitter order written for Engineer, materialssecurity officer at the suggestion of nursing. Patient has been making hostile and threatening statements and nursing do not feel secure controlling his behavior without male security present.  Once patient is medically stable to full satisfaction of medical staff [facility] and opiate withdrawl.   Electronic Signatures: Audery Amellapacs, Denea Cheaney T (MD)  (Signed 12-Oct-13 21:20)  Authored: Details   Last Updated: 12-Oct-13 21:20 by Audery Amellapacs, Harshika Mago T (MD)

## 2014-08-23 NOTE — Consult Note (Signed)
PATIENT NAME:  [name] [name], [name] [name] MR#:  [id number] DATE OF BIRTH:  1981-03-31  DATE OF CONSULTATION:  02/17/2012  REFERRING PHYSICIAN:  Dr. Wardell HeathSrikar Sudini  CONSULTING PHYSICIAN:  Doni Bacha K. Sherryll BurgerShah, MD  REASON FOR CONSULTATION: Recurrent seizure.   HISTORY OF PRESENT ILLNESS: [name] [name] is a 34 year old Caucasian gentleman who was consulted actually on 02/15/2012 but I was not called and I was not added as a Research scientist (medical)consultant on the electronic medical record system so I found out about this patient and I received a call about this patient on October 13th at night when the patient got transferred from CCU to the floor so I saw him in the early morning today.   The patient has a history of "epilepsy" since very young age. He could not tell me exactly when his seizures started. He is not aware whether he had a history of seizure or not.   During his typical seizure, he has a metallic taste in his mouth and then he forgets what happens.   He wakes up with people around him.   He is not sure how long typically his seizure lasts. Occasionally he bites his tongue and has loss of control of his bladder.   Other people have described that he has flexion of his hands.   His seizure lasts for brief seconds to minutes.   He has very infrequent seizures, once a year or so.   The patient [place], Dr. Pervis Hockingavi.   The patient mentioned he has had EEG and MRI of the brain done but does not remember exactly when they were done. He has been taking Dilantin for years and feels like he has been stable on that medication.   The patient also has medication noncompliance.   The patient was admitted to the Behavioral Unit for suicidal ideation and opioid addiction.   The patient denied any early morning jerk or birthmarks. Per his knowledge his birth was full-term. His development was okay. He had to take flu immunization.   The patient denied family history of epilepsy  except mother with seizures who has been on Dilantin and Depakote.   PAST MEDICAL HISTORY:  1. Depression. 2. Protein C deficiency. 3. History of epilepsy.   PAST SURGICAL HISTORY: Negative.   SOCIAL HISTORY: Significant that he does not smoke, does not drink alcohol, does not do recreational drugs. He lives at home by himself. Later he said he smokes around a pack a day.   FAMILY HISTORY: Significant for epilepsy in his mother. He is not aware of his father's medical history.   REVIEW OF SYSTEMS: Positive for feeling sad, otherwise, 10 system review of system was asked and was found to be unremarkable.   MEDICATIONS: I reviewed his home medication list and allergies.   PHYSICAL EXAMINATION:   VITAL SIGNS: Temperature 97.7, pulse 65, respiratory rate 18, blood pressure 98/56, pulse oximetry 97%.   GENERAL: He is a young Caucasian gentleman lying in bed attended by Emergency planning/management officerpolice officer and in-house sitter.   He just woke up. He has a beard. I did not see any neurocutaneous stigmata.   LUNGS: Clear to auscultation.   HEART: S1, S2 heart sounds.   NECK: Carotid exam did not reveal any bruit.   Funduscopic exam was attempted.   MENTAL STATUS EXAM: He was alert, oriented. He followed two-step inverted commands. His language seems to be intact. He does not have neurological neglect.   He does have a flat  affect and feels less interested in neurological exam. He does give short answers.   He denied any current suicidal ideation nor plan but does feel like he has chronic depression and feels like meaningless life.   On his cranial nerves, his pupils are equal, round, and reactive. Extraocular movements are intact. His face was symmetric. Tongue was midline. Facial sensations were intact. His visual fields were full. His hearing seems to be intact.   On his motor exam, he has normal tone and strength of 5/5.   His deep tendon reflexes were symmetric. His gait was normal.   REVIEW OF  RADIOLOGICAL DATA: His CT scan of the head was unremarkable.   ASSESSMENT AND PLAN:  Recurrent seizure. There is a concern for status epilepticus from the Behavioral Unit where he was transferred to the ICU and then back to the floor.   I am sorry that I was not able to see the patient earlier. Consult was called on 02/15/2012 but I was never informed by the clinical staff about the consult and I was not added as a consultant into the EMR so I was not aware of this patient.   I was called on October 13th at nighttime so I saw the patient early morning of October 14th.   There was a concern for nonepileptic spell from the hospitalist physician. I agree with him that monitoring is the ideal way of figuring out whether the patient has nonepileptic spells versus epileptic seizures.   I mentioned this to the patient but he is not interested in getting any type of work-up done. He would rather get this test done by his regular neurologist, Dr. Pervis Hocking, in [place], [place].   The patient is on Dilantin. He is not having any spells so I will honor this patient's wishes. I asked him to continue to take Dilantin until he sees his neurologist.   The feature that goes against a nonepileptic spell is the frequency of the spells which is almost once a year which would be more consistent with organic epilepsy syndrome as well as with positive family history of epilepsy.   I talked to the patient about potential side effects of Dilantin including bone marrow suppression, liver toxicity, drug interactions, enzyme induction, gingivitis, osteoporosis, etc.   The patient should take Calcium and Vitamin D supplementation.   The patient was also informed about seizure precaution including, but not limited to, driving restrictions. In the [place] once the patient has a seizure he cannot drive for at least six months from his last seizure.   The patient was advised that he should not be  involved in operating heavy machinery, pursuing swimming himself, or climbing tall ladders, etc.   The patient understands this.   Transferred to the Psych Unit. From neurological perspective, I think he should be able to transfer to the next level of care which is appropriate for him.   Feel free to call me with any further questions. He will follow-up with his regular neurologist as an outpatient.   ____________________________ Durene Cal. Sherryll Burger, MD hks:drc D: 02/17/2012 20:57:20 ET T: 02/18/2012 08:12:03 ET JOB#: [id number]  cc: Wai Litt K. Sherryll Burger, MD, <Dictator> Durene Cal [facility] MD ELECTRONICALLY SIGNED 02/27/2012 16:38

## 2014-08-23 NOTE — H&P (Signed)
PATIENT NAME:  [name] [name], [name] [name] MR#:  [id number] DATE OF BIRTH:  08/26/1980  DATE OF ADMISSION:  02/17/2012  IDENTIFYING INFORMATION AND CHIEF COMPLAINT: See dictated history and physical from 10/11 originally for history of current psychiatric condition. Follow-up history and physical was done on the medical service on 10/12. Patient was originally on psychiatry, transferred to medicine and is now transferred back to psychiatry. This 34 year old man being transferred to psychiatry.   CHIEF COMPLAINT: "I'm still depressed."   HISTORY OF PRESENT ILLNESS: Patient was originally admitted to psychiatry on 10/11 in transfer from an outside hospital where he was complaining of severe depression with suicidal ideation. Patient also had been dependent on high doses of narcotics and was seeking detox from his narcotic addiction. Patient was put on medications for withdrawal from opiates and was to be started on antidepressant medication. After about one day in the hospital he had what appeared to be a grand mal seizure. It was witnessed by staff and apparently went on for many minutes. A CODE BLUE was called and patient was transferred to the Intensive Care Unit. He was stabilized after coming out of his seizure and has now been seizure free for about two days and is transferred back to psychiatry because he is still depressed. Complains of severely depressed mood that has been going on for about six months but getting worse. Lack of energy, lack of interest in normal activities, suicidal ideation with active thoughts about shooting himself or overdosing on acetaminophen. He also had been using about 160 mg a day of Opana orally or snorting. He continues to complain of severe depression. Does not report any psychotic symptoms. He is continuing to report feeling achy all over from opiate withdrawal but feels like it is starting to get better.   PAST PSYCHIATRIC HISTORY: One previous admission to our facility in 2007 for  depression. During that hospital stay he also had to be transferred to medical procedures. He was ultimately discharged on Symbyax with a presumptive diagnosis of bipolar disorder with depression. The patient tells me that he did not really follow up with treatment at that time and cannot remember if the medication was helpful. He otherwise has not been getting psychiatric treatment in the intervening years. The acute stimulus he was getting depressed was that his 34-year-old daughter died in an automobile accident over the summer. He has been feeling more depressed since then.   SUBSTANCE ABUSE HISTORY: Reports for years he has been abusing Opana and other oral narcotics, usually taken nasally or orally. He does not shoot narcotics in large part because he has difficulty with venous access. He also occasionally uses other drugs but narcotics are by far his drug of choice. He has not been able to detox on his own or stay drug free since becoming addicted.   SOCIAL HISTORY: Lives alone. Works doing Arts administrator work. Had a 11-year-old daughter out of wedlock but to whom he was close who died this summer. His only living relative he stays in touch with his sister. Has multiple friends but they tend to be substance abusers.   PAST MEDICAL HISTORY: Has a history of epilepsy dating back to childhood. Usually maintained on Dilantin 400 mg total a day. On a previous hospitalization the opinion was put forth that there might be an element of pseudoseizure involved as well although I think generally it appears that he does have a seizure disorder. Patient also reports that he has a chronic problem with venous access. We  have not been able to place an IV or get adequate venous access for labs since he has been in the hospital. Even getting a subclavian line proved to be impossible. It is not quite clear to me what this might be related to.   FAMILY HISTORY: Mother had bipolar disorder and killed herself.    REVIEW OF SYSTEMS: Continues to complain of depressed mood, fatigue, lack of interest in normal activities and suicidal ideation. Symptoms are slightly better than on admission but he still has frequent intrusive thoughts of overdosing. Denies psychotic symptoms.   MENTAL STATUS EXAM: Neatly dressed and groomed young man looks his stated age. Cooperative with the interview. Good eye contact. Normal psychomotor activity. Speech is normal in rate, tone, and volume. Affect is blunted, somewhat dysphoric. Mood stated as depressed. Thoughts are lucid with no loosening of associations. Denies auditory or visual hallucinations. Denies homicidal ideation but does have suicidal thoughts without acute intent. Intelligence normal. Judgment and insight adequate. Short and long-term memory intact.   PHYSICAL EXAMINATION:  GENERAL: Patient does not appear to be in any medical distress. He is able to ambulate normally.   MUSCULOSKELETAL: Full range of motion at all extremities.   SKIN: No obvious skin lesions. No wounds from the seizure.   HEENT: He has equal and reactive pupils and a symmetric face.   NECK AND BACK: Nontender.   NEUROLOGICAL: Cranial nerves are symmetric and normal throughout as is strength and reflexes, symmetric and normal upper and lower.   LUNGS: Clear to auscultation.   HEART: Regular rate and rhythm.   ABDOMEN: Soft, nontender, normal bowel sounds.   VITAL SIGNS: Blood pressure 118/69, respirations 18, pulse 57, temperature 95.8.   CURRENT MEDICATIONS:  1. Dilantin 200 mg b.i.d.  2. Prozac 20 mg per day.  3. Suboxone 2 mg a day for one more day for detox. 4. Clonidine 0.2 mg patch weekly for opiate withdrawal. 5. Robaxin 750 mg q.4 hours p.r.n. for pain.   ALLERGIES: Depakote and Toradol.   ASSESSMENT: 34 year old man with major depression a78nd opiate dependence came into the hospital for opiate detox and also because of suicidal ideation. Course of treatment was  interrupted by a seizure. Fortunately, he seems to have not suffered any more long-standing damage from having had the seizure and now has been getting his Dilantin regularly. Patient continues to be depressed and needs psychiatric hospitalization because of suicidal thinking.   TREATMENT PLAN: Continue current medications. Finish taper with Suboxone in the next day. Decrease and then do away with medicines for opiate withdrawal. Continue to engage him in groups and activities on the unit and monitor for changes in mood. Continue Prozac 20 mg per day.   DIAGNOSIS PRINCIPLE AND PRIMARY:  AXIS I: Major depressive episode, severe, recurrent.   SECONDARY DIAGNOSES:  AXIS I: Opiate dependence.   AXIS II: Deferred.   AXIS III: Seizure disorder.   AXIS IV: Severe from social isolation and death of a child six months ago.   AXIS V: Functioning at time of assessment 37.  ____________________________ Audery AmelJohn T. Clapacs, MD jtc:cms D: 02/18/2012 11:42:13 ET T: 02/18/2012 11:53:19 ET JOB#: [id number]  cc: Audery AmelJohn T. Clapacs, MD, <Dictator> Audery AmelJOHN T CLAPACS MD ELECTRONICALLY SIGNED 02/19/2012 9:37

## 2014-08-23 NOTE — H&P (Signed)
PATIENT NAME:  [name] [name], [name] [name] MR#:  [id number] DATE OF BIRTH:  02-28-1981  DATE OF ADMISSION:  02/14/2012  IDENTIFYING INFORMATION AND CHIEF COMPLAINT: 34 year old man transferred under involuntary commitment from an outside hospital for treatment of depression with suicidal ideation and opiate dependence.   CHIEF COMPLAINT: "Depression and opiate abuse."   HISTORY OF PRESENT ILLNESS: The patient presented to an outside hospital with suicidal ideation after having an epileptic seizure. He reported ongoing depression which had not been getting any treatment. He describes for me that he has had several months of progressively worsening depressed mood, hopelessness, lack of enjoyment of normal activities, decreased concentration, inability to function at work or in social activities, impaired sleep and appetite and suicidal ideation with serious thoughts of overdosing. The patient's acute stressor was the death of his child in an automobile accident over the summer. Since then he has been feeling increasingly depressed. Additionally, the patient has a long history of many years of opiate dependence. He is using Opana about 160 to 200 mg per day and has been using at that level up until the day prior to admission. The patient denies that he has been abusing other drugs. He says he is tired of his long history of narcotic abuse and would like to be detoxed from it.   PAST PSYCHIATRIC HISTORY: No previous treatment for depression. He says he had one psychiatric hospitalization at [facility] about 12 years ago when his mother died of suicide. He was briefly treated with Paxil at that time but did not continue on it and does not recall whether he has had any response. He denies any history of suicide attempts. He has not had any psychiatric treatment in the last 10 to 12 years. The patient also has opiate dependence and says he has been using opiates heavily for many years. He has tried to stop on  his own, but has never been able to complete the detox and has never been into a residential or even a detox treatment facility.   SUBSTANCE ABUSE HISTORY: See above. He denies that he abuses alcohol. Says that he has used some other drugs such as marijuana and cocaine occasionally in the past but his primary drug of abuse is narcotics.   PAST MEDICAL HISTORY: The patient has epilepsy since he was a child. He takes Dilantin which he says does a good job of controlling his seizures when he is compliant with it, but recently he had been noncompliant possibly because of his depression.   FAMILY HISTORY: Mother died of suicide and also had drug abuse problems and bipolar disorder.   SOCIAL HISTORY: The patient lives alone. He is not in an ongoing relationship. His mother is deceased as is his stepfather. He is estranged from his biological father. He maintains a good relationship with his sister who is his closest relative. He says he has multiple friends but many of them are also drug abusers. He works regularly and likes his job, but recently has not been able to perform appropriately because of his depression.   CURRENT MEDICATIONS: Dilantin 200 mg twice a day.   ALLERGIES: Depakote and Toradol.   REVIEW OF SYSTEMS: Depressed mood, suicidal ideation, fatigue, lack of concentration, hopelessness, decreased interest in normal activities. Also, having rhinorrhea and pain related to opiate withdrawal.   MENTAL STATUS EXAM: Casually dressed, neatly groomed young man appropriate in his interaction. Good eye contact. Normal psychomotor activity. Speech is normal in rate, tone, and volume. Affect is  blunted and somewhat anxious. Mood is stated as being depressed. Thoughts are lucid with no loosening of associations or delusional thinking. Denies auditory or visual hallucinations. Has passive suicidal thoughts with no immediate intent in the hospital. No homicidal ideation. Speech is decreased in total amount,  normal in tone. Insight and judgment adequate. Intelligence normal. Short and long-term memory grossly intact.   PHYSICAL EXAMINATION:  GENERAL: The patient presents as a normally built young man who looks his stated age. No evident physical distress other than rhinorrhea and nasal congestion and some gooseflesh on his arms.   EYES: His pupils are equal and reactive. Face is symmetric.   HEAD: Symmetric.   SKIN: No other acute skin lesions.   NECK AND BACK: Nontender.   MUSCULOSKELETAL: Full range of motion at all extremities. Strength and reflexes normal and symmetric.   LUNGS: Clear to auscultation.   HEART: Regular rate and rhythm.   ABDOMEN: Soft, nontender, normal bowel sounds. Normal gait.   VITAL SIGNS: Most recent temperature 98.2, pulse 94, respirations 18, blood pressure 133/79.   ASSESSMENT: This is a 34 year old man with major depression and opiate dependence. Requires hospitalization because of suicidal ideation and inability to stop using drugs outside the hospital.   TREATMENT PLAN:  1. He will be continued on his Dilantin for epilepsy. I will test a level in a couple of days.  2. I am going to use a clonidine patch, as well as Robaxin, Lomotil and Zofran p.r.n. for treatment of opiate withdrawal symptoms.  3. He will be engaged in appropriate substance abuse treatment including individual and group therapy and counseling about longer term treatment.  4. For his depression, I suggest starting Prozac 20 mg a day as well as daily individual and group therapy.   DIAGNOSIS PRINCIPLE AND PRIMARY:  AXIS I: Major depressive episode, severe, single.   SECONDARY DIAGNOSES:  AXIS I: Opiate dependence.   AXIS II: Deferred.   AXIS III: Epilepsy.   AXIS IV: Severe stress from recent death of a child.   AXIS V: Functioning at time of evaluation 32.   ____________________________ Audery AmelJohn T. Clapacs, MD jtc:ap D: 02/15/2012 03:19:28 ET T: 02/15/2012 08:06:26  ET JOB#: [id number]  cc: Audery AmelJohn T. Clapacs, MD, <Dictator> Audery AmelJOHN T CLAPACS MD ELECTRONICALLY SIGNED 02/17/2012 10:14

## 2014-08-23 NOTE — Consult Note (Signed)
Details:    - Psychiatry: Update - at aptient's request will give the one time shot of dilaudid now while waiting for vascular surg tetam to do pic line. Patient rrealizes this is still just one time shot.   Electronic Signatures: Clapacs, JAudery Amelohn T (MD)  (Signed 12-Oct-13 21:27)  Authored: Details   Last Updated: 12-Oct-13 21:27 by Audery Amellapacs, John T (MD)

## 2016-07-26 NOTE — Case Communication (Signed)
CM Discharge Planning Assessment - Text       CM Discharge Planning Assessment Entered On:  07/26/2016 10:57 EDT    Performed On:  07/26/2016 10:38 EDT by Georgana CurioMalone,  Cristina Dawn               Home Environment   Living Environment :   No Living Environment Information Available     Affect/Behavior :   Appropriate   Lives With :   Family   Living Situation :   Home independently   Eugenio HoesMalone,  Cristina Dawn - 07/26/2016 10:38 EDT   Discharge Needs I   Previously Documented Discharge Needs :   DISCHARGE PLAN/NEEDS:No discharge data available.  EQUIPMENT/TREATMENT NEEDS:No discharge data available.     Previously Documented Benefits Information :   No discharge data available.     CM Progress Note :   07/26/16 - cdm - Pt presents to ED: seizure. While getting work up, he expressed SI to staff. Dr. Littie DeedsGentry met w/ pt. Pt gave him very limited info, and hesitated to provide emergency contact, but eventually gave his friend's name/number, Emmit Eagle [phone], who [place] in [place], [place]. SW called Emmit. Emmit states that they were friends and they last spoke 5-6 wks ago. SW inquired as to what ended the friendship and he said, '1700 hospital visits.' Emmit then put his mom, Fonda KinderMakayla, on the phone to provide further information on pt. Kem KaysMakayla Eagle reports that her son and pt met and became friends. 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 Fonda KinderMakayla confirms that pt has been in/out of the hospital numerous times & has witnessed his seizures. Although physicians in [place] would provide him w/ medication, and after a caseworker was able to help him get [place] Medicaid, pt was never compliant with medications and would continue to have seizures. Eventually, Emmit and Makayla became suspect of pt's behavior. Makayla described it as being 'manipulative.' She said he was able to scame her for money. Pt would make suicidal statements or use his seizure d/o for some type of gain and/or attention. He also demonstrated pill  seeking behavior while in the hospital and has a tendency to threaten to leave AMA when only given Ibuprofen. Makayla questioned if pt was perhaps faking seizures and if it were possible to do so. She reports that pt told them his fiancee died and his 34 y/o passed away 4 yrs ago. She came to the point to where she wasn't sure what was true anymore in regards to his history. Makayla reports he has burned bridges with many family members. She reports that pt told her he overdosed on Dilantin but, again, she is unsure if this actually happened. He spent 2 1/2 yrs in prison. His friend, Emmit, would sleep overnight at the hospital when he was admitted. When Emmit would go home to rest, pt would continuously call him and make him feel guilty a/b leaving  him there. The [place] family finally decided to set boundaries and asked Emmit to leave. After that, Emmit once called Makayla 30xs in one day. SW will attempt to find psych placement for pt.      Georgana CurioMalone,  Cristina [facility] - 07/26/2016 10:38 EDT   Discharge Needs II   Discharge Planning Time Spent :   60 minutes   Georgana CurioMalone,  Cristina [facility] - 07/26/2016 10:38 EDT

## 2016-07-26 NOTE — Nursing Note (Signed)
Adult Patient History Form-Text       Adult Patient History Entered On:  07/26/2016 16:16 EDT    Performed On:  07/26/2016 15:58 EDT by Roseanne Reno, RN, Lucretia Field               General Info   Patient Identified 2018 :   Identification band, Verbal   Patient Identified :   Julian Villanueva   Information Given By 2018 :   Self   Pregnancy Status :   N/A   Has the patient received chemotherapy or biotherapy within the last 48 hours? :   No   In Clinical Trial With Signed Consent for Related Condition :   No signed consent for clinical trial   Is the patient currently (2-3 days) receiving radiation treatment? :   No   STEWART, RN, MILENA C - 07/26/2016 15:58 EDT   Allergies   (As Of: 07/26/2016 16:16:38 EDT)   Allergies (Active)   Depakote  Estimated Onset Date:   Unspecified ; Reactions:   ammonia level ; Created By:   Channing Mutters, RN, MICHAEL F; Reaction Status:   Active ; Category:   Drug ; Substance:   Depakote ; Type:   Allergy ; Severity:   Moderate ; Updated By:   Quintin Alto; Reviewed Date:   07/26/2016 15:59 EDT      morphine  Estimated Onset Date:   Unspecified ; Reactions:   hives ; Created By:   Channing Mutters RN, MICHAEL F; Reaction Status:   Active ; Category:   Drug ; Substance:   morphine ; Type:   Allergy ; Severity:   Moderate ; Updated By:   Quintin Alto; Reviewed Date:   07/26/2016 15:59 EDT        Medication History   Medication List   (As Of: 07/26/2016 16:16:38 EDT)   Normal Order    heparin flush 100 units/mL IV Soln 5 mL Syringe  :   heparin flush 100 units/mL IV Soln 5 mL Syringe ; Status:   Completed ; Ordered As Mnemonic:   heparin flush ; Simple Display Line:   500 units, 5 mL, Intracath, Once ; Ordering Provider:   FEINGOLD-MD,  STEVEN A; Catalog Code:   heparin flush ; Order Dt/Tm:   07/25/2016 23:18:47          LORazepam 2 mg/mL Inj Soln 1 mL  :   LORazepam 2 mg/mL Inj Soln 1 mL ; Status:   Completed ; Ordered As Mnemonic:   Ativan ; Simple Display Line:   1 mg, 0.5 mL, IV Push, Once ; Ordering  Provider:   FEINGOLD-MD,  STEVEN A; Catalog Code:   LORazepam ; Order Dt/Tm:   07/25/2016 20:47:52          LORazepam 2 mg/mL Inj Soln 1 mL  :   LORazepam 2 mg/mL Inj Soln 1 mL ; Status:   Discontinued ; Ordered As Mnemonic:   Ativan ; Simple Display Line:   2 mg, 1 mL, IV Push, Once ; Ordering Provider:   FEINGOLD-MD,  STEVEN A; Catalog Code:   LORazepam ; Order Dt/Tm:   07/25/2016 20:46:01          LORazepam 2 mg/mL Inj Soln 1 mL  :   LORazepam 2 mg/mL Inj Soln 1 mL ; Status:   Completed ; Ordered As Mnemonic:   Ativan ; Simple Display Line:   1 mg, 0.5 mL, IV Push, Once ; Ordering Provider:   Delena Serve  A; Catalog Code:   LORazepam ; Order Dt/Tm:   07/25/2016 20:45:22          Sodium Chloride 0.9% intravenous solution Bolus  :   Sodium Chloride 0.9% intravenous solution Bolus ; Status:   Completed ; Ordered As Mnemonic:   Sodium Chloride 0.9% bolus ; Simple Display Line:   1,000 mL, 1000 mL/hr, IV Piggyback, Once ; Ordering Provider:   FEINGOLD-MD,  STEVEN A; Catalog Code:   Sodium Chloride 0.9% ; Order Dt/Tm:   07/25/2016 19:59:04            Home Meds    rivaroxaban  :   rivaroxaban ; Status:   Documented ; Ordered As Mnemonic:   Xarelto 20 mg oral tablet ; Simple Display Line:   20 mg, 1 tabs, Oral, qPM, 30 tabs, 0 Refill(s) ; Catalog Code:   rivaroxaban ; Order Dt/Tm:   07/25/2016 17:41:09          levETIRAcetam  :   levETIRAcetam ; Status:   Documented ; Ordered As Mnemonic:   levETIRAcetam 750 mg oral tablet, dispersible ; Simple Display Line:   1,500 mg, 2 tabs, Oral, BID, 0 Refill(s) ; Catalog Code:   levETIRAcetam ; Order Dt/Tm:   07/25/2016 17:40:19          phenyTOIN  :   phenyTOIN ; Status:   Documented ; Ordered As Mnemonic:   Phenytek 300 mg oral capsule, extended release ; Simple Display Line:   300 mg, 1 cap, Oral, At Bedtime (Once a Day), 30 cap, 0 Refill(s) ; Catalog Code:   phenyTOIN ; Order Dt/Tm:   07/25/2016 17:40:52            Problem History   (As Of: 07/26/2016 16:16:38 EDT)    Problems(Active)    Epilepsy (IMO  :96295 )  Name of Problem:   Epilepsy ; Recorder:   GOSNELL, RN, MICHAEL F; Confirmation:   Confirmed ; Classification:   Medical ; Code:   [phone] ; Contributor System:   PowerChart ; Last Updated:   07/25/2016 17:39 EDT ; Life Cycle Date:   07/25/2016 ; Life Cycle Status:   Active ; Vocabulary:   IMO        Protein C deficiency (IMO  :[id number] )  Name of Problem:   Protein C deficiency ; Recorder:   Channing Mutters, RN, MICHAEL F; Confirmation:   Confirmed ; Classification:   Medical ; Code:   [phone] ; Contributor System:   Dietitian ; Last Updated:   07/25/2016 17:39 EDT ; Life Cycle Date:   07/25/2016 ; Life Cycle Status:   Active ; Vocabulary:   IMO          Diagnoses(Active)    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 ; Diagnosis Type:   Discharge ; Confirmation:   Confirmed ; Clinical Dx:   Munchausen's syndrome ; Classification:   Medical ; Clinical Service:   Non-Specified ; Code:   ICD-10-CM ; Probability:   0 ; Diagnosis Code:   F68.10      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 ; Diagnosis Type:   Discharge ; Confirmation:   Confirmed ; Clinical Dx:   Pseudoseizures ; Classification:   Medical ; Clinical Service:   Non-Specified ; Code:   ICD-10-CM ; Probability:   0 ; Diagnosis Code:   F44.5      Seizure  Date:   07/25/2016 ; Diagnosis Type:   Reason For Visit ; Confirmation:   Complaint of ; Clinical  Dx:   Seizure ; Classification:   Medical ; Clinical Service:   Emergency medicine ; Code:   PNED ; Probability:   0 ; Diagnosis Code:   [id number]      Suicidal ideation  Date:   07/25/2016 ; Diagnosis Type:   Discharge ; Confirmation:   Confirmed ; Clinical Dx:   Suicidal ideation ; Classification:   Medical ; Clinical Service:   Non-Specified ; Code:   ICD-10-CM ; Probability:   0 ; Diagnosis Code:   R45.851        Procedure History        -    Procedure History   (As Of: 07/26/2016 16:16:39 EDT)     Anesthesia Minutes:   0 ; Procedure Name:   Stent ; Procedure  Minutes:   0            Anesthesia Minutes:   0 ; Procedure Name:   [place] filter ; Procedure Minutes:   0            Immunizations   Influenza Vaccine Status :   Patient refused   Last Tetanus :   Unknown   STEWART, RN, MILENA C - 07/26/2016 15:58 EDT   ID Risk Screen   Chills :   No   Cough (Any Duration) :   No   Fever :   No   Hemoptysis (Blood in Sputum) :   No   Night Sweats :   No   Weight Loss Greater Than 10 Pounds :   No   Hx of TB Now or at Any Time In the Past (Even if on Meds) :   No   Foreign-Born :   No   Homeless or In Shelter :   No   Incarcerated Within Last 2 Years :   No   Intravenous Drug User :   No   Male Homosexual :   No   New TST/IGRA Results Pos(Within 2 yrs),Hx Recent TB Exposure :   No   STEWART, RN, MILENA C - 07/26/2016 15:58 EDT   3 or more loose/watery stools :   No   MRSA/VRE Screening :   None of these apply   Patient Recent Travel History :   No recent travel   Family Member Travel History :   No recent travel   [place], RN, [place] C - 07/26/2016 15:58 EDT   Infectious Disease Risk Factor Grid   Chills :   No   Fever :   No   Fatigue :   No   Headache :   No   Runny or Stuffy Nose :   No   Sore Throat :   No   Difficulty Breathing :   No   Shortness of Breath :   No   New or Worsening Cough :   No   Wheezing :   No   Vomiting :   No   Diarrhea :   No   Abdominal (Stomach Pain) :   No   Muscle Pain :   No   Weakness/Numbness :   No   Recent Exposure to Communicable Disease :   No   Illness With Generalized Rash :   No   Abnormal Bleeding :   No   Unexplained Hemorrhage (Bleeding or Bruising) :   No   Arthralgia :   No   Conjunctivitis :   No   STEWART, RN, MILENA C -  07/26/2016 15:58 EDT   Bloodless Medicine   Will Patient Accept Blood Transfusion and/or Blood Products :   Yes   STEWART, RN, MILENA C - 07/26/2016 15:58 EDT   Nutrition   Nutritional Risk Factors :   None   Usual Weight :   80 kg(Converted to: 176 lb 6 oz, 176.37 lb, 2,821.92 oz)    Unintentional Weight Change Greater  Than 10 lbs in the Last 6 Months :   No   Roseanne Reno RN, MILENA C - 07/26/2016 15:58 EDT   Functional   Sensory Deficits :   None   ADLs Prior to Admission :   Independent   Payton Spark - 07/26/2016 15:58 EDT   Social History   Social History   (As Of: 07/26/2016 16:16:39 EDT)   Tobacco:        Former smoker, 2 year(s).   (Last Updated: 07/26/2016 16:11:41 EDT by Roseanne Reno, RN, MILENA C)          Alcohol:        Denies   (Last Updated: 07/26/2016 16:11:53 EDT by Roseanne Reno, RN, MILENA C)          Substance Abuse:        Denies   (Last Updated: 07/26/2016 16:12:16 EDT by Roseanne Reno, RN, MILENA C)            Spiritual   Faith/Denomination :   Spiritual but not religious   Do you have a concern that you would like to address with a Chaplain? :   No   Do you have any religious/spiritual/cultural beliefs that could impact the way your care is provided? :   No   STEWART, RN, MILENA C - 07/26/2016 15:58 EDT   Harm Screen   Feels Unsafe at Home :   No   Recent Life Events :   None   Family Behavioral Health History :   None   Symptoms of Psychosis Present :   None   Suicidal Behavior :   None   Self Harming Behavior :   None   Suicidal Ideation :   None   STEWART, RN, MILENA C - 07/26/2016 15:58 EDT   Advance Directive   Advance Directive :   No   Patient Wishes to Receive Further Information on Advance Directives :   No   Payton Spark - 07/26/2016 15:58 EDT   Education   Written Language :   [place]   Primary Language :   Charolett Bumpers, RN, Lucretia Field - 07/26/2016 15:58 EDT   Caregiver/Advocate Language   Patient :   Verbal explanation   STEWART, RN, Suella Broad C - 07/26/2016 15:58 EDT   Barriers to Learning :   None evident   Teaching Method :   Explanation, Printed materials   STEWART, RN, Suella Broad C - 07/26/2016 15:58 EDT   DC Needs   Living Situation :   Home independently   Anticipated Discharge Needs :   None   Roseanne Reno RN, Suella Broad C - 07/26/2016 15:58 EDT   Valuables and Belongings   Does Patient Have Valuables and  Belongings :   Yes   Roseanne Reno RN, MILENA C - 07/26/2016 15:58 EDT   Admission Complete   Admission Complete :   Yes   Roseanne Reno RN, MILENA C - 07/26/2016 15:58 EDT

## 2016-07-27 NOTE — Discharge Summary (Signed)
 Inpatient Clinical Summary             [facility]  Post-Acute Care Transfer Instructions  PERSON INFORMATION   Name: Julian Villanueva, Julian Villanueva  MRN: [id number]    FIN#: [id number]   PHYSICIANS  Admitting Physician: LAVONE RUSH D  Attending Physician: LAVONE RUSH D   PCP: Pcp, None  Discharge Diagnosis:  Munchausen's syndrome; Pseudoseizures; Suicidal ideation  Comment:       PATIENT EDUCATION INFORMATION  Instructions:             Suicidal Feelings: How to Help Yourself  Medication Leaflets:               Follow-up:                          With: Address: When:   [facility] [address] [place], [place]  [address]  [phone] Business (1) Within 1 week   Comments:   Avoid alcohol and other drugs. See your psychiatric and other doctors.  Return for thoughts of hurting yourself or others, lethargy, confusion, severe depression, or other concerns.                           MEDICATION LIST  Medication Reconciliation at Discharge:         New Medications  Printed Prescriptions  FLUoxetine (PROzac 20 mg oral capsule) 1 Capsules Oral (given by mouth) every day for 30 Days. Refills: 0.  Last Dose:____________________  Medications that have not changed  Other Medications  levETIRAcetam (levETIRAcetam 750 mg oral tablet, dispersible) 2 Tabs Oral (given by mouth) 2 times a day.  Last Dose:____________________  No Longer Take the Following Medications  phenyTOIN (Phenytek 300 mg oral capsule, extended release) 1 Capsules Oral (given by mouth) At Bedtime (Once a Day).  Stop Taking Reason: Physician Request  rivaroxaban (Xarelto 20 mg oral tablet) 1 Tabs Oral (given by mouth) once a day (in the evening).  Stop Taking Reason: Physician Request         Patient's Final Home Medication List Upon Discharge:          FLUoxetine (PROzac 20 mg oral capsule) 1 Capsules Oral (given by mouth) every day for 30 Days. Refills: 0.  levETIRAcetam (levETIRAcetam 750 mg oral tablet, dispersible) 2 Tabs  Oral (given by mouth) 2 times a day.         Comment:       ORDERS         Order Name Order Details   Discharge Patient 07/27/16 9:20:00 EDT

## 2016-07-27 NOTE — Discharge Summary (Signed)
Inpatient Patient Summary       ;        [facility]  [address]  [place], [place] [address]  [phone]  Patient Discharge Instructions     Name: Julian Villanueva, Julian Villanueva  Current Date: 07/27/2016 09:29:52  DOB: Oct 04, 1980 MRN: [id number] FIN: [id number]  Patient Address: [address] [facility]  [address]  Patient Phone: [phone]  Primary Care Provider:  Name: Pcp, None  Phone:    Immunizations Provided:       Discharge Diagnosis: Munchausen's syndrome; Pseudoseizures; Suicidal ideation  Discharged To: TO, ANTICIPATED%>  Home Treatments: TREATMENTS, ANTICIPATED%>  Devices/Equipment: EQUIPMENT REHAB%>  Post Hospital Services: HOSPITAL SERVICES%>  Professional Skilled Services: SKILLED SERVICES%>  Special Services and Community Resources:               SERV AND COMM RES, ANTICIPATED%>  Mode of Discharge Transportation: TRANSPORTATION%>  Discharge Orders         Discharge Patient 07/27/16 9:20:00 EDT         Comment:      Medications   During the course of your visit, your medication list was updated with the most current information. The details of those changes are reflected below:         New Medications  Printed Prescriptions  FLUoxetine (PROzac 20 mg oral capsule) 1 Capsules Oral (given by mouth) every day for 30 Days. Refills: 0.  Last Dose:____________________  Medications that have not changed  Other Medications  levETIRAcetam (levETIRAcetam 750 mg oral tablet, dispersible) 2 Tabs Oral (given by mouth) 2 times a day.  Last Dose:____________________  No Longer Take the Following Medications  phenyTOIN (Phenytek 300 mg oral capsule, extended release) 1 Capsules Oral (given by mouth) At Bedtime (Once a Day).  Stop Taking Reason: Physician Request  rivaroxaban (Xarelto 20 mg oral tablet) 1 Tabs Oral (given by mouth) once a day (in the evening).  Stop Taking Reason: Physician Request         [facility] would like to thank you for allowing [place] to assist you with your healthcare  needs. The following includes patient education materials and information regarding your injury/illness.     Dry, Julian Villanueva has been given the following list of follow-up instructions, prescriptions, and patient education materials:  Follow-up Instructions:             With: Address: When:   [facility] [address] [place], [place]  [address]  [phone] Business (1) Within 1 week   Comments:   Avoid alcohol and other drugs. See your psychiatric and other doctors.  Return for thoughts of hurting yourself or others, lethargy, confusion, severe depression, or other concerns.                  It is important to always keep an active list of medications available so that you can share with other providers and manage your medications appropriately. As an additional courtesy, we are also providing you with your final active medications list that you can keep with you.           FLUoxetine (PROzac 20 mg oral capsule) 1 Capsules Oral (given by mouth) every day for 30 Days. Refills: 0.  levETIRAcetam (levETIRAcetam 750 mg oral tablet, dispersible) 2 Tabs Oral (given by mouth) 2 times a day.      Take only the medications listed above. Contact your doctor prior to taking any medications not on this list.  Discharge instructions, if any, will display below     Instructions for Diet: INSTRUCTIONS FOR DIET%>   Instructions for Supplements: SUPPLEMENT INSTRUCTIONS%>   Instructions for Activity: INSTRUCTIONS FOR ACTIVITY%>   Instructions for Wound Care: INSTRUCTIONS FOR WOUND CARE%>     Medication leaflets, if any, will display below     Patient education materials, if any, will display below     Suicidal Feelings: How to Help Yourself    Suicide is the taking of one's own life. If you feel as though life is getting too tough to handle and are thinking about suicide, get help right away. To get help:     Call your local emergency services (911 in the [place]     Call a suicide hotline to speak  with a trained counselor who understands how you are feeling. The following is a list of suicide hotlines in the [place]. For a list of hotlines in [place], visit InkDistributor.it.    ? ?1-800-273-TALK 602 549 5493).    ? ?1-800-SUICIDE 330-251-8839).    ? ?904-192-8241. This is a hotline for Spanish speakers.    ? ?1-800-799-4TTY 901-241-3358). This is a hotline for TTY users.    ? ?1-866-4-U-TREVOR (458) 778-8875). This is a hotline for lesbian, gay, bisexual, transgender, or questioning youth.     Contact a crisis center or a local suicide prevention center. To find a crisis center or suicide prevention center:    ? Call your local hospital, clinic, community service organization, mental health center, social service provider, or health department. Ask for assistance in connecting to a crisis center.    ? Visit https://www.patel-king.com/ for a list of crisis centers in the [place], or visit www.suicideprevention.ca/thinking-about-suicide/find-a-crisis-centre for a list of centers in [place].     Visit the following websites:    ? ?National Suicide Prevention Lifeline: www.suicidepreventionlifeline.org    ? ?Hopeline: www.hopeline.com    ? ?[employer] for Suicide Prevention: https://www.ayers.com/    ? ?[employer] (for lesbian, gay, bisexual, transgender, or questioning youth): www.thetrevorproject.org    HOW CAN I HELP MYSELF FEEL BETTER?     Promise yourself that you will not do anything drastic when you have suicidal feelings. Remember, there is hope. Many people have gotten through suicidal thoughts and feelings, and you will, too. You may have gotten through them before, and this proves that you can get through them again.     Let family, friends, teachers, or counselors know how you are feeling. Try not to isolate yourself from those who care about you. Remember, they will want to help you. Talk with  someone every day, even if you do not feel sociable. Face-to-face conversation is best.     Call a mental health professional and see one regularly.     Visit your primary health care provider every year.     Eat a well-balanced diet, and space your meals so you eat regularly.     Get plenty of rest.     Avoid alcohol and drugs, and remove them from your home. They will only make you feel worse.     If you are thinking of taking a lot of medicine, give your medicine to someone who can give it to you one day at a time. If you are on antidepressants and are concerned you will overdose, let your health care provider know so he or she can give you safer medicines. Ask your mental health professional about the possible side effects of any medicines you  are taking.     Remove weapons, poisons, knives, and anything else that could harm you from your home.     Try to stick to routines. Follow a schedule every day. Put self-care on your schedule.     Make a list of realistic goals, and cross them off when you achieve them. Accomplishments give a sense of worth.     Wait until you are feeling better before doing the things you find difficult or unpleasant.     Exercise if you are able. You will feel better if you exercise for even a half hour each day.     Go out in the sun or into nature. This will help you recover from depression faster. If you have a favorite place to walk, go there.     Do the things that have always given you pleasure. Play your favorite music, read a good book, paint a picture, play your favorite instrument, or do anything else that takes your mind off your depression if it is safe to do.     Keep your living space well lit.     When you are feeling well, write yourself a letter about tips and support that you can read when you are not feeling well.     Remember that life's difficulties can be sorted out with help. Conditions can be treated. You can work on thoughts and strategies that serve you well.     This information is not intended to replace advice given to you by your health care provider. Make sure you discuss any questions you have with your health care provider.    Document Released: 10/27/2002 Document Revised: 05/13/2014 Document Reviewed: 08/17/2013  Elsevier Interactive Patient Education ?2016 [employer]vier Inc.             IS IT A STROKE? Act FAST and Check for these signs:    FACE                         Does the face look uneven?    ARM                         Does one arm drift down?    SPEECH                    Does their speech sound strange?    TIME                         Call 9-1-1 at any sign of stroke  ---------------------------------------------------------------------------------------------------------------------------  Heart Attack Signs  Chest discomfort: Most heart attacks involve discomfort in the center of the chest and lasts more than a few minutes, or goes away and comes back. It can feel like uncomfortable pressure, squeezing, fullness or pain.  Discomfort in upper body: Symptoms can include pain or discomfort in one or both arms, back, neck, jaw or stomach.  Shortness of breath: With or without discomfort.  Other signs: Breaking out in a cold sweat, nausea, or lightheaded.  Remember, MINUTES DO MATTER. If you experience any of these heart attack warning signs, call 9-1-1 to get immediate medical attention!     ---------------------------------------------------------------------------------------------------------------------------  [facility] allows you to manage your health, view your test results, and retrieve your discharge documents from your hospital stay securely and conveniently from your computer.  To begin the enrollment process, visit [email or web address] Click on "  Sign up now" under [facility].   Yes - Patient/Family/Caregiver demonstrates understanding of instructions given        ______________________________                                  ___________________    Patient/Family/ Caregiver Signature                                                           Date/Time     ______________________________                                 ___________________    Provider Signature                                                                                         Date/Time

## 2016-07-27 NOTE — Nursing Note (Signed)
Nursing Discharge Summary - Text       Nursing Discharge Summary Entered On:  07/27/2016 11:21 EDT    Performed On:  07/27/2016 11:18 EDT by Roseanne RenoSTEWART, RN, MILENA C               DC Information   Discharge To, Anticipated :   Home independently   Professional Skilled Services, Anticipated :   No Needs   Special Services and Community Resources :   M[facility] - [place]   Transportation :   Other: BUS   Accompanied By :   None   Mode of Discharge :   Ambulatory   STEWART, RN, Lucretia FieldMILENA C - 07/27/2016 11:18 EDT   Education   Responsible Learner(s) :   Living Situation: Home independently        Performed by: Jon BillingsSTEWART, RN, MILENA C - 07/26/2016 15:58     Barriers To Learning :   None evident   Teaching Method :   Explanation, Printed materials   EdnaSTEWART, RN, Suella BroadMILENA C - 07/27/2016 11:18 EDT   Post-Hospital Education Adult Grid   Community Resources :   [employer] Squ[employer]balizes understanding   Diagnostic Results :   TEFL teacherVerbalizes understanding   Importance of Follow-Up Visits :   TEFL teacherVerbalizes understanding   Substance Abuse :   Verbalizes understanding   When to Call Health Care Provider :   [employer] Squ[employer]bVerbalizes understanding   Payton SparkSTEWART, RN, MILENA C - 07/27/2016 11:18 EDT   Health Maintenance Education Adult Grid   Allergies :   Verbalizes understanding   Bathing/Hygiene :   TEFL teacherVerbalizes understanding   Diet/Nutrition :   TEFL teacherVerbalizes understanding   Exercise :   TEFL teacherVerbalizes understanding   Immunizations :   Teacher, adult educationVerbalizes understanding   STEWART, RN, [employer]MILENA C - 07/27/2016 11:18 EDT   Medication Education Adult Grid   Drug to Drug Interactions :   Verbalizes understanding   Med Dosage, Route, Scheduling :   TEFL teacherVerbalizes understanding   Med Generic/Brand Name, Purpose, Action :   Verbalizes understanding   Medication Precautions :   IT sales professionalVerbalizes understanding   Safety, Medication :   Verbalizes understanding   STEWART, RN, [employer] EnergyMILENA C - 07/27/2016 11:18 EDT   Time Spent Educating Patient :   20 minutes   Roseanne RenoSTEWART, RN, MILENA C - 07/27/2016 11:18 EDT
